# Patient Record
Sex: Female | Born: 1955 | Race: Black or African American | Hispanic: No | Marital: Married | Smoking: Never smoker
Health system: Southern US, Community
[De-identification: ages and names within clinical notes are randomized; demographics above are authoritative.]

## PROBLEM LIST (undated history)

## (undated) DIAGNOSIS — E785 Hyperlipidemia, unspecified: Secondary | ICD-10-CM

## (undated) DIAGNOSIS — I10 Essential (primary) hypertension: Secondary | ICD-10-CM

## (undated) DIAGNOSIS — Z8041 Family history of malignant neoplasm of ovary: Secondary | ICD-10-CM

## (undated) DIAGNOSIS — D869 Sarcoidosis, unspecified: Secondary | ICD-10-CM

## (undated) DIAGNOSIS — A6000 Herpesviral infection of urogenital system, unspecified: Secondary | ICD-10-CM

## (undated) DIAGNOSIS — K589 Irritable bowel syndrome without diarrhea: Secondary | ICD-10-CM

## (undated) DIAGNOSIS — Z1371 Encounter for nonprocreative screening for genetic disease carrier status: Secondary | ICD-10-CM

## (undated) DIAGNOSIS — Z803 Family history of malignant neoplasm of breast: Secondary | ICD-10-CM

## (undated) HISTORY — DX: Essential (primary) hypertension: I10

## (undated) HISTORY — DX: Irritable bowel syndrome without diarrhea: K58.9

## (undated) HISTORY — DX: Herpesviral infection of urogenital system, unspecified: A60.00

## (undated) HISTORY — PX: BACK SURGERY: SHX140

## (undated) HISTORY — PX: BUNIONECTOMY: SHX129

## (undated) HISTORY — DX: Family history of malignant neoplasm of breast: Z80.3

## (undated) HISTORY — PX: VAGINAL HYSTERECTOMY: SUR661

## (undated) HISTORY — PX: FOOT SURGERY: SHX648

## (undated) HISTORY — DX: Hyperlipidemia, unspecified: E78.5

## (undated) HISTORY — DX: Encounter for nonprocreative screening for genetic disease carrier status: Z13.71

## (undated) HISTORY — DX: Family history of malignant neoplasm of ovary: Z80.41

---

## 2005-04-28 ENCOUNTER — Ambulatory Visit: Payer: Self-pay | Admitting: Obstetrics and Gynecology

## 2006-06-22 ENCOUNTER — Ambulatory Visit: Payer: Self-pay | Admitting: Obstetrics and Gynecology

## 2006-06-30 ENCOUNTER — Other Ambulatory Visit: Payer: Self-pay

## 2006-07-06 ENCOUNTER — Ambulatory Visit: Payer: Self-pay | Admitting: Obstetrics and Gynecology

## 2006-08-01 ENCOUNTER — Ambulatory Visit: Payer: Self-pay | Admitting: Obstetrics and Gynecology

## 2007-06-21 ENCOUNTER — Ambulatory Visit: Payer: Self-pay | Admitting: Internal Medicine

## 2010-12-22 HISTORY — PX: COLONOSCOPY: SHX5424

## 2012-08-12 ENCOUNTER — Ambulatory Visit: Payer: Self-pay | Admitting: Internal Medicine

## 2012-08-24 ENCOUNTER — Ambulatory Visit: Payer: Self-pay | Admitting: Internal Medicine

## 2013-02-22 ENCOUNTER — Ambulatory Visit: Payer: Self-pay | Admitting: Family Medicine

## 2013-02-22 LAB — URINALYSIS, COMPLETE
Blood: NEGATIVE
Glucose,UR: NEGATIVE mg/dL (ref 0–75)
Ketone: NEGATIVE
Nitrite: NEGATIVE
Ph: 6 (ref 4.5–8.0)
Specific Gravity: 1.025 (ref 1.003–1.030)

## 2013-02-24 LAB — URINE CULTURE

## 2013-10-07 ENCOUNTER — Ambulatory Visit: Payer: Self-pay | Admitting: Family Medicine

## 2014-10-06 ENCOUNTER — Ambulatory Visit: Payer: Self-pay | Admitting: Obstetrics and Gynecology

## 2014-10-13 ENCOUNTER — Ambulatory Visit: Payer: Self-pay | Admitting: Obstetrics and Gynecology

## 2015-01-10 ENCOUNTER — Ambulatory Visit: Payer: Self-pay | Admitting: Family Medicine

## 2015-06-27 ENCOUNTER — Other Ambulatory Visit: Payer: Self-pay | Admitting: Family Medicine

## 2015-06-27 DIAGNOSIS — E785 Hyperlipidemia, unspecified: Secondary | ICD-10-CM

## 2015-06-27 DIAGNOSIS — I10 Essential (primary) hypertension: Secondary | ICD-10-CM

## 2015-06-27 DIAGNOSIS — B001 Herpesviral vesicular dermatitis: Secondary | ICD-10-CM

## 2015-08-01 ENCOUNTER — Other Ambulatory Visit: Payer: Self-pay | Admitting: Family Medicine

## 2015-08-01 DIAGNOSIS — B001 Herpesviral vesicular dermatitis: Secondary | ICD-10-CM

## 2015-08-01 DIAGNOSIS — E785 Hyperlipidemia, unspecified: Secondary | ICD-10-CM

## 2015-08-01 DIAGNOSIS — I10 Essential (primary) hypertension: Secondary | ICD-10-CM

## 2015-08-28 ENCOUNTER — Other Ambulatory Visit: Payer: Self-pay | Admitting: Family Medicine

## 2015-08-28 DIAGNOSIS — E785 Hyperlipidemia, unspecified: Secondary | ICD-10-CM

## 2015-08-28 DIAGNOSIS — Z8619 Personal history of other infectious and parasitic diseases: Secondary | ICD-10-CM

## 2015-08-28 DIAGNOSIS — I10 Essential (primary) hypertension: Secondary | ICD-10-CM

## 2015-09-03 ENCOUNTER — Other Ambulatory Visit: Payer: Self-pay | Admitting: Family Medicine

## 2015-09-18 ENCOUNTER — Ambulatory Visit (INDEPENDENT_AMBULATORY_CARE_PROVIDER_SITE_OTHER): Payer: BLUE CROSS/BLUE SHIELD | Admitting: Family Medicine

## 2015-09-18 ENCOUNTER — Encounter: Payer: Self-pay | Admitting: Family Medicine

## 2015-09-18 VITALS — BP 122/62 | HR 72 | Ht 64.0 in | Wt 155.0 lb

## 2015-09-18 DIAGNOSIS — I10 Essential (primary) hypertension: Secondary | ICD-10-CM | POA: Diagnosis not present

## 2015-09-18 DIAGNOSIS — N3090 Cystitis, unspecified without hematuria: Secondary | ICD-10-CM | POA: Diagnosis not present

## 2015-09-18 DIAGNOSIS — A6000 Herpesviral infection of urogenital system, unspecified: Secondary | ICD-10-CM

## 2015-09-18 DIAGNOSIS — A609 Anogenital herpesviral infection, unspecified: Secondary | ICD-10-CM | POA: Diagnosis not present

## 2015-09-18 DIAGNOSIS — E785 Hyperlipidemia, unspecified: Secondary | ICD-10-CM

## 2015-09-18 MED ORDER — PRAVASTATIN SODIUM 20 MG PO TABS: 20.0000 mg | ORAL_TABLET | Freq: Every day | ORAL | Status: DC

## 2015-09-18 MED ORDER — LISINOPRIL-HYDROCHLOROTHIAZIDE 20-25 MG PO TABS: ORAL_TABLET | ORAL | Status: DC

## 2015-09-18 MED ORDER — VALACYCLOVIR HCL 500 MG PO TABS: 500.0000 mg | ORAL_TABLET | Freq: Every day | ORAL | Status: DC

## 2015-09-18 NOTE — Progress Notes (Signed)
Name: Cheyenne Bean   MRN: [id number]    DOB: 07-20-1956   Date:09/18/2015       Progress Note  Subjective  Chief Complaint  Chief Complaint  Patient presents with  . Hypertension  . Hyperlipidemia  . genital herpes    HPI Comments: followup HSV 2/ suppression therapy  Hypertension This is a chronic problem. The current episode started more than 1 year ago. The problem has been gradually improving since onset. The problem is controlled. Pertinent negatives include no anxiety, blurred vision, chest pain, headaches, malaise/fatigue, neck pain, orthopnea, palpitations, peripheral edema, PND, shortness of breath or sweats. There are no associated agents to hypertension. Risk factors for coronary artery disease include dyslipidemia and post-menopausal state. Past treatments include ACE inhibitors and diuretics. The current treatment provides moderate improvement. There are no compliance problems.  There is no history of angina, kidney disease, CAD/MI, CVA, heart failure, left ventricular hypertrophy, PVD, renovascular disease or retinopathy. There is no history of chronic renal disease.  Hyperlipidemia This is a chronic problem. The current episode started more than 1 year ago. The problem is controlled. Recent lipid tests were reviewed and are normal. She has no history of chronic renal disease, diabetes, hypothyroidism, liver disease, obesity or nephrotic syndrome. Pertinent negatives include no chest pain, focal weakness, myalgias or shortness of breath. Current antihyperlipidemic treatment includes statins. The current treatment provides moderate improvement of lipids. There are no compliance problems.   Urinary Tract Infection  This is a new (follow up) problem. The current episode started in the past 7 days. There has been no fever. Pertinent negatives include no chills, discharge, flank pain, frequency, hematuria, hesitancy, nausea, possible pregnancy, sweats, urgency or vomiting.  Associated symptoms comments: Suprapubic pressure resolved.    No problem-specific assessment & plan notes found for this encounter.   Past Medical History  Diagnosis Date  . Hypertension   . Hyperlipidemia   . Genital herpes     Past Surgical History  Procedure Laterality Date  . Back surgery    . Vaginal hysterectomy    . Bunionectomy Left   . Colonoscopy  2012    cleared for 10 yrs    Family History  Problem Relation Age of Onset  . Hypertension Mother     Social History   Social History  . Marital Status: Married    Spouse Name: N/A  . Number of Children: N/A  . Years of Education: N/A   Occupational History  . Not on file.   Social History Main Topics  . Smoking status: Never Smoker   . Smokeless tobacco: Not on file  . Alcohol Use: No  . Drug Use: No  . Sexual Activity: Yes   Other Topics Concern  . Not on file   Social History Narrative  . No narrative on file    No Known Allergies   Review of Systems  Constitutional: Negative for fever, chills, weight loss and malaise/fatigue.  HENT: Negative for ear discharge, ear pain and sore throat.   Eyes: Negative for blurred vision.  Respiratory: Negative for cough, sputum production, shortness of breath and wheezing.   Cardiovascular: Negative for chest pain, palpitations, orthopnea, leg swelling and PND.  Gastrointestinal: Negative for heartburn, nausea, vomiting, abdominal pain, diarrhea, constipation, blood in stool and melena.  Genitourinary: Negative for dysuria, hesitancy, urgency, frequency, hematuria and flank pain.  Musculoskeletal: Negative for myalgias, back pain, joint pain and neck pain.  Skin: Negative for rash.  Neurological: Negative for dizziness, tingling,  sensory change, focal weakness and headaches.  Endo/Heme/Allergies: Negative for environmental allergies and polydipsia. Does not bruise/bleed easily.  Psychiatric/Behavioral: Negative for depression and suicidal ideas. The patient  is not nervous/anxious and does not have insomnia.      Objective  Filed Vitals:   09/18/15 0835  BP: 122/62  Pulse: 72  Height:  (1.626 m)  Weight: 155 lb (70.308 kg)    Physical Exam  Constitutional: She is well-developed, well-nourished, and in no distress. No distress.  HENT:  Head: Normocephalic and atraumatic.  Right Ear: External ear normal.  Left Ear: External ear normal.  Nose: Nose normal.  Mouth/Throat: Oropharynx is clear and moist.  Eyes: Conjunctivae and EOM are normal. Pupils are equal, round, and reactive to light. Right eye exhibits no discharge. Left eye exhibits no discharge.  Neck: Normal range of motion. Neck supple. No JVD present. No thyromegaly present.  Cardiovascular: Normal rate, regular rhythm, normal heart sounds and intact distal pulses.  Exam reveals no gallop and no friction rub.   No murmur heard. Pulmonary/Chest: Effort normal and breath sounds normal.  Abdominal: Soft. Bowel sounds are normal. She exhibits no mass. There is no tenderness. There is no guarding.  Musculoskeletal: Normal range of motion. She exhibits no edema.  Lymphadenopathy:    She has no cervical adenopathy.  Neurological: She is alert. She has normal reflexes.  Skin: Skin is warm and dry. She is not diaphoretic.  Psychiatric: Mood and affect normal.      Assessment & Plan  Problem List Items Addressed This Visit      Cardiovascular and Mediastinum   Essential hypertension - Primary   Relevant Medications   lisinopril-hydrochlorothiazide (PRINZIDE,ZESTORETIC) 20-25 MG per tablet   pravastatin (PRAVACHOL) 20 MG tablet   Other Relevant Orders   Renal Function Panel     Genitourinary   Recurrent genital herpes simplex   Relevant Medications   valACYclovir (VALTREX) 500 MG tablet     Other   Hyperlipidemia   Relevant Medications   lisinopril-hydrochlorothiazide (PRINZIDE,ZESTORETIC) 20-25 MG per tablet   pravastatin (PRAVACHOL) 20 MG tablet   Other  Relevant Orders   Lipid Profile    Other Visit Diagnoses    Cystitis        Relevant Orders    POCT Urinalysis Dipstick         Dr. Elizabeth Sauer [facility]  09/18/2015

## 2015-09-29 LAB — RENAL FUNCTION PANEL
ALBUMIN: 4.7 g/dL (ref 3.5–5.5)
BUN/Creatinine Ratio: 21 (ref 9–23)
BUN: 18 mg/dL (ref 6–24)
CO2: 24 mmol/L (ref 18–29)
CREATININE: 0.86 mg/dL (ref 0.57–1.00)
Calcium: 9.5 mg/dL (ref 8.7–10.2)
Chloride: 102 mmol/L (ref 97–108)
GFR calc Af Amer: 87 mL/min/{1.73_m2} (ref >59)
GFR, EST NON AFRICAN AMERICAN: 76 mL/min/{1.73_m2} (ref >59)
Glucose: 79 mg/dL (ref 65–99)
PHOSPHORUS: 3.6 mg/dL (ref 2.5–4.5)
Potassium: 3.9 mmol/L (ref 3.5–5.2)
Sodium: 144 mmol/L (ref 134–144)

## 2015-09-29 LAB — LIPID PANEL
CHOL/HDL RATIO: 3.3 ratio (ref 0.0–4.4)
Cholesterol, Total: 163 mg/dL (ref 100–199)
HDL: 50 mg/dL (ref >39)
LDL CALC: 98 mg/dL (ref 0–99)
TRIGLYCERIDES: 76 mg/dL (ref 0–149)
VLDL Cholesterol Cal: 15 mg/dL (ref 5–40)

## 2015-10-08 ENCOUNTER — Other Ambulatory Visit: Payer: Self-pay

## 2015-10-08 DIAGNOSIS — R42 Dizziness and giddiness: Secondary | ICD-10-CM

## 2015-10-08 MED ORDER — MECLIZINE HCL 32 MG PO TABS: 32.0000 mg | ORAL_TABLET | Freq: Three times a day (TID) | ORAL | Status: DC | PRN

## 2016-03-07 ENCOUNTER — Encounter: Payer: Self-pay | Admitting: Family Medicine

## 2016-03-07 ENCOUNTER — Ambulatory Visit (INDEPENDENT_AMBULATORY_CARE_PROVIDER_SITE_OTHER): Payer: BLUE CROSS/BLUE SHIELD | Admitting: Family Medicine

## 2016-03-07 VITALS — BP 114/70 | HR 79 | Ht 64.0 in | Wt 155.0 lb

## 2016-03-07 DIAGNOSIS — M79675 Pain in left toe(s): Secondary | ICD-10-CM | POA: Diagnosis not present

## 2016-03-07 DIAGNOSIS — E785 Hyperlipidemia, unspecified: Secondary | ICD-10-CM | POA: Diagnosis not present

## 2016-03-07 DIAGNOSIS — A6000 Herpesviral infection of urogenital system, unspecified: Secondary | ICD-10-CM

## 2016-03-07 DIAGNOSIS — I10 Essential (primary) hypertension: Secondary | ICD-10-CM | POA: Diagnosis not present

## 2016-03-07 DIAGNOSIS — A609 Anogenital herpesviral infection, unspecified: Secondary | ICD-10-CM

## 2016-03-07 DIAGNOSIS — S76312A Strain of muscle, fascia and tendon of the posterior muscle group at thigh level, left thigh, initial encounter: Secondary | ICD-10-CM

## 2016-03-07 DIAGNOSIS — S76012A Strain of muscle, fascia and tendon of left hip, initial encounter: Secondary | ICD-10-CM

## 2016-03-07 MED ORDER — PRAVASTATIN SODIUM 20 MG PO TABS: 20.0000 mg | ORAL_TABLET | Freq: Every day | ORAL | Status: DC

## 2016-03-07 MED ORDER — LISINOPRIL-HYDROCHLOROTHIAZIDE 20-25 MG PO TABS: ORAL_TABLET | ORAL | Status: DC

## 2016-03-07 MED ORDER — VALACYCLOVIR HCL 500 MG PO TABS: 500.0000 mg | ORAL_TABLET | Freq: Every day | ORAL | Status: DC

## 2016-03-07 MED ORDER — MELOXICAM 7.5 MG PO TABS: 7.5000 mg | ORAL_TABLET | Freq: Every day | ORAL | Status: DC

## 2016-03-07 NOTE — Progress Notes (Signed)
Name: Cheyenne Bean   MRN: [id number]    DOB: May 13, 1956   Date:03/07/2016       Progress Note  Subjective  Chief Complaint  Chief Complaint  Patient presents with  . Follow-up  . Hypertension  . Hyperlipidemia  . Numbness    Patient states that she has a few toes that are feeling numb.     Hypertension This is a chronic problem. The problem has been waxing and waning since onset. The problem is controlled. Pertinent negatives include no anxiety, blurred vision, chest pain, headaches, malaise/fatigue, neck pain, orthopnea, palpitations, peripheral edema, PND, shortness of breath or sweats. There are no associated agents to hypertension. There are no known risk factors for coronary artery disease. Past treatments include diuretics and ACE inhibitors. The current treatment provides moderate improvement. There are no compliance problems.  There is no history of angina, kidney disease, CAD/MI, CVA, heart failure, left ventricular hypertrophy, PVD, renovascular disease or retinopathy. There is no history of chronic renal disease or a hypertension causing med.  Hyperlipidemia This is a chronic problem. The problem is controlled. She has no history of chronic renal disease, diabetes, hypothyroidism, liver disease, obesity or nephrotic syndrome. There are no known factors aggravating her hyperlipidemia. Pertinent negatives include no chest pain, focal weakness, myalgias or shortness of breath. Current antihyperlipidemic treatment includes statins. There are no compliance problems.  There are no known risk factors for coronary artery disease.  Hip Pain  The pain is present in the left thigh and left hip. The quality of the pain is described as aching. The pain is moderate. The pain has been fluctuating since onset. She has tried nothing for the symptoms.  Toe Pain  The incident occurred more than 1 week ago. The pain is present in the left toes (2nd and third). The pain has been fluctuating since  onset. She has tried acetaminophen for the symptoms. The treatment provided no relief.    No problem-specific assessment & plan notes found for this encounter.   Past Medical History  Diagnosis Date  . Hypertension   . Hyperlipidemia   . Genital herpes     Past Surgical History  Procedure Laterality Date  . Back surgery    . Vaginal hysterectomy    . Bunionectomy Left   . Colonoscopy  2012    cleared for 10 yrs    Family History  Problem Relation Age of Onset  . Hypertension Mother     Social History   Social History  . Marital Status: Married    Spouse Name: N/A  . Number of Children: N/A  . Years of Education: N/A   Occupational History  . Not on file.   Social History Main Topics  . Smoking status: Never Smoker   . Smokeless tobacco: Not on file  . Alcohol Use: No  . Drug Use: No  . Sexual Activity: Yes   Other Topics Concern  . Not on file   Social History Narrative    No Known Allergies   Review of Systems  Constitutional: Negative for fever, chills, weight loss and malaise/fatigue.  HENT: Negative for ear discharge, ear pain and sore throat.   Eyes: Negative for blurred vision.  Respiratory: Negative for cough, sputum production, shortness of breath and wheezing.   Cardiovascular: Negative for chest pain, palpitations, orthopnea, leg swelling and PND.  Gastrointestinal: Negative for heartburn, nausea, abdominal pain, diarrhea, constipation, blood in stool and melena.  Genitourinary: Negative for dysuria, urgency, frequency and hematuria.  Musculoskeletal: Negative for myalgias, back pain, joint pain and neck pain.  Skin: Negative for rash.  Neurological: Negative for dizziness, sensory change, focal weakness and headaches.  Endo/Heme/Allergies: Negative for environmental allergies and polydipsia. Does not bruise/bleed easily.  Psychiatric/Behavioral: Negative for depression and suicidal ideas. The patient is not nervous/anxious and does not have  insomnia.      Objective  Filed Vitals:   03/07/16 0821  BP: 114/70  Pulse: 79  Height: 5\' 4"  (1.626 m)  Weight: 155 lb (70.308 kg)  SpO2: 97%    Physical Exam  Constitutional: She is well-developed, well-nourished, and in no distress. No distress.  HENT:  Head: Normocephalic and atraumatic.  Right Ear: External ear normal.  Left Ear: External ear normal.  Nose: Nose normal.  Mouth/Throat: Oropharynx is clear and moist.  Eyes: Conjunctivae and EOM are normal. Pupils are equal, round, and reactive to light. Right eye exhibits no discharge. Left eye exhibits no discharge.  Neck: Normal range of motion. Neck supple. No JVD present. No thyromegaly present.  Cardiovascular: Normal rate, regular rhythm, normal heart sounds and intact distal pulses.  Exam reveals no gallop and no friction rub.   No murmur heard. Pulmonary/Chest: Effort normal and breath sounds normal.  Abdominal: Soft. Bowel sounds are normal. She exhibits no mass. There is no tenderness. There is no guarding.  Musculoskeletal: Normal range of motion. She exhibits no edema.  Lymphadenopathy:    She has no cervical adenopathy.       Right: No inguinal adenopathy present.       Left: No inguinal adenopathy present.  Neurological: She is alert. She has normal reflexes.  Skin: Skin is warm and dry. She is not diaphoretic.  Psychiatric: Mood and affect normal.  Nursing note and vitals reviewed.     Assessment & Plan  Problem List Items Addressed This Visit      Cardiovascular and Mediastinum   Essential hypertension - Primary   Relevant Medications   lisinopril-hydrochlorothiazide (PRINZIDE,ZESTORETIC) 20-25 MG tablet   pravastatin (PRAVACHOL) 20 MG tablet   Other Relevant Orders   Renal Function Panel     Genitourinary   Recurrent genital herpes simplex     Other   Hyperlipidemia   Relevant Medications   lisinopril-hydrochlorothiazide (PRINZIDE,ZESTORETIC) 20-25 MG tablet   pravastatin (PRAVACHOL) 20  MG tablet   Other Relevant Orders   Lipid Profile    Other Visit Diagnoses    Psoas muscle strain, left, initial encounter        Relevant Medications    meloxicam (MOBIC) 7.5 MG tablet    Pain of toe of left foot        Relevant Orders    Ambulatory referral to Podiatry         Dr. Hayden Rasmusseneanna Jayesh Marbach [facility]  03/07/2016

## 2016-03-14 ENCOUNTER — Ambulatory Visit: Payer: BLUE CROSS/BLUE SHIELD | Admitting: Family Medicine

## 2016-04-09 ENCOUNTER — Other Ambulatory Visit: Payer: Self-pay | Admitting: Family Medicine

## 2016-04-09 DIAGNOSIS — Z1231 Encounter for screening mammogram for malignant neoplasm of breast: Secondary | ICD-10-CM

## 2016-04-10 ENCOUNTER — Ambulatory Visit
Admission: RE | Admit: 2016-04-10 | Discharge: 2016-04-10 | Disposition: A | Discharge status: Home / Self Care | Payer: BLUE CROSS/BLUE SHIELD | Source: Ambulatory Visit | Attending: Family Medicine | Admitting: Family Medicine

## 2016-04-10 DIAGNOSIS — Z1231 Encounter for screening mammogram for malignant neoplasm of breast: Secondary | ICD-10-CM | POA: Insufficient documentation

## 2016-05-22 ENCOUNTER — Other Ambulatory Visit: Payer: Self-pay | Admitting: Family Medicine

## 2016-05-23 ENCOUNTER — Ambulatory Visit (INDEPENDENT_AMBULATORY_CARE_PROVIDER_SITE_OTHER): Payer: BLUE CROSS/BLUE SHIELD | Admitting: Family Medicine

## 2016-05-23 ENCOUNTER — Encounter: Payer: Self-pay | Admitting: Family Medicine

## 2016-05-23 VITALS — BP 120/70 | HR 60 | Ht 64.0 in | Wt 158.0 lb

## 2016-05-23 DIAGNOSIS — R1032 Left lower quadrant pain: Secondary | ICD-10-CM

## 2016-05-23 DIAGNOSIS — R1030 Lower abdominal pain, unspecified: Secondary | ICD-10-CM

## 2016-05-23 LAB — LIPID PANEL
CHOLESTEROL TOTAL: 170 mg/dL (ref 100–199)
Chol/HDL Ratio: 3.1 ratio units (ref 0.0–4.4)
HDL: 54 mg/dL (ref >39)
LDL CALC: 105 mg/dL — AB (ref 0–99)
Triglycerides: 57 mg/dL (ref 0–149)
VLDL CHOLESTEROL CAL: 11 mg/dL (ref 5–40)

## 2016-05-23 LAB — RENAL FUNCTION PANEL
Albumin: 4.5 g/dL (ref 3.5–5.5)
BUN / CREAT RATIO: 25 — AB (ref 9–23)
BUN: 19 mg/dL (ref 6–24)
CALCIUM: 9.6 mg/dL (ref 8.7–10.2)
CHLORIDE: 100 mmol/L (ref 96–106)
CO2: 23 mmol/L (ref 18–29)
CREATININE: 0.76 mg/dL (ref 0.57–1.00)
GFR calc non Af Amer: 87 mL/min/{1.73_m2} (ref >59)
GFR, EST AFRICAN AMERICAN: 101 mL/min/{1.73_m2} (ref >59)
Glucose: 67 mg/dL (ref 65–99)
Phosphorus: 3.5 mg/dL (ref 2.5–4.5)
Potassium: 3.9 mmol/L (ref 3.5–5.2)
SODIUM: 142 mmol/L (ref 134–144)

## 2016-05-23 MED ORDER — LOVASTATIN 40 MG PO TABS: 40.0000 mg | ORAL_TABLET | Freq: Every day | ORAL | Status: DC

## 2016-05-23 NOTE — Addendum Note (Signed)
Addended by: Everitt AmberLYNCH, Melven Stockard L on: 05/23/2016 10:06 AM   Modules accepted: Orders

## 2016-05-23 NOTE — Progress Notes (Signed)
Name: Cheyenne Bean   MRN: [id number]    DOB: 1956-01-21   Date:05/23/2016       Progress Note  Subjective  Chief Complaint  Chief Complaint  Patient presents with  . Abdominal Pain    LUQ pain- has complained of this before- "don't use the bathroom regularly and went to the gynecologist"    Abdominal Pain This is a recurrent problem. The current episode started more than 1 year ago. The onset quality is gradual. The problem occurs daily (3-4 months). The problem has been unchanged. The pain is located in the LLQ. The pain is at a severity of 4/10. The pain is moderate. The quality of the pain is colicky. The abdominal pain does not radiate. Pertinent negatives include no anorexia, arthralgias, belching, constipation, diarrhea, dysuria, fever, flatus, frequency, headaches, hematochezia, hematuria, melena, myalgias, nausea, vomiting or weight loss. Nothing aggravates the pain. The pain is relieved by nothing. She has tried acetaminophen for the symptoms. The treatment provided no relief. Prior diagnostic workup includes lower endoscopy.  Hip Pain  The incident occurred more than 1 week ago. There was no injury mechanism. The pain is mild. The pain has been improving since onset. Pertinent negatives include no inability to bear weight, loss of motion, loss of sensation, muscle weakness, numbness or tingling. She has tried nothing for the symptoms.    No problem-specific assessment & plan notes found for this encounter.   Past Medical History  Diagnosis Date  . Hypertension   . Hyperlipidemia   . Genital herpes     Past Surgical History  Procedure Laterality Date  . Back surgery    . Vaginal hysterectomy    . Bunionectomy Left   . Colonoscopy  2012    cleared for 10 yrs    Family History  Problem Relation Age of Onset  . Hypertension Mother     Social History   Social History  . Marital Status: Married    Spouse Name: N/A  . Number of Children: N/A  . Years of  Education: N/A   Occupational History  . Not on file.   Social History Main Topics  . Smoking status: Never Smoker   . Smokeless tobacco: Not on file  . Alcohol Use: No  . Drug Use: No  . Sexual Activity: Yes   Other Topics Concern  . Not on file   Social History Narrative    No Known Allergies   Review of Systems  Constitutional: Negative for fever, chills, weight loss and malaise/fatigue.  HENT: Negative for ear discharge, ear pain and sore throat.   Eyes: Negative for blurred vision.  Respiratory: Negative for cough, sputum production, shortness of breath and wheezing.   Cardiovascular: Negative for chest pain, palpitations and leg swelling.  Gastrointestinal: Positive for abdominal pain. Negative for heartburn, nausea, vomiting, diarrhea, constipation, blood in stool, melena, hematochezia, anorexia and flatus.  Genitourinary: Negative for dysuria, urgency, frequency and hematuria.  Musculoskeletal: Negative for myalgias, back pain, joint pain, arthralgias and neck pain.  Skin: Negative for rash.  Neurological: Negative for dizziness, tingling, sensory change, focal weakness, numbness and headaches.  Endo/Heme/Allergies: Negative for environmental allergies and polydipsia. Does not bruise/bleed easily.  Psychiatric/Behavioral: Negative for depression and suicidal ideas. The patient is not nervous/anxious and does not have insomnia.      Objective  Filed Vitals:   05/23/16 0904  BP: 120/70  Pulse: 60  Height:  (1.626 m)  Weight: 158 lb (71.668 kg)  Physical Exam  Constitutional: She is well-developed, well-nourished, and in no distress. No distress.  HENT:  Head: Normocephalic and atraumatic.  Right Ear: External ear normal.  Left Ear: External ear normal.  Nose: Nose normal.  Mouth/Throat: Oropharynx is clear and moist.  Eyes: Conjunctivae and EOM are normal. Pupils are equal, round, and reactive to light. Right eye exhibits no discharge. Left eye  exhibits no discharge.  Neck: Normal range of motion. Neck supple. No JVD present. No thyromegaly present.  Cardiovascular: Normal rate, regular rhythm, normal heart sounds and intact distal pulses.  Exam reveals no gallop and no friction rub.   No murmur heard. Pulmonary/Chest: Effort normal and breath sounds normal. She has no wheezes. She has no rales.  Abdominal: Soft. Bowel sounds are normal. She exhibits no mass. There is no hepatosplenomegaly. There is no tenderness. There is no rigidity, no rebound, no guarding, no CVA tenderness, no tenderness at McBurney's point and negative Murphy's sign.  Musculoskeletal: Normal range of motion. She exhibits no edema.  Lymphadenopathy:    She has no cervical adenopathy.  Neurological: She is alert. She has normal reflexes.  Skin: Skin is warm and dry. She is not diaphoretic.  Psychiatric: Mood and affect normal.  Nursing note and vitals reviewed.     Assessment & Plan  Problem List Items Addressed This Visit    None    Visit Diagnoses    Left lower quadrant pain    -  Primary    Relevant Orders    US Abdomen Complete    US Transvaginal Non-OB    Deep left inguinal pain        Relevant Orders    DG HIP UNILAT WITH PELVIS 1V LEFT    US Abdomen Complete    US Pelvis Complete    US Transvaginal Non-OB         Dr. Hayden Rasmusseneanna Mart [facility]s [facility]  05/23/2016

## 2016-06-02 ENCOUNTER — Ambulatory Visit
Admission: RE | Admit: 2016-06-02 | Discharge: 2016-06-02 | Disposition: A | Discharge status: Home / Self Care | Payer: BLUE CROSS/BLUE SHIELD | Source: Ambulatory Visit | Attending: Family Medicine | Admitting: Family Medicine

## 2016-06-02 DIAGNOSIS — R1032 Left lower quadrant pain: Secondary | ICD-10-CM

## 2016-06-03 ENCOUNTER — Ambulatory Visit
Admission: RE | Admit: 2016-06-03 | Discharge: 2016-06-03 | Disposition: A | Discharge status: Home / Self Care | Payer: BLUE CROSS/BLUE SHIELD | Source: Ambulatory Visit | Attending: Family Medicine | Admitting: Family Medicine

## 2016-06-03 DIAGNOSIS — N838 Other noninflammatory disorders of ovary, fallopian tube and broad ligament: Secondary | ICD-10-CM | POA: Insufficient documentation

## 2016-06-03 DIAGNOSIS — Z90710 Acquired absence of both cervix and uterus: Secondary | ICD-10-CM | POA: Insufficient documentation

## 2016-06-03 DIAGNOSIS — R1030 Lower abdominal pain, unspecified: Secondary | ICD-10-CM | POA: Diagnosis present

## 2016-06-03 DIAGNOSIS — R1032 Left lower quadrant pain: Secondary | ICD-10-CM | POA: Diagnosis present

## 2016-07-14 ENCOUNTER — Other Ambulatory Visit: Payer: Self-pay

## 2016-07-14 DIAGNOSIS — R42 Dizziness and giddiness: Secondary | ICD-10-CM

## 2016-07-14 MED ORDER — MECLIZINE HCL 25 MG PO TABS: 25.0000 mg | ORAL_TABLET | Freq: Three times a day (TID) | ORAL | 0 refills | Status: DC | PRN

## 2016-08-28 ENCOUNTER — Other Ambulatory Visit: Payer: Self-pay | Admitting: Family Medicine

## 2016-08-28 DIAGNOSIS — E785 Hyperlipidemia, unspecified: Secondary | ICD-10-CM

## 2016-08-28 DIAGNOSIS — A6000 Herpesviral infection of urogenital system, unspecified: Secondary | ICD-10-CM

## 2016-08-29 NOTE — Telephone Encounter (Signed)
Patient scheduled her appt on 9/29 @ 915 am for med refill

## 2016-09-19 ENCOUNTER — Ambulatory Visit: Payer: BLUE CROSS/BLUE SHIELD | Admitting: Family Medicine

## 2016-11-03 ENCOUNTER — Other Ambulatory Visit: Payer: Self-pay

## 2016-11-03 DIAGNOSIS — R109 Unspecified abdominal pain: Secondary | ICD-10-CM

## 2016-11-04 ENCOUNTER — Ambulatory Visit (INDEPENDENT_AMBULATORY_CARE_PROVIDER_SITE_OTHER): Payer: BLUE CROSS/BLUE SHIELD | Admitting: Family Medicine

## 2016-11-04 ENCOUNTER — Encounter: Payer: Self-pay | Admitting: Family Medicine

## 2016-11-04 VITALS — BP 120/80 | HR 90 | Ht 64.0 in | Wt 157.0 lb

## 2016-11-04 DIAGNOSIS — I10 Essential (primary) hypertension: Secondary | ICD-10-CM

## 2016-11-04 DIAGNOSIS — R69 Illness, unspecified: Secondary | ICD-10-CM

## 2016-11-04 DIAGNOSIS — E7800 Pure hypercholesterolemia, unspecified: Secondary | ICD-10-CM | POA: Diagnosis not present

## 2016-11-04 MED ORDER — PRAVASTATIN SODIUM 20 MG PO TABS: 20.0000 mg | ORAL_TABLET | Freq: Every day | ORAL | 1 refills | Status: DC

## 2016-11-04 MED ORDER — LISINOPRIL-HYDROCHLOROTHIAZIDE 20-25 MG PO TABS: ORAL_TABLET | ORAL | 1 refills | Status: DC

## 2016-11-04 NOTE — Progress Notes (Signed)
Name: Cheyenne Bean   MRN: [id number]    DOB: 10/11/1956   Date:11/04/2016       Progress Note  Subjective  Chief Complaint  Chief Complaint  Patient presents with  . Hypertension  . Hyperlipidemia    Hypertension  This is a new problem. The current episode started more than 1 year ago. The problem is unchanged. The problem is controlled. Pertinent negatives include no anxiety, blurred vision, chest pain, headaches, malaise/fatigue, neck pain, orthopnea, palpitations, peripheral edema, PND, shortness of breath or sweats. There are no associated agents to hypertension. There are no known risk factors for coronary artery disease. Past treatments include ACE inhibitors and diuretics. The current treatment provides mild improvement. There are no compliance problems.  There is no history of angina, kidney disease, CAD/MI, CVA, heart failure, left ventricular hypertrophy, PVD, renovascular disease or retinopathy. There is no history of chronic renal disease or a hypertension causing med.  Hyperlipidemia  This is a chronic problem. The current episode started more than 1 year ago. The problem is controlled. Recent lipid tests were reviewed and are normal. She has no history of chronic renal disease. There are no known factors aggravating her hyperlipidemia. Pertinent negatives include no chest pain, focal weakness, myalgias or shortness of breath. She is currently on no antihyperlipidemic treatment. The current treatment provides mild improvement of lipids. There are no compliance problems.  Risk factors for coronary artery disease include dyslipidemia and hypertension.    No problem-specific Assessment & Plan notes found for this encounter.   Past Medical History:  Diagnosis Date  . Genital herpes   . Hyperlipidemia   . Hypertension     Past Surgical History:  Procedure Laterality Date  . BACK SURGERY    . BUNIONECTOMY Left   . COLONOSCOPY  2012   cleared for 10 yrs  . VAGINAL  HYSTERECTOMY      Family History  Problem Relation Age of Onset  . Hypertension Mother     Social History   Social History  . Marital status: Married    Spouse name: N/A  . Number of children: N/A  . Years of education: N/A   Occupational History  . Not on file.   Social History Main Topics  . Smoking status: Never Smoker  . Smokeless tobacco: Not on file  . Alcohol use No  . Drug use: No  . Sexual activity: Yes   Other Topics Concern  . Not on file   Social History Narrative  . No narrative on file    No Known Allergies   Review of Systems  Constitutional: Negative for chills, fever, malaise/fatigue and weight loss.  HENT: Negative for ear discharge, ear pain and sore throat.   Eyes: Negative for blurred vision.  Respiratory: Negative for cough, sputum production, shortness of breath and wheezing.   Cardiovascular: Negative for chest pain, palpitations, orthopnea, leg swelling and PND.  Gastrointestinal: Negative for blood in stool, constipation, diarrhea, heartburn, melena and nausea.  Genitourinary: Negative for dysuria, frequency, hematuria and urgency.  Musculoskeletal: Negative for back pain, joint pain, myalgias and neck pain.  Skin: Negative for rash.  Neurological: Negative for dizziness, tingling, sensory change, focal weakness and headaches.  Endo/Heme/Allergies: Negative for environmental allergies and polydipsia. Does not bruise/bleed easily.  Psychiatric/Behavioral: Negative for depression and suicidal ideas. The patient is not nervous/anxious and does not have insomnia.      Objective  Vitals:   11/04/16 0828  BP: 120/80  Pulse: 90  Weight:  157 lb (71.2 kg)  Height: 5\' 4"  (1.626 m)    Physical Exam  Constitutional: She is well-developed, well-nourished, and in no distress. No distress.  HENT:  Head: Normocephalic and atraumatic.  Right Ear: External ear normal.  Left Ear: External ear normal.  Nose: Nose normal.  Mouth/Throat:  Oropharynx is clear and moist.  Eyes: Conjunctivae and EOM are normal. Pupils are equal, round, and reactive to light. Right eye exhibits no discharge. Left eye exhibits no discharge.  Neck: Normal range of motion. Neck supple. No JVD present. No thyromegaly present.  Cardiovascular: Normal rate, regular rhythm, normal heart sounds and intact distal pulses.  Exam reveals no gallop and no friction rub.   No murmur heard. Pulmonary/Chest: Effort normal and breath sounds normal. She has no wheezes. She has no rales.  Abdominal: Soft. Bowel sounds are normal. She exhibits no mass. There is no hepatosplenomegaly. There is no guarding.  Musculoskeletal: Normal range of motion. She exhibits no edema.  Lymphadenopathy:    She has no cervical adenopathy.  Neurological: She is alert. She has normal reflexes.  Skin: Skin is warm and dry. She is not diaphoretic.  Psychiatric: Mood and affect normal.  Nursing note and vitals reviewed.     Assessment & Plan  Problem List Items Addressed This Visit      Cardiovascular and Mediastinum   Essential hypertension - Primary   Relevant Medications   pravastatin (PRAVACHOL) 20 MG tablet   lisinopril-hydrochlorothiazide (PRINZIDE,ZESTORETIC) 20-25 MG tablet     Other   Pure hypercholesterolemia   Relevant Medications   pravastatin (PRAVACHOL) 20 MG tablet   lisinopril-hydrochlorothiazide (PRINZIDE,ZESTORETIC) 20-25 MG tablet    Other Visit Diagnoses    Taking medication for chronic disease       Relevant Orders   Hepatic function panel        Dr. Hayden Rasmusseneanna Jones [facility]  11/04/16

## 2016-11-05 ENCOUNTER — Other Ambulatory Visit: Payer: Self-pay | Admitting: Gastroenterology

## 2016-11-05 DIAGNOSIS — R1032 Left lower quadrant pain: Secondary | ICD-10-CM

## 2016-11-05 LAB — HEPATIC FUNCTION PANEL
ALBUMIN: 4.5 g/dL (ref 3.5–5.5)
ALT: 25 IU/L (ref 0–32)
AST: 21 IU/L (ref 0–40)
Alkaline Phosphatase: 81 IU/L (ref 39–117)
BILIRUBIN TOTAL: 0.2 mg/dL (ref 0.0–1.2)
Bilirubin, Direct: 0.09 mg/dL (ref 0.00–0.40)
Total Protein: 7.8 g/dL (ref 6.0–8.5)

## 2016-11-11 ENCOUNTER — Ambulatory Visit
Admission: RE | Admit: 2016-11-11 | Discharge: 2016-11-11 | Disposition: A | Discharge status: Home / Self Care | Payer: BLUE CROSS/BLUE SHIELD | Source: Ambulatory Visit | Attending: Gastroenterology | Admitting: Gastroenterology

## 2016-11-11 DIAGNOSIS — K4020 Bilateral inguinal hernia, without obstruction or gangrene, not specified as recurrent: Secondary | ICD-10-CM | POA: Insufficient documentation

## 2016-11-11 DIAGNOSIS — R1032 Left lower quadrant pain: Secondary | ICD-10-CM | POA: Insufficient documentation

## 2016-11-11 DIAGNOSIS — Z90710 Acquired absence of both cervix and uterus: Secondary | ICD-10-CM | POA: Diagnosis not present

## 2016-11-11 MED ORDER — IOPAMIDOL (ISOVUE-300) INJECTION 61%
100.0000 mL | Freq: Once | INTRAVENOUS | Status: AC | PRN
  Administered 2016-11-11: 100 mL via INTRAVENOUS

## 2016-11-17 ENCOUNTER — Other Ambulatory Visit: Payer: Self-pay

## 2017-01-02 ENCOUNTER — Ambulatory Visit (INDEPENDENT_AMBULATORY_CARE_PROVIDER_SITE_OTHER): Payer: BLUE CROSS/BLUE SHIELD

## 2017-01-02 DIAGNOSIS — Z23 Encounter for immunization: Secondary | ICD-10-CM

## 2017-01-12 ENCOUNTER — Other Ambulatory Visit: Payer: Self-pay | Admitting: Obstetrics and Gynecology

## 2017-01-12 DIAGNOSIS — N644 Mastodynia: Secondary | ICD-10-CM

## 2017-01-20 ENCOUNTER — Other Ambulatory Visit: Payer: Self-pay | Admitting: Family Medicine

## 2017-01-20 DIAGNOSIS — A6000 Herpesviral infection of urogenital system, unspecified: Secondary | ICD-10-CM

## 2017-01-22 ENCOUNTER — Ambulatory Visit
Admission: RE | Admit: 2017-01-22 | Discharge: 2017-01-22 | Disposition: A | Discharge status: Home / Self Care | Payer: BLUE CROSS/BLUE SHIELD | Source: Ambulatory Visit | Attending: Obstetrics and Gynecology | Admitting: Obstetrics and Gynecology

## 2017-01-22 DIAGNOSIS — Z1371 Encounter for nonprocreative screening for genetic disease carrier status: Secondary | ICD-10-CM

## 2017-01-22 DIAGNOSIS — N644 Mastodynia: Secondary | ICD-10-CM

## 2017-01-22 HISTORY — DX: Encounter for nonprocreative screening for genetic disease carrier status: Z13.71

## 2017-02-20 DIAGNOSIS — K589 Irritable bowel syndrome without diarrhea: Secondary | ICD-10-CM

## 2017-02-20 HISTORY — DX: Irritable bowel syndrome, unspecified: K58.9

## 2017-02-26 ENCOUNTER — Encounter: Payer: Self-pay | Admitting: *Deleted

## 2017-02-27 ENCOUNTER — Ambulatory Visit
Admission: RE | Admit: 2017-02-27 | Discharge: 2017-02-27 | Disposition: A | Discharge status: Home / Self Care | Payer: BLUE CROSS/BLUE SHIELD | Source: Ambulatory Visit | Attending: Gastroenterology | Admitting: Gastroenterology

## 2017-02-27 ENCOUNTER — Encounter
Admission: RE | Disposition: A | Discharge status: Home / Self Care | Payer: Self-pay | Source: Ambulatory Visit | Attending: Gastroenterology

## 2017-02-27 ENCOUNTER — Ambulatory Visit: Payer: BLUE CROSS/BLUE SHIELD | Admitting: Anesthesiology

## 2017-02-27 ENCOUNTER — Encounter: Payer: Self-pay | Admitting: *Deleted

## 2017-02-27 DIAGNOSIS — Z86010 Personal history of colonic polyps: Secondary | ICD-10-CM | POA: Diagnosis not present

## 2017-02-27 DIAGNOSIS — E785 Hyperlipidemia, unspecified: Secondary | ICD-10-CM | POA: Diagnosis not present

## 2017-02-27 DIAGNOSIS — D869 Sarcoidosis, unspecified: Secondary | ICD-10-CM | POA: Insufficient documentation

## 2017-02-27 DIAGNOSIS — Q438 Other specified congenital malformations of intestine: Secondary | ICD-10-CM | POA: Diagnosis present

## 2017-02-27 DIAGNOSIS — I10 Essential (primary) hypertension: Secondary | ICD-10-CM | POA: Diagnosis not present

## 2017-02-27 HISTORY — PX: COLONOSCOPY WITH PROPOFOL: SHX5780

## 2017-02-27 HISTORY — DX: Sarcoidosis, unspecified: D86.9

## 2017-02-27 SURGERY — COLONOSCOPY WITH PROPOFOL
Anesthesia: General

## 2017-02-27 MED ORDER — LIDOCAINE HCL (CARDIAC) 20 MG/ML IV SOLN
INTRAVENOUS | Status: DC | PRN
  Administered 2017-02-27: 30 mg via INTRAVENOUS

## 2017-02-27 MED ORDER — PROPOFOL 500 MG/50ML IV EMUL
INTRAVENOUS | Status: AC
  Filled 2017-02-27: qty 50

## 2017-02-27 MED ORDER — LIDOCAINE HCL (PF) 2 % IJ SOLN
INTRAMUSCULAR | Status: AC
  Filled 2017-02-27: qty 2

## 2017-02-27 MED ORDER — FENTANYL CITRATE (PF) 100 MCG/2ML IJ SOLN: 25.0000 ug | INTRAMUSCULAR | Status: DC | PRN

## 2017-02-27 MED ORDER — PHENYLEPHRINE HCL 10 MG/ML IJ SOLN
INTRAMUSCULAR | Status: DC | PRN
  Administered 2017-02-27 (×3): 100 ug via INTRAVENOUS

## 2017-02-27 MED ORDER — SODIUM CHLORIDE 0.9 % IV SOLN: INTRAVENOUS | Status: DC

## 2017-02-27 MED ORDER — ONDANSETRON HCL 4 MG/2ML IJ SOLN: 4.0000 mg | Freq: Once | INTRAMUSCULAR | Status: DC | PRN

## 2017-02-27 MED ORDER — PROPOFOL 500 MG/50ML IV EMUL
INTRAVENOUS | Status: DC | PRN
  Administered 2017-02-27: 125 ug/kg/min via INTRAVENOUS

## 2017-02-27 MED ORDER — SODIUM CHLORIDE 0.9 % IV SOLN
INTRAVENOUS | Status: DC
  Administered 2017-02-27: 08:00:00 via INTRAVENOUS
  Administered 2017-02-27: 1000 mL via INTRAVENOUS

## 2017-02-27 MED ORDER — PROPOFOL 10 MG/ML IV BOLUS
INTRAVENOUS | Status: DC | PRN
  Administered 2017-02-27 (×2): 50 mg via INTRAVENOUS

## 2017-02-27 NOTE — Anesthesia Postprocedure Evaluation (Signed)
Anesthesia Post Note  Patient: Cheyenne Bean  Procedure(s) Performed: Procedure(s) (LRB): COLONOSCOPY WITH PROPOFOL (N/A)  Patient location during evaluation: PACU Anesthesia Type: General Level of consciousness: awake and alert and oriented Pain management: pain level controlled Vital Signs Assessment: post-procedure vital signs reviewed and stable Respiratory status: spontaneous breathing Cardiovascular status: blood pressure returned to baseline Anesthetic complications: no     Last Vitals:  Vitals:   02/27/17 0927 02/27/17 0937  BP: 108/85 109/74  Pulse: 78 74  Resp: 15 12  Temp:      Last Pain:  Vitals:   02/27/17 0907  TempSrc: Tympanic                 Cassius Cullinane

## 2017-02-27 NOTE — Transfer of Care (Signed)
Immediate Anesthesia Transfer of Care Note  Patient: Cheyenne Roughndrea Denise Bean  Procedure(s) Performed: Procedure(s): COLONOSCOPY WITH PROPOFOL (N/A)  Patient Location: PACU and Endoscopy Unit  Anesthesia Type:General  Level of Consciousness: awake, alert  and oriented  Airway & Oxygen Therapy: Patient Spontanous Breathing and Patient connected to nasal cannula oxygen  Post-op Assessment: Report given to RN and Post -op Vital signs reviewed and stable  Post vital signs: Reviewed and stable  Last Vitals:  Vitals:   02/27/17 0752  BP: (!) 137/91  Pulse: 78  Resp: 18  Temp: (!) 36.1 C    Last Pain:  Vitals:   02/27/17 0752  TempSrc: Tympanic         Complications: No apparent anesthesia complications

## 2017-02-27 NOTE — Anesthesia Post-op Follow-up Note (Cosign Needed)
Anesthesia QCDR form completed.        

## 2017-02-27 NOTE — Anesthesia Preprocedure Evaluation (Signed)
Anesthesia Evaluation  Patient identified by MRN, date of birth, ID band Patient awake    Reviewed: Allergy & Precautions, NPO status , Patient's Chart, lab work & pertinent test results  Airway Mallampati: II       Dental   Pulmonary    Pulmonary exam normal        Cardiovascular hypertension, Pt. on medications Normal cardiovascular exam     Neuro/Psych negative neurological ROS  negative psych ROS   GI/Hepatic Neg liver ROS, Hx of colon polyps   Endo/Other  negative endocrine ROS  Renal/GU negative Renal ROS     Musculoskeletal negative musculoskeletal ROS (+)   Abdominal Normal abdominal exam  (+)   Peds negative pediatric ROS (+)  Hematology negative hematology ROS (+)   Anesthesia Other Findings Past Medical History: No date: Genital herpes No date: Hyperlipidemia No date: Hypertension No date: Sarcoidosis (HCC)  Reproductive/Obstetrics                             Anesthesia Physical Anesthesia Plan  ASA: III  Anesthesia Plan: General   Post-op Pain Management:    Induction: Intravenous  Airway Management Planned: Nasal Cannula  Additional Equipment:   Intra-op Plan:   Post-operative Plan:   Informed Consent: I have reviewed the patients History and Physical, chart, labs and discussed the procedure including the risks, benefits and alternatives for the proposed anesthesia with the patient or authorized representative who has indicated his/her understanding and acceptance.     Plan Discussed with: CRNA and Surgeon  Anesthesia Plan Comments:         Anesthesia Quick Evaluation

## 2017-02-27 NOTE — Op Note (Addendum)
[facility] Gastroenterology Patient Name: Cheyenne Bean Procedure Date: 02/27/2017 8:08 AM MRN: [id number] Account #: [id number] Date of Birth: 02/23/1956 Admit Type: Outpatient Age: [age] [facility] Gender: Female Note Status: Finalized Procedure:            Colonoscopy Providers:            Christena DeemMartin U. Skulskie, MD Referring MD:         Duanne Limerickeanna C. Jones, MD (Referring MD) Medicines:            Monitored Anesthesia Care Complications:        No immediate complications. Procedure:            Pre-Anesthesia Assessment:                       - ASA Grade Assessment: III - A patient with severe                        systemic disease.                       After obtaining informed consent, the colonoscope was                        passed under direct vision. Throughout the procedure,                        the patient's blood pressure, pulse, and oxygen                        saturations were monitored continuously. The                        Colonoscope was introduced through the anus and                        advanced to the the cecum, identified by appendiceal                        orifice and ileocecal valve. The colonoscopy was                        unusually difficult due to a tortuous colon. Successful                        completion of the procedure was aided by changing the                        patient to a supine position, changing the patient to a                        prone position and using manual pressure. The patient                        tolerated the procedure well. The quality of the bowel                        preparation was good except the ascending colon was  fair. Findings:      The sigmoid colon, descending colon, transverse colon, hepatic flexure       and ascending colon were significantly redundant.      The retroflexed view of the distal rectum and anal verge was normal and       showed no anal or  rectal abnormalities. Impression:           - Redundant colon.                       - No specimens collected. Recommendation:       - Discharge patient to home. Procedure Code(s):    --- Professional ---                       [phone], Colonoscopy, flexible; diagnostic, including                        collection of specimen(s) by brushing or washing, when                        performed (separate procedure) Diagnosis Code(s):    --- Professional ---                       Q43.8, Other specified congenital malformations of                        intestine CPT copyright 2016 American Medical Association. All rights reserved. The codes documented in this report are preliminary and upon coder review may  be revised to meet current compliance requirements. Christena Deem, MD 02/27/2017 9:08:02 AM This report has been signed electronically. Number of Addenda: 0 Note Initiated On: 02/27/2017 8:08 AM Scope Withdrawal Time: 0 hours 7 minutes 23 seconds  Total Procedure Duration: 0 hours 36 minutes 58 seconds       [facility]

## 2017-02-27 NOTE — H&P (Signed)
Outpatient short stay form Pre-procedure 02/27/2017 8:11 AM [name] MD  Primary Physician: Dr. Elizabeth Sauereanna Jones  Reason for visit:  Colonoscopy  History of present illness:  Patient is a 61 year old female presenting today as above. She has a personal history of adenomatous colon polyps  She has also been having some intermittent left lower quadrant pain. She had a CT scan on 11/11/2016 which showed previous hysterectomy no acute processes in the abdomen and a bilateral fat-containing inguinal hernia. Is also been seen by GYN. There was some concern about the appearance of her left ovary that apparently has been resolved per patient. She tolerated her prep well. She takes no aspirin or blood thinning agents.    Current Facility-Administered Medications:  .  0.9 %  sodium chloride infusion, , Intravenous, Continuous, [name], MD, Last Rate: 20 mL/hr at 02/27/17 0808, 1,000 mL at 02/27/17 0808 .  0.9 %  sodium chloride infusion, , Intravenous, Continuous, [name], MD  Prescriptions Prior to Admission  Medication Sig Dispense Refill Last Dose  . lisinopril-hydrochlorothiazide (PRINZIDE,ZESTORETIC) 20-25 MG tablet TAKE ONE TABLET BY MOUTH ONCE DAILY NEED  TO  SCHEDULE  AN  APPOINTMENT 90 tablet 1   . meclizine (ANTIVERT) 25 MG tablet Take 1 tablet (25 mg total) by mouth 3 (three) times daily as needed for dizziness. 30 tablet 0 Taking  . meloxicam (MOBIC) 7.5 MG tablet Take 1 tablet (7.5 mg total) by mouth daily. 30 tablet 0 Taking  . pravastatin (PRAVACHOL) 20 MG tablet Take 1 tablet (20 mg total) by mouth daily. 90 tablet 1   . Turmeric 500 MG CAPS Take by mouth.   Taking  . valACYclovir (VALTREX) 500 MG tablet TAKE ONE CAPLET BY MOUTH ONCE DAILY SCHEDULE  AN  APPOINTMENT 30 tablet 0      No Known Allergies   Past Medical History:  Diagnosis Date  . Genital herpes   . Hyperlipidemia   . Hypertension   . Sarcoidosis (HCC)     Review of systems:       Physical Exam    Heart and lungs: Regular rate and rhythm without rub or gallop, lungs are bilaterally clear    HEENT: Normocephalic atraumatic eyes are anicteric    Other:     Pertinant exam for procedure: Soft nontender nondistended bowel sounds positive normoactive.    Planned proceedures: Colonoscopy and indicated procedures. I have discussed the risks benefits and complications of procedures to include not limited to bleeding, infection, perforation and the risk of sedation and the patient wishes to proceed.    [name], MD Gastroenterology 02/27/2017  8:11 AM

## 2017-03-02 ENCOUNTER — Encounter: Payer: Self-pay | Admitting: Gastroenterology

## 2017-03-07 ENCOUNTER — Other Ambulatory Visit: Payer: Self-pay | Admitting: Family Medicine

## 2017-03-07 DIAGNOSIS — I10 Essential (primary) hypertension: Secondary | ICD-10-CM

## 2017-03-31 ENCOUNTER — Other Ambulatory Visit: Payer: Self-pay | Admitting: Family Medicine

## 2017-03-31 DIAGNOSIS — A6000 Herpesviral infection of urogenital system, unspecified: Secondary | ICD-10-CM

## 2017-04-20 ENCOUNTER — Other Ambulatory Visit: Payer: Self-pay | Admitting: Family Medicine

## 2017-04-20 DIAGNOSIS — Z1231 Encounter for screening mammogram for malignant neoplasm of breast: Secondary | ICD-10-CM

## 2017-04-22 ENCOUNTER — Other Ambulatory Visit: Payer: Self-pay | Admitting: Family Medicine

## 2017-04-22 ENCOUNTER — Encounter (INDEPENDENT_AMBULATORY_CARE_PROVIDER_SITE_OTHER): Payer: Self-pay

## 2017-04-22 ENCOUNTER — Ambulatory Visit
Admission: RE | Admit: 2017-04-22 | Discharge: 2017-04-22 | Disposition: A | Discharge status: Home / Self Care | Payer: BLUE CROSS/BLUE SHIELD | Source: Ambulatory Visit | Attending: Family Medicine | Admitting: Family Medicine

## 2017-04-22 DIAGNOSIS — Z1231 Encounter for screening mammogram for malignant neoplasm of breast: Secondary | ICD-10-CM

## 2017-04-22 LAB — HM MAMMOGRAPHY: HM MAMMO: NORMAL (ref 0–4)

## 2017-05-05 ENCOUNTER — Encounter: Payer: Self-pay | Admitting: Family Medicine

## 2017-05-05 ENCOUNTER — Ambulatory Visit (INDEPENDENT_AMBULATORY_CARE_PROVIDER_SITE_OTHER): Payer: BLUE CROSS/BLUE SHIELD | Admitting: Family Medicine

## 2017-05-05 VITALS — BP 120/80 | HR 80 | Ht 64.0 in | Wt 159.0 lb

## 2017-05-05 DIAGNOSIS — A6000 Herpesviral infection of urogenital system, unspecified: Secondary | ICD-10-CM

## 2017-05-05 DIAGNOSIS — F419 Anxiety disorder, unspecified: Secondary | ICD-10-CM | POA: Diagnosis not present

## 2017-05-05 DIAGNOSIS — R42 Dizziness and giddiness: Secondary | ICD-10-CM

## 2017-05-05 DIAGNOSIS — I10 Essential (primary) hypertension: Secondary | ICD-10-CM | POA: Diagnosis not present

## 2017-05-05 DIAGNOSIS — F32A Depression, unspecified: Secondary | ICD-10-CM

## 2017-05-05 DIAGNOSIS — F329 Major depressive disorder, single episode, unspecified: Secondary | ICD-10-CM

## 2017-05-05 DIAGNOSIS — E7800 Pure hypercholesterolemia, unspecified: Secondary | ICD-10-CM | POA: Diagnosis not present

## 2017-05-05 DIAGNOSIS — M542 Cervicalgia: Secondary | ICD-10-CM

## 2017-05-05 MED ORDER — PRAVASTATIN SODIUM 20 MG PO TABS: 20.0000 mg | ORAL_TABLET | Freq: Every day | ORAL | 1 refills | Status: DC

## 2017-05-05 MED ORDER — MECLIZINE HCL 25 MG PO TABS: 25.0000 mg | ORAL_TABLET | Freq: Three times a day (TID) | ORAL | 0 refills | Status: DC | PRN

## 2017-05-05 MED ORDER — SERTRALINE HCL 50 MG PO TABS: 50.0000 mg | ORAL_TABLET | Freq: Every day | ORAL | 3 refills | Status: DC

## 2017-05-05 MED ORDER — MELOXICAM 7.5 MG PO TABS: 7.5000 mg | ORAL_TABLET | Freq: Every day | ORAL | 0 refills | Status: DC

## 2017-05-05 MED ORDER — VALACYCLOVIR HCL 500 MG PO TABS: ORAL_TABLET | ORAL | 11 refills | Status: DC

## 2017-05-05 MED ORDER — LISINOPRIL-HYDROCHLOROTHIAZIDE 20-25 MG PO TABS: ORAL_TABLET | ORAL | 11 refills | Status: DC

## 2017-05-05 NOTE — Progress Notes (Signed)
Name: Cheyenne Bean   MRN: [id number]    DOB: 17-Feb-1956   Date:05/05/2017       Progress Note  Subjective  Chief Complaint  Chief Complaint  Patient presents with  . Depression    working under 2 new supervisors at work- "one is OCD and the other is the opposite"  . Hypertension    needs med refilled    Patieint use valtrex for viral suppression.   Depression         This is a new problem.  The current episode started more than 1 month ago.   The onset quality is gradual.   The problem occurs daily.  The problem has been gradually worsening since onset.  Associated symptoms include decreased concentration, fatigue, insomnia, irritable, decreased interest, appetite change and sad.  Associated symptoms include no helplessness, no hopelessness, no restlessness, no body aches, no myalgias, no headaches, no indigestion and no suicidal ideas.     The symptoms are aggravated by work stress.  Past treatments include nothing.  Compliance with treatment is good.  Previous treatment provided mild relief.  Risk factors: employment change.    Pertinent negatives include no hypothyroidism and no anxiety. Hypertension  This is a chronic problem. The current episode started more than 1 month ago. The problem has been waxing and waning since onset. The problem is controlled. Pertinent negatives include no anxiety, blurred vision, chest pain, headaches, malaise/fatigue, neck pain, orthopnea, palpitations, peripheral edema, PND, shortness of breath or sweats. There are no associated agents to hypertension. Risk factors for coronary artery disease include stress. Past treatments include ACE inhibitors and diuretics. The current treatment provides moderate improvement. There are no compliance problems.  There is no history of angina, kidney disease, CAD/MI, CVA, heart failure, left ventricular hypertrophy, PVD or retinopathy. There is no history of chronic renal disease, a hypertension causing med or  renovascular disease.  Hyperlipidemia  This is a chronic problem. The current episode started more than 1 year ago. The problem is controlled. Recent lipid tests were reviewed and are normal. She has no history of chronic renal disease, diabetes, hypothyroidism, liver disease or obesity. There are no known factors aggravating her hyperlipidemia. Pertinent negatives include no chest pain, focal weakness, myalgias or shortness of breath. Current antihyperlipidemic treatment includes statins.  Neck Pain   This is a new problem. The current episode started more than 1 month ago. The problem has been waxing and waning. The pain is associated with nothing. The pain is mild. The symptoms are aggravated by stress. Associated symptoms include numbness. Pertinent negatives include no chest pain, fever, headaches, tingling or weight loss. Associated symptoms comments: Left arm. The treatment provided mild relief.    No problem-specific Assessment & Plan notes found for this encounter.   Past Medical History:  Diagnosis Date  . Genital herpes   . Hyperlipidemia   . Hypertension   . Sarcoidosis     Past Surgical History:  Procedure Laterality Date  . BACK SURGERY    . BUNIONECTOMY Left   . COLONOSCOPY  2012   cleared for 10 yrs  . COLONOSCOPY WITH PROPOFOL N/A 02/27/2017   Procedure: COLONOSCOPY WITH PROPOFOL;  Surgeon: Christena Deem, MD;  Location: [facility] ENDOSCOPY;  Service: Endoscopy;  Laterality: N/A;  . VAGINAL HYSTERECTOMY      Family History  Problem Relation Age of Onset  . Hypertension Mother   . Breast cancer Sister 53    Social History   Social History  .  Marital status: Married    Spouse name: N/A  . Number of children: N/A  . Years of education: N/A   Occupational History  . Not on file.   Social History Main Topics  . Smoking status: Never Smoker  . Smokeless tobacco: Never Used  . Alcohol use No  . Drug use: No  . Sexual activity: Yes   Other Topics Concern  .  Not on file   Social History Narrative  . No narrative on file    No Known Allergies  Outpatient Medications Prior to Visit  Medication Sig Dispense Refill  . Turmeric 500 MG CAPS Take by mouth.    Marland Kitchen. lisinopril-hydrochlorothiazide (PRINZIDE,ZESTORETIC) 20-25 MG tablet TAKE ONE TABLET BY MOUTH ONCE DAILY NEED TO SCHEDULE AN APPOINTMENT 30 tablet 0  . meclizine (ANTIVERT) 25 MG tablet Take 1 tablet (25 mg total) by mouth 3 (three) times daily as needed for dizziness. 30 tablet 0  . pravastatin (PRAVACHOL) 20 MG tablet Take 1 tablet (20 mg total) by mouth daily. 90 tablet 1  . valACYclovir (VALTREX) 500 MG tablet TAKE ONE CAPLET BY MOUTH ONCE DAILY SCHEDULE  AN  APPOINTMENT 30 tablet 0  . meloxicam (MOBIC) 7.5 MG tablet Take 1 tablet (7.5 mg total) by mouth daily. (Patient not taking: Reported on 05/05/2017) 30 tablet 0  . valACYclovir (VALTREX) 500 MG tablet TAKE ONE TABLET BY MOUTH ONCE DAILY SCHED APPOINTMENT 90 tablet 0   No facility-administered medications prior to visit.     Review of Systems  Constitutional: Positive for appetite change and fatigue. Negative for chills, fever, malaise/fatigue and weight loss.  HENT: Negative for ear discharge, ear pain and sore throat.   Eyes: Negative for blurred vision.  Respiratory: Negative for cough, sputum production, shortness of breath and wheezing.   Cardiovascular: Negative for chest pain, palpitations, orthopnea, leg swelling and PND.  Gastrointestinal: Negative for abdominal pain, blood in stool, constipation, diarrhea, heartburn, melena and nausea.  Genitourinary: Negative for dysuria, frequency, hematuria and urgency.  Musculoskeletal: Negative for back pain, joint pain, myalgias and neck pain.  Skin: Negative for rash.  Neurological: Positive for numbness. Negative for dizziness, tingling, sensory change, focal weakness and headaches.  Endo/Heme/Allergies: Negative for environmental allergies and polydipsia. Does not bruise/bleed  easily.  Psychiatric/Behavioral: Positive for decreased concentration and depression. Negative for suicidal ideas. The patient has insomnia. The patient is not nervous/anxious.      Objective  Vitals:   05/05/17 0907  BP: 120/80  Pulse: 80  Weight: 159 lb (72.1 kg)  Height: 5\' 4"  (1.626 m)    Physical Exam  Constitutional: She is well-developed, well-nourished, and in no distress. She is irritable. No distress.  HENT:  Head: Normocephalic and atraumatic.  Right Ear: External ear normal.  Left Ear: External ear normal.  Nose: Nose normal.  Mouth/Throat: Oropharynx is clear and moist.  Eyes: Conjunctivae and EOM are normal. Pupils are equal, round, and reactive to light. Right eye exhibits no discharge. Left eye exhibits no discharge.  Neck: Normal range of motion. Neck supple. No JVD present. No thyromegaly present.  Cardiovascular: Normal rate, regular rhythm, normal heart sounds and intact distal pulses.  Exam reveals no gallop and no friction rub.   No murmur heard. Pulmonary/Chest: Effort normal and breath sounds normal. She has no wheezes. She has no rales.  Abdominal: Soft. Bowel sounds are normal. She exhibits no mass. There is no tenderness. There is no guarding.  Musculoskeletal: Normal range of motion. She exhibits no edema.  Lymphadenopathy:    She has no cervical adenopathy.  Neurological: She is alert. She has normal reflexes.  Skin: Skin is warm and dry. Rash noted. She is not diaphoretic.  Psychiatric: Mood and affect normal.  Nursing note and vitals reviewed.     Assessment & Plan  Problem List Items Addressed This Visit      Cardiovascular and Mediastinum   Essential hypertension   Relevant Medications   pravastatin (PRAVACHOL) 20 MG tablet   lisinopril-hydrochlorothiazide (PRINZIDE,ZESTORETIC) 20-25 MG tablet     Genitourinary   Recurrent genital herpes simplex   Relevant Medications   valACYclovir (VALTREX) 500 MG tablet     Other   Pure  hypercholesterolemia   Relevant Medications   pravastatin (PRAVACHOL) 20 MG tablet   lisinopril-hydrochlorothiazide (PRINZIDE,ZESTORETIC) 20-25 MG tablet    Other Visit Diagnoses    Anxiety and depression    -  Primary   Relevant Medications   sertraline (ZOLOFT) 50 MG tablet   Dizziness       Relevant Medications   meclizine (ANTIVERT) 25 MG tablet   Cervical pain (neck)       Relevant Medications   meloxicam (MOBIC) 7.5 MG tablet      Meds ordered this encounter  Medications  . valACYclovir (VALTREX) 500 MG tablet    Sig: TAKE ONE CAPLET BY MOUTH ONCE DAILY SCHEDULE  AN  APPOINTMENT    Dispense:  30 tablet    Refill:  11  . pravastatin (PRAVACHOL) 20 MG tablet    Sig: Take 1 tablet (20 mg total) by mouth daily.    Dispense:  90 tablet    Refill:  1  . meclizine (ANTIVERT) 25 MG tablet    Sig: Take 1 tablet (25 mg total) by mouth 3 (three) times daily as needed for dizziness.    Dispense:  30 tablet    Refill:  0  . lisinopril-hydrochlorothiazide (PRINZIDE,ZESTORETIC) 20-25 MG tablet    Sig: TAKE ONE TABLET BY MOUTH ONCE DAILY NEED TO SCHEDULE AN APPOINTMENT    Dispense:  30 tablet    Refill:  11  . meloxicam (MOBIC) 7.5 MG tablet    Sig: Take 1 tablet (7.5 mg total) by mouth daily.    Dispense:  30 tablet    Refill:  0  . sertraline (ZOLOFT) 50 MG tablet    Sig: Take 1 tablet (50 mg total) by mouth daily.    Dispense:  30 tablet    Refill:  3    One half tablet q day for 1 week then 1 a day      Dr. Hayden [facility]  05/05/17

## 2017-07-02 ENCOUNTER — Other Ambulatory Visit: Payer: Self-pay

## 2017-07-03 ENCOUNTER — Encounter: Payer: BLUE CROSS/BLUE SHIELD | Admitting: Family Medicine

## 2017-07-03 NOTE — Progress Notes (Signed)
Pt was not seen by doctor- just wanted to know lab results from hospital. Was told to stop taking OTC pain med due to liver function being elevated and we will recheck in 4 weeks

## 2017-07-28 ENCOUNTER — Other Ambulatory Visit: Payer: Self-pay

## 2017-09-24 ENCOUNTER — Encounter: Payer: Self-pay | Admitting: Obstetrics and Gynecology

## 2017-09-25 ENCOUNTER — Ambulatory Visit (INDEPENDENT_AMBULATORY_CARE_PROVIDER_SITE_OTHER): Payer: BLUE CROSS/BLUE SHIELD | Admitting: Obstetrics and Gynecology

## 2017-09-25 ENCOUNTER — Encounter: Payer: Self-pay | Admitting: Obstetrics and Gynecology

## 2017-09-25 DIAGNOSIS — Z1239 Encounter for other screening for malignant neoplasm of breast: Secondary | ICD-10-CM

## 2017-09-25 DIAGNOSIS — Z1231 Encounter for screening mammogram for malignant neoplasm of breast: Secondary | ICD-10-CM | POA: Diagnosis not present

## 2017-09-25 DIAGNOSIS — Z01419 Encounter for gynecological examination (general) (routine) without abnormal findings: Secondary | ICD-10-CM | POA: Diagnosis not present

## 2017-09-25 NOTE — Progress Notes (Signed)
Gynecology Annual Exam  PCP: Juline Patch, MD  Chief Complaint:  Chief Complaint  Patient presents with  . Gynecologic Exam    History of Present Illness:Patient is a 61 y.o. No obstetric history on file. presents for annual exam. The patient has no complaints today.   LMP: No LMP recorded. Patient has had a hysterectomy.  The patient is sexually active. She denies dyspareunia.  The patient does perform self breast exams.  There is no notable family history of breast or ovarian cancer in her family.  The patient wears seatbelts: yes.   The patient has regular exercise: not asked.    The patient denies current symptoms of depression.     Review of Systems: Review of Systems  Constitutional: Negative for chills and fever.  HENT: Negative for congestion.   Respiratory: Negative for cough and shortness of breath.   Cardiovascular: Negative for chest pain and palpitations.  Gastrointestinal: Negative for abdominal pain, constipation, diarrhea, heartburn, nausea and vomiting.  Genitourinary: Negative for dysuria, frequency and urgency.  Skin: Negative for itching and rash.  Neurological: Negative for dizziness and headaches.  Endo/Heme/Allergies: Negative for polydipsia.  Psychiatric/Behavioral: Negative for depression.    Past Medical History:  Past Medical History:  Diagnosis Date  . BRCA negative 01/2017  . Genital herpes   . Hyperlipidemia   . Hypertension   . Sarcoidosis     Past Surgical History:  Past Surgical History:  Procedure Laterality Date  . BACK SURGERY    . BUNIONECTOMY Left   . COLONOSCOPY  2012   cleared for 10 yrs  . COLONOSCOPY WITH PROPOFOL N/A 02/27/2017   Procedure: COLONOSCOPY WITH PROPOFOL;  Surgeon: Lollie Sails, MD;  Location: [facility] ENDOSCOPY;  Service: Endoscopy;  Laterality: N/A;  . VAGINAL HYSTERECTOMY      Gynecologic History:  No LMP recorded. Patient has had a hysterectomy. Last Pap: Results were: s/p Hysterectomy Last  mammogram: 04/22/17 Results were: BI-RAD I Obstetric History: No obstetric history on file.  Family History:  Family History  Problem Relation Age of Onset  . Hypertension Mother   . Breast cancer Sister 41    Social History:  Social History   Social History  . Marital status: Married    Spouse name: N/A  . Number of children: N/A  . Years of education: N/A   Occupational History  . Not on file.   Social History Main Topics  . Smoking status: Never Smoker  . Smokeless tobacco: Never Used  . Alcohol use No  . Drug use: No  . Sexual activity: Yes   Other Topics Concern  . Not on file   Social History Narrative  . No narrative on file    Allergies:  No Known Allergies  Medications: Prior to Admission medications   Medication Sig Start Date End Date Taking? Authorizing Provider  lisinopril-hydrochlorothiazide (PRINZIDE,ZESTORETIC) 20-25 MG tablet TAKE ONE TABLET BY MOUTH ONCE DAILY NEED TO SCHEDULE AN APPOINTMENT 05/05/17   Juline Patch, MD  meclizine (ANTIVERT) 25 MG tablet Take 1 tablet (25 mg total) by mouth 3 (three) times daily as needed for dizziness. 05/05/17   Juline Patch, MD  meloxicam (MOBIC) 7.5 MG tablet Take 1 tablet (7.5 mg total) by mouth daily. 05/05/17   Juline Patch, MD  pravastatin (PRAVACHOL) 20 MG tablet Take 1 tablet (20 mg total) by mouth daily. 05/05/17   Juline Patch, MD  sertraline (ZOLOFT) 50 MG tablet Take 1 tablet (50  mg total) by mouth daily. 05/05/17   Juline Patch, MD  Turmeric 500 MG CAPS Take by mouth.    [provider]  valACYclovir (VALTREX) 500 MG tablet TAKE ONE CAPLET BY MOUTH ONCE DAILY SCHEDULE  AN  APPOINTMENT 05/05/17   Juline Patch, MD    Physical Exam Vitals: Blood pressure 128/80, pulse 78, height '5\' 4"'  (1.626 m), weight 160 lb (72.6 kg).  General: NAD HEENT: normocephalic, anicteric Thyroid: no enlargement, no palpable nodules Pulmonary: No increased work of breathing, CTAB Cardiovascular: RRR,  distal pulses 2+ Breast: Breast symmetrical, no tenderness, no palpable nodules or masses, no skin or nipple retraction present, no nipple discharge.  No axillary or supraclavicular lymphadenopathy. Abdomen: NABS, soft, non-tender, non-distended.  Umbilicus without lesions.  No hepatomegaly, splenomegaly or masses palpable. No evidence of hernia  Genitourinary:  External: Normal external female genitalia.  Normal urethral meatus, normal  Bartholin's and Skene's glands.    Vagina: Normal vaginal mucosa, no evidence of prolapse.    Cervix: surgically absent  Uterus: surgically absent  Adnexa: ovaries non-enlarged, no adnexal masses  Rectal: deferred  Lymphatic: no evidence of inguinal lymphadenopathy Extremities: no edema, erythema, or tenderness Neurologic: Grossly intact Psychiatric: mood appropriate, affect full  Female chaperone present for pelvic and breast  portions of the physical exam     Assessment: 61 y.o. No obstetric history on file. routine annual exam  Plan: Problem List Items Addressed This Visit    None      1) Mammogram - recommend yearly screening mammogram.  Mammogram Is up to date - [facility] MyRisk negative 01/12/17 with IBIS 10.7%  2) STI screening was offered and declined  3) ASCCP guidelines and rational discussed.  Patient opts for discontinue screening interval  4) Osteoporosis  - per USPTF routine screening DEXA at age 39  5) Routine healthcare maintenance including cholesterol, diabetes screening discussed managed by PCP  6) Colonoscopy - up to date - 02/27/17 [place]  7) Follow up 1 year for routine annual

## 2018-01-01 ENCOUNTER — Other Ambulatory Visit: Payer: Self-pay | Admitting: Family Medicine

## 2018-01-01 DIAGNOSIS — E7800 Pure hypercholesterolemia, unspecified: Secondary | ICD-10-CM

## 2018-02-01 IMAGING — MG MM DIGITAL SCREENING BILAT W/ CAD
1 series · 4 of 4 positions shown · non-contrast
Comparison: Previous exam(s).

CLINICAL DATA: Screening.

EXAM:
DIGITAL SCREENING BILATERAL MAMMOGRAM WITH CAD

[R CC · right · 4 of 4 slices shown]
[im 1/4]
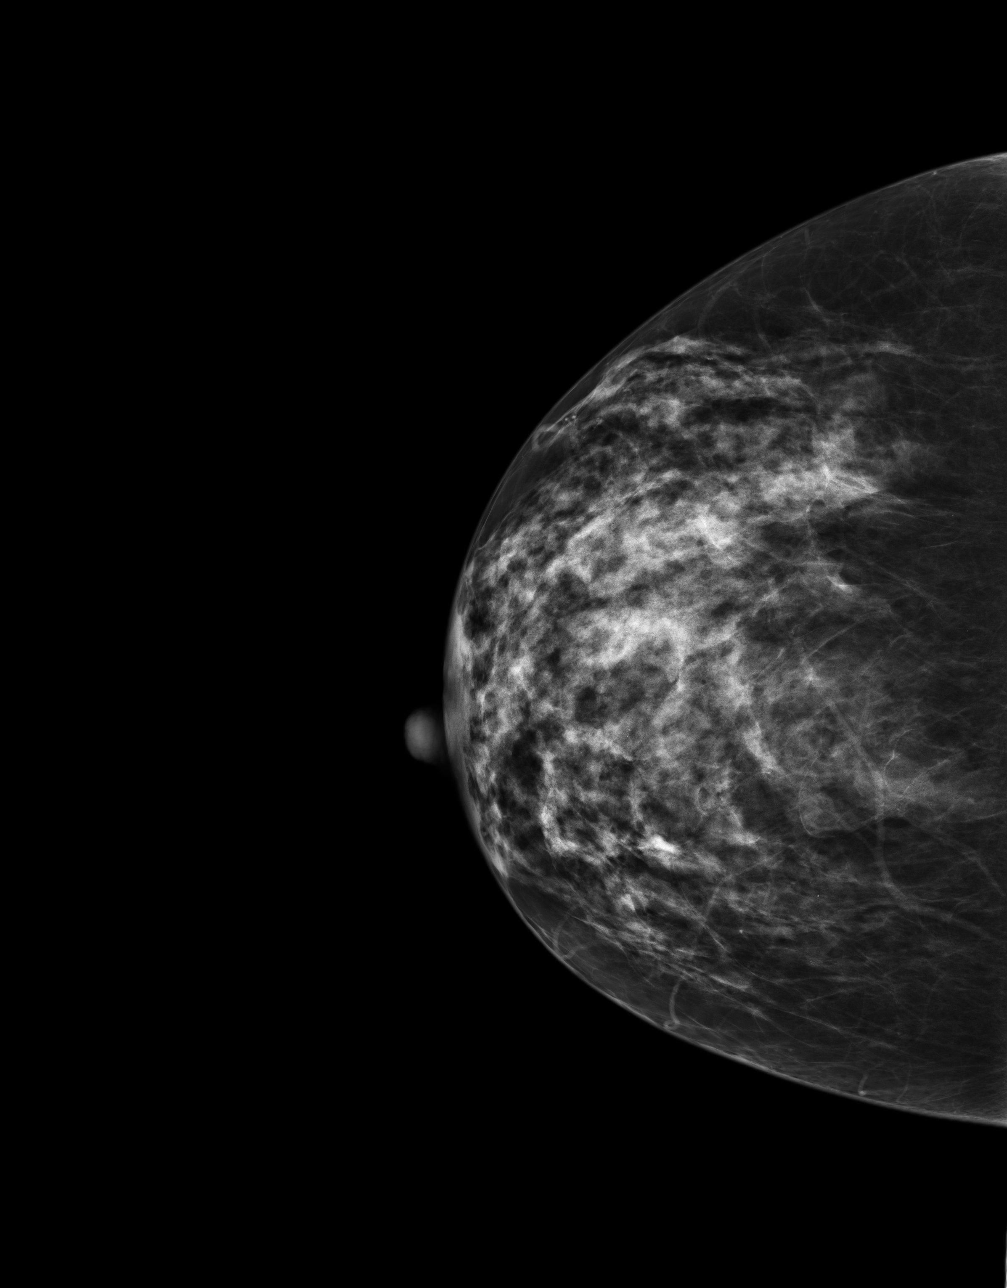
[im 2/4]
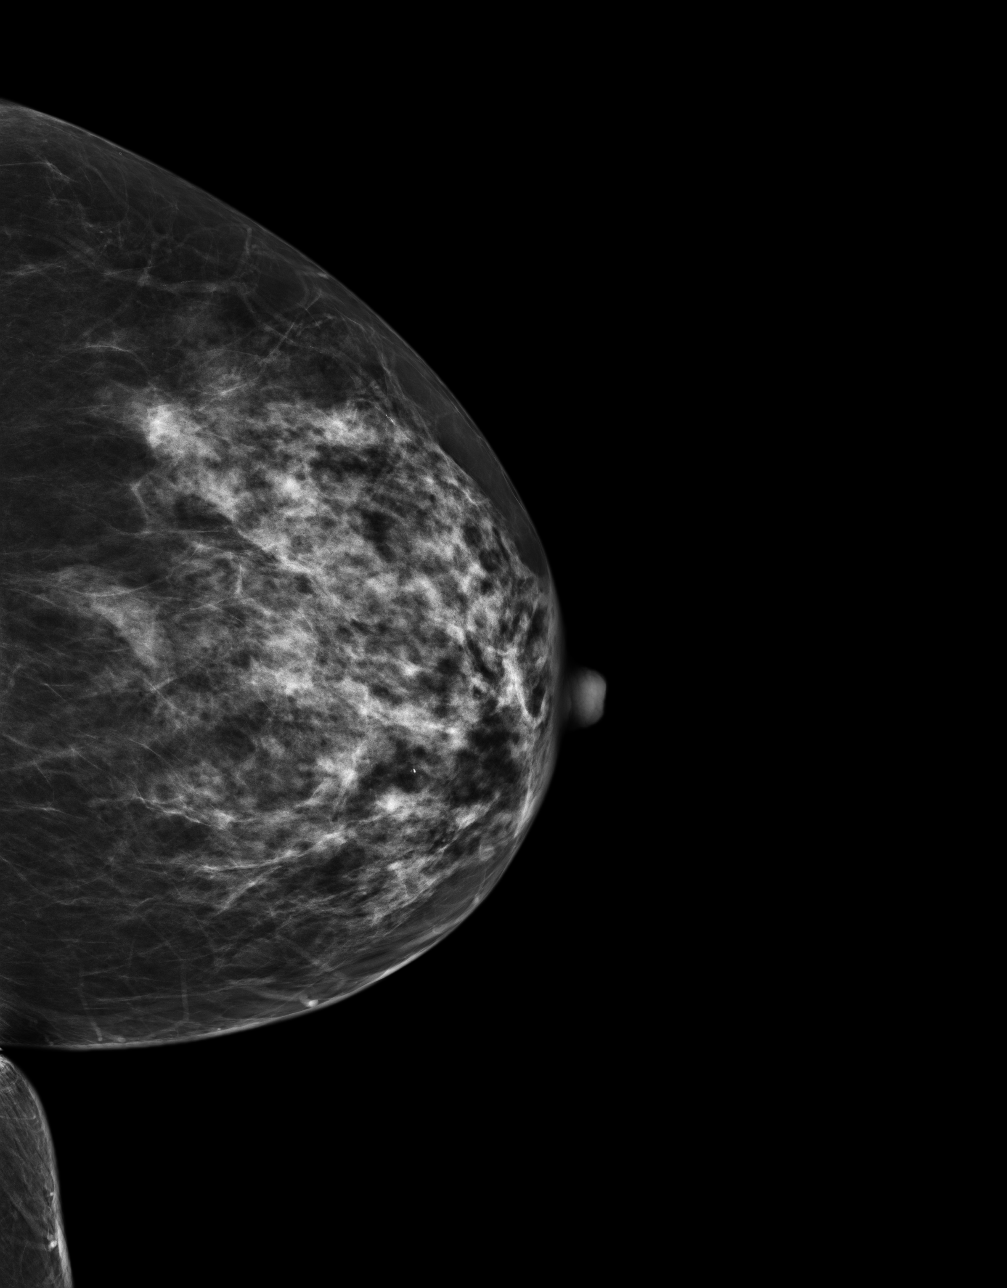
[im 3/4]
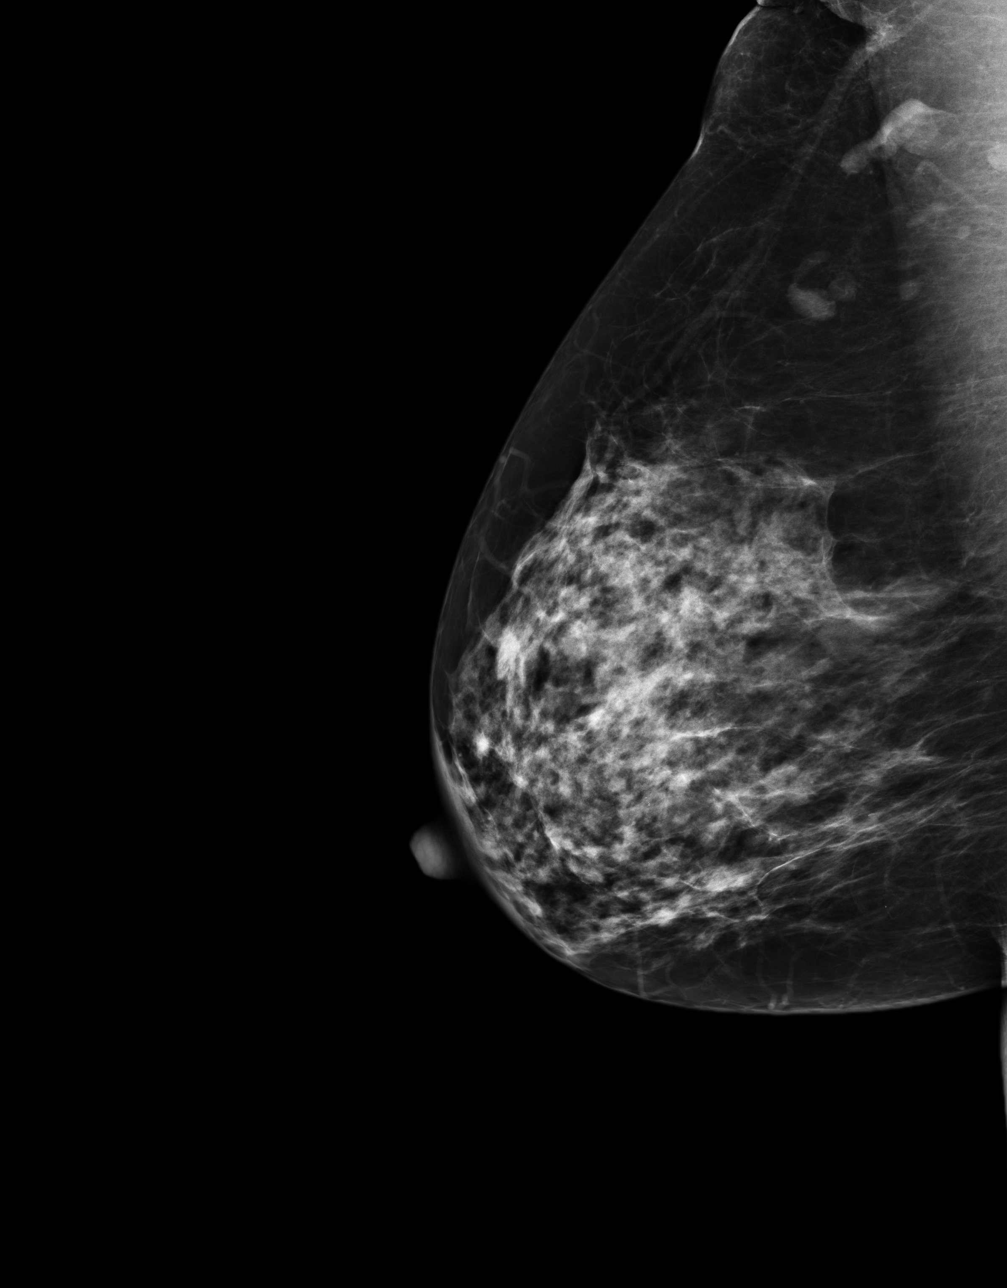
[im 4/4]
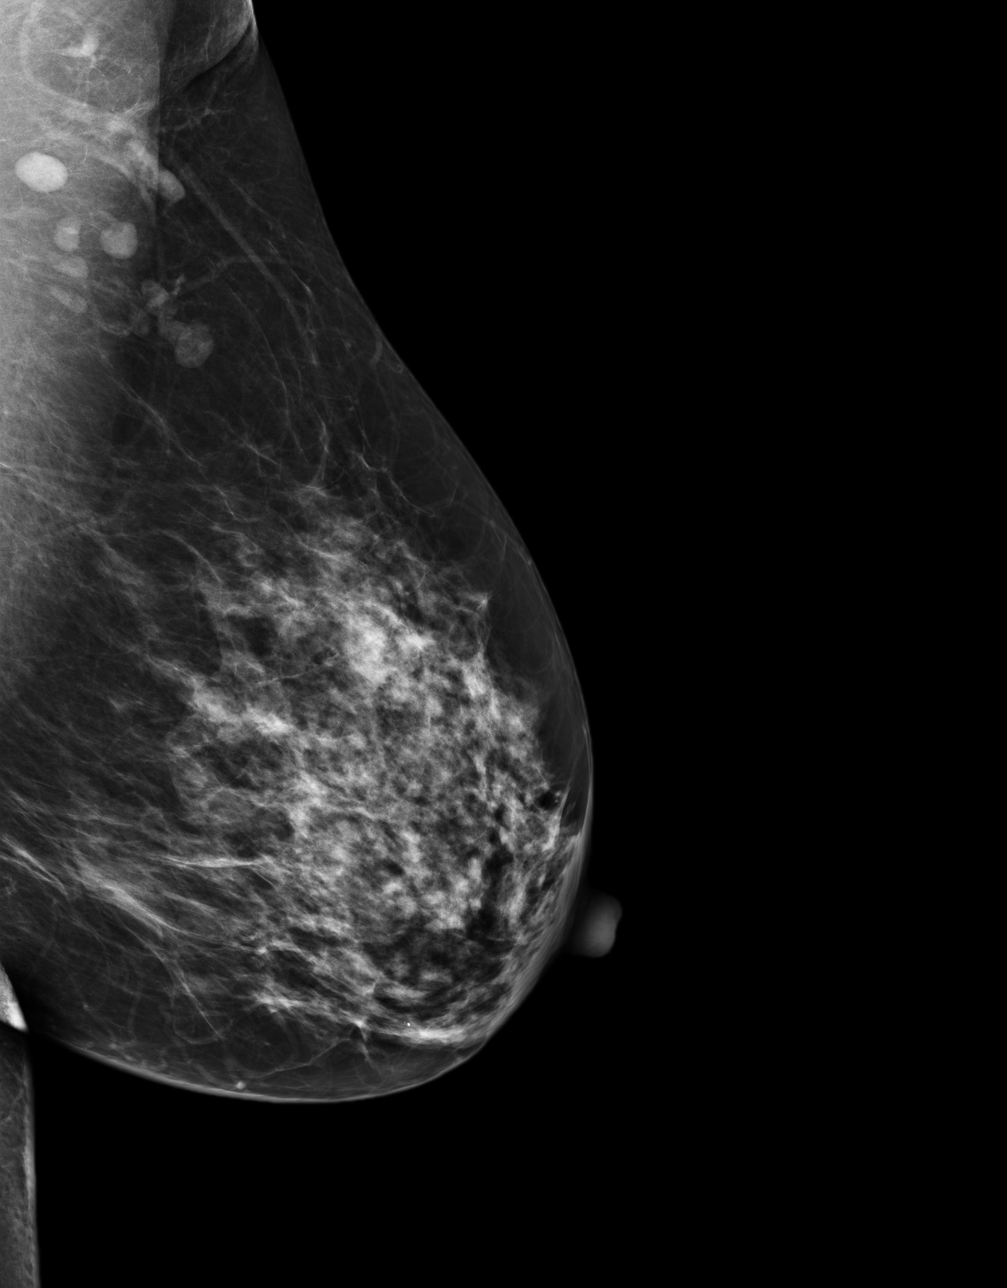

[4 of 4 positions shown; findings below may reference images not displayed]

ACR Breast Density Category c: The breast tissue is heterogeneously
dense, which may obscure small masses.
FINDINGS: There are no findings suspicious for malignancy. Images were
processed with CAD.
IMPRESSION: No mammographic evidence of malignancy. A result letter of this
screening mammogram will be mailed directly to the patient.

RECOMMENDATION:
Screening mammogram in one year. (Code:YJ-2-FEZ)

BI-RADS CATEGORY  1: Negative.

## 2018-03-12 ENCOUNTER — Encounter: Payer: Self-pay | Admitting: Obstetrics and Gynecology

## 2018-03-12 ENCOUNTER — Ambulatory Visit (INDEPENDENT_AMBULATORY_CARE_PROVIDER_SITE_OTHER): Payer: BLUE CROSS/BLUE SHIELD | Admitting: Obstetrics and Gynecology

## 2018-03-12 VITALS — BP 122/74 | HR 67 | Ht 64.0 in | Wt 162.0 lb

## 2018-03-12 DIAGNOSIS — B373 Candidiasis of vulva and vagina: Secondary | ICD-10-CM | POA: Diagnosis not present

## 2018-03-12 DIAGNOSIS — N644 Mastodynia: Secondary | ICD-10-CM | POA: Diagnosis not present

## 2018-03-12 DIAGNOSIS — Z1371 Encounter for nonprocreative screening for genetic disease carrier status: Secondary | ICD-10-CM

## 2018-03-12 DIAGNOSIS — N6320 Unspecified lump in the left breast, unspecified quadrant: Secondary | ICD-10-CM | POA: Diagnosis not present

## 2018-03-12 DIAGNOSIS — B3731 Acute candidiasis of vulva and vagina: Secondary | ICD-10-CM

## 2018-03-12 MED ORDER — FLUCONAZOLE 150 MG PO TABS: 150.0000 mg | ORAL_TABLET | Freq: Once | ORAL | 0 refills | Status: AC

## 2018-03-12 NOTE — Progress Notes (Signed)
Obstetrics & Gynecology Office Visit   Chief Complaint:  Chief Complaint  Patient presents with  . Breast check    Left breast pain  . Vaginitis    vaginal odor    History of Present Illness: 62 year old presenting for evaluation of left breast tenderness and lump.  Symptoms onset in the past week.  She feels the tenderness has been gradually improving.  She is unable to recall trauma or inciting event.  No current HRT use.  She has a history of follow up mammogram views of the left breast but her most recent mammogram 04/22/2017 was read as BI-RADs.  Previous BRCA testing was negative  Ms. Tabor Bartram Kolasinski is a 62 y.o. G1P0010 who LMP was No LMP recorded. Patient has had a hysterectomy., presents today for a problem visit.   Patient complains of an abnormal vaginal discharge for 2 days. Discharge described as: white. Vaginal symptoms include local irritation.   Other associated symptoms: none.Menstrual pattern:She denies recent antibiotic exposure, denies changes in soaps, detergents coinciding with the onset of her symptoms.  She has not previously self treated or been under treatment by another provider for these symptoms.   Review of Systems: Review of Systems  Constitutional: Negative for chills, fever and weight loss.  Cardiovascular: Positive for chest pain.  Genitourinary: Negative for dysuria, frequency and urgency.     Past Medical History:  Past Medical History:  Diagnosis Date  . BRCA negative 01/2017  . Genital herpes   . Hyperlipidemia   . Hypertension   . IBS (irritable bowel syndrome) 02/20/2017  . Sarcoidosis     Past Surgical History:  Past Surgical History:  Procedure Laterality Date  . BACK SURGERY    . BUNIONECTOMY Left   . COLONOSCOPY  2012   cleared for 10 yrs  . COLONOSCOPY WITH PROPOFOL N/A 02/27/2017   Procedure: COLONOSCOPY WITH PROPOFOL;  Surgeon: Lollie Sails, MD;  Location: [facility] ENDOSCOPY;  Service: Endoscopy;  Laterality: N/A;  .  VAGINAL HYSTERECTOMY      Gynecologic History: No LMP recorded. Patient has had a hysterectomy.  Obstetric History: G1P0010  Family History:  Family History  Problem Relation Age of Onset  . Hypertension Mother   . Breast cancer Sister 37  . Cervical cancer Sister 59  . Ovarian cancer Maternal Aunt 60  . Colon cancer Maternal Uncle 27    Social History:  Social History   Socioeconomic History  . Marital status: Married    Spouse name: Not on file  . Number of children: Not on file  . Years of education: Not on file  . Highest education level: Not on file  Occupational History  . Not on file  Social Needs  . Financial resource strain: Not on file  . Food insecurity:    Worry: Not on file    Inability: Not on file  . Transportation needs:    Medical: Not on file    Non-medical: Not on file  Tobacco Use  . Smoking status: Never Smoker  . Smokeless tobacco: Never Used  Substance and Sexual Activity  . Alcohol use: No    Alcohol/week: 0.0 oz  . Drug use: No  . Sexual activity: Yes  Lifestyle  . Physical activity:    Days per week: Not on file    Minutes per session: Not on file  . Stress: Not on file  Relationships  . Social connections:    Talks on phone: Not on  file    Gets together: Not on file    Attends religious service: Not on file    Active member of club or organization: Not on file    Attends meetings of clubs or organizations: Not on file    Relationship status: Not on file  . Intimate partner violence:    Fear of current or ex partner: Not on file    Emotionally abused: Not on file    Physically abused: Not on file    Forced sexual activity: Not on file  Other Topics Concern  . Not on file  Social History Narrative  . Not on file    Allergies:  No Known Allergies  Medications: Prior to Admission medications   Medication Sig Start Date End Date Taking? Authorizing Provider  lisinopril-hydrochlorothiazide (PRINZIDE,ZESTORETIC) 20-25 MG  tablet TAKE ONE TABLET BY MOUTH ONCE DAILY NEED TO SCHEDULE AN APPOINTMENT 05/05/17  Yes Juline Patch, MD  pravastatin (PRAVACHOL) 20 MG tablet TAKE 1 TABLET BY MOUTH ONCE DAILY 01/04/18  Yes Juline Patch, MD  valACYclovir (VALTREX) 500 MG tablet TAKE ONE CAPLET BY MOUTH ONCE DAILY SCHEDULE  AN  APPOINTMENT 05/05/17  Yes Juline Patch, MD  meclizine (ANTIVERT) 25 MG tablet Take 1 tablet (25 mg total) by mouth 3 (three) times daily as needed for dizziness. Patient not taking: Reported on 03/12/2018 05/05/17   Juline Patch, MD  sertraline (ZOLOFT) 50 MG tablet Take 1 tablet (50 mg total) by mouth daily. Patient not taking: Reported on 03/12/2018 05/05/17   Juline Patch, MD  Turmeric 500 MG CAPS Take by mouth.    [provider]    Physical Exam Vitals:  Vitals:   03/12/18 0947  BP: 122/74  Pulse: 67   No LMP recorded. Patient has had a hysterectomy.  General: NAD HEENT: normocephalic, anicteric Thyroid: no enlargement, no palpable nodules Breast symmetrical, no tenderness, there is an area of increased breast density noted in the the left breast upper outer quadrant approximately 5cm from the nipple line, no skin or nipple retraction present, no nipple discharge.  No axillary or supraclavicular lymphadenopathy. Pulmonary: No increased work of breathing Genitourinary:  External: Normal external female genitalia.  Normal urethral meatus, normal  Bartholin's and Skene's glands.    Vagina: Normal vaginal mucosa, no evidence of prolapse.   Extremities: no edema, erythema, or tenderness Neurologic: Grossly intact Psychiatric: mood appropriate, affect full  Female chaperone present for pelvic  portions of the physical exam   Wet Prep: PH: <4.5 Clue Cells: Negative Fungal elements: Positive Trichomonas: Negative   Assessment: 62 y.o. G1P0010 with left breast tenderness and focal area of increased breast density,   Plan: Problem List Items Addressed This Visit    None     Visit Diagnoses    Left breast mass    -  Primary   Relevant Orders   MM DIAG BREAST TOMO UNI LEFT   BRCA negative       Relevant Orders   MM DIAG BREAST TOMO UNI LEFT   Mastodynia of left breast       Relevant Orders   MM DIAG BREAST TOMO UNI LEFT   Candida vaginitis         1) Left breast tenderness - does have slightly denser breast tissue palpated in the left breast upper outer left quadrant approximately 5cm from the nipple.  Will obtain diagnostic mammogram of left breast.   2) Risk factors for bacterial vaginosis and candida infections discussed.  We discussed normal vaginal flora/microbiome.  Any factors that may alter the microbiome increase the risk of these opportunistic infections.  These include changes in pH, antibiotic exposures, diabetes, wet bathing suits etc.  We discussed that treatment is aimed at eradicating abnormal bacterial overgrowth and or yeast.  There may be some role for vaginal probiotics in restoring normal vaginal flora.   - rx diflucan  3) A total of 15 minutes were spent in face-to-face contact with the patient during this encounter with over half of that time devoted to counseling and coordination of care.   Malachy Mood, MD, [facility], [facility] 03/14/2018, 9:07 PM

## 2018-03-16 ENCOUNTER — Ambulatory Visit (INDEPENDENT_AMBULATORY_CARE_PROVIDER_SITE_OTHER): Payer: BLUE CROSS/BLUE SHIELD | Admitting: Family Medicine

## 2018-03-16 ENCOUNTER — Encounter: Payer: Self-pay | Admitting: Family Medicine

## 2018-03-16 VITALS — BP 120/80 | HR 80 | Ht 64.0 in | Wt 156.0 lb

## 2018-03-16 DIAGNOSIS — Z23 Encounter for immunization: Secondary | ICD-10-CM | POA: Diagnosis not present

## 2018-03-16 DIAGNOSIS — E7800 Pure hypercholesterolemia, unspecified: Secondary | ICD-10-CM | POA: Diagnosis not present

## 2018-03-16 DIAGNOSIS — I10 Essential (primary) hypertension: Secondary | ICD-10-CM

## 2018-03-16 MED ORDER — LISINOPRIL-HYDROCHLOROTHIAZIDE 20-25 MG PO TABS: ORAL_TABLET | ORAL | 11 refills | Status: DC

## 2018-03-16 MED ORDER — PRAVASTATIN SODIUM 20 MG PO TABS: 20.0000 mg | ORAL_TABLET | Freq: Every day | ORAL | 11 refills | Status: DC

## 2018-03-16 NOTE — Progress Notes (Signed)
Name: Cheyenne Bean   MRN: [id number]    DOB: [birth year]   Date:03/16/2018       Progress Note  Subjective  Chief Complaint  Chief Complaint  Patient presents with  . Hyperlipidemia  . Hypertension  . Depression  . genital herpes    takes valacyclovir daily    Hyperlipidemia  This is a chronic problem. The current episode started more than 1 year ago. The problem is controlled. Recent lipid tests were reviewed and are normal. She has no history of chronic renal disease, diabetes, hypothyroidism, liver disease, obesity or nephrotic syndrome. There are no known factors aggravating her hyperlipidemia. Pertinent negatives include no chest pain, focal sensory loss, focal weakness, leg pain, myalgias or shortness of breath. Current antihyperlipidemic treatment includes statins. The current treatment provides mild improvement of lipids. There are no compliance problems.  Risk factors for coronary artery disease include stress, dyslipidemia and hypertension.  Hypertension  This is a chronic problem. The current episode started more than 1 year ago. The problem has been waxing and waning since onset. The problem is controlled. Pertinent negatives include no anxiety, blurred vision, chest pain, headaches, malaise/fatigue, neck pain, orthopnea, palpitations, peripheral edema, PND, shortness of breath or sweats. There are no associated agents to hypertension. There are no known risk factors for coronary artery disease. Past treatments include ACE inhibitors and diuretics. The current treatment provides mild improvement. There are no compliance problems.  There is no history of angina, kidney disease, CAD/MI, CVA, heart failure, left ventricular hypertrophy, PVD or retinopathy. There is no history of chronic renal disease, a hypertension causing med or renovascular disease.  Depression         This is a new problem.  The current episode started more than 1 month ago.   The onset quality is gradual.    The problem occurs intermittently.  The problem has been gradually improving since onset.  Associated symptoms include does not have insomnia, no myalgias, no headaches and no suicidal ideas.     Exacerbated by: work Social worker zone"  Past treatments include nothing (unable to take sertraline).  Compliance with treatment is good.  Previous treatment provided moderate relief.   Pertinent negatives include no hypothyroidism and no anxiety.   No problem-specific Assessment & Plan notes found for this encounter.   Past Medical History:  Diagnosis Date  . BRCA negative 01/2017  . Genital herpes   . Hyperlipidemia   . Hypertension   . IBS (irritable bowel syndrome) 02/20/2017  . Sarcoidosis     Past Surgical History:  Procedure Laterality Date  . BACK SURGERY    . BUNIONECTOMY Left   . COLONOSCOPY  2012   cleared for 10 yrs  . COLONOSCOPY WITH PROPOFOL N/A 02/27/2017   Procedure: COLONOSCOPY WITH PROPOFOL;  Surgeon: Lollie Sails, MD;  Location[facility] ENDOSCOPY;  Service: Endoscopy;  Laterality: N/A;  . VAGINAL HYSTERECTOMY      Family History  Problem Relation Age of Onset  . Hypertension Mother   . Breast cancer Sister 97  . Cervical cancer Sister 58  . Ovarian cancer Maternal Aunt 60  . Colon cancer Maternal Uncle 35    Social History   Socioeconomic History  . Marital status: Married    Spouse name: Not on file  . Number of children: Not on file  . Years of education: Not on file  . Highest education level: Not on file  Occupational History  . Not on file  Social Needs  .  Financial resource strain: Not on file  . Food insecurity:    Worry: Not on file    Inability: Not on file  . Transportation needs:    Medical: Not on file    Non-medical: Not on file  Tobacco Use  . Smoking status: Never Smoker  . Smokeless tobacco: Never Used  Substance and Sexual Activity  . Alcohol use: No    Alcohol/week: 0.0 oz  . Drug use: No  . Sexual activity: Yes  Lifestyle  .  Physical activity:    Days per week: Not on file    Minutes per session: Not on file  . Stress: Not on file  Relationships  . Social connections:    Talks on phone: Not on file    Gets together: Not on file    Attends religious service: Not on file    Active member of club or organization: Not on file    Attends meetings of clubs or organizations: Not on file    Relationship status: Not on file  . Intimate partner violence:    Fear of current or ex partner: Not on file    Emotionally abused: Not on file    Physically abused: Not on file    Forced sexual activity: Not on file  Other Topics Concern  . Not on file  Social History Narrative  . Not on file    No Known Allergies  Outpatient Medications Prior to Visit  Medication Sig Dispense Refill  . meclizine (ANTIVERT) 25 MG tablet Take 1 tablet (25 mg total) by mouth 3 (three) times daily as needed for dizziness. 30 tablet 0  . Turmeric 500 MG CAPS Take by mouth.    . valACYclovir (VALTREX) 500 MG tablet TAKE ONE CAPLET BY MOUTH ONCE DAILY SCHEDULE  AN  APPOINTMENT 30 tablet 11  . lisinopril-hydrochlorothiazide (PRINZIDE,ZESTORETIC) 20-25 MG tablet TAKE ONE TABLET BY MOUTH ONCE DAILY NEED TO SCHEDULE AN APPOINTMENT 30 tablet 11  . pravastatin (PRAVACHOL) 20 MG tablet TAKE 1 TABLET BY MOUTH ONCE DAILY 30 tablet 0  . sertraline (ZOLOFT) 50 MG tablet Take 1 tablet (50 mg total) by mouth daily. 30 tablet 3   No facility-administered medications prior to visit.     Review of Systems  Constitutional: Negative for chills, fever, malaise/fatigue and weight loss.  HENT: Negative for ear discharge, ear pain and sore throat.        Left facial "puffiness"  Eyes: Negative for blurred vision.  Respiratory: Negative for cough, sputum production, shortness of breath and wheezing.        ?provimal clavicles raised  Cardiovascular: Negative for chest pain, palpitations, orthopnea, leg swelling and PND.  Gastrointestinal: Negative for  abdominal pain, blood in stool, constipation, diarrhea, heartburn, melena and nausea.  Genitourinary: Negative for dysuria, frequency, hematuria and urgency.  Musculoskeletal: Negative for back pain, joint pain, myalgias and neck pain.  Skin: Negative for rash.  Neurological: Negative for dizziness, tingling, sensory change, focal weakness and headaches.  Endo/Heme/Allergies: Negative for environmental allergies and polydipsia. Does not bruise/bleed easily.  Psychiatric/Behavioral: Positive for depression. Negative for suicidal ideas. The patient is not nervous/anxious and does not have insomnia.      Objective  Vitals:   03/16/18 1120  BP: 120/80  Pulse: 80  Weight: 156 lb (70.8 kg)  Height: '5\' 4"'  (1.626 m)    Physical Exam  Constitutional: She is well-developed, well-nourished, and in no distress. No distress.  HENT:  Head: Normocephalic and atraumatic.  Right Ear:  External ear normal.  Left Ear: External ear normal.  Nose: Nose normal.  Mouth/Throat: Oropharynx is clear and moist.  Eyes: Pupils are equal, round, and reactive to light. Conjunctivae and EOM are normal. Right eye exhibits no discharge. Left eye exhibits no discharge.  Neck: Normal range of motion. Neck supple. No JVD present. No thyromegaly present.  Cardiovascular: Normal rate, regular rhythm, normal heart sounds and intact distal pulses. Exam reveals no gallop and no friction rub.  No murmur heard. Pulmonary/Chest: Effort normal and breath sounds normal. She has no wheezes. She has no rales.  Abdominal: Soft. Bowel sounds are normal. She exhibits no mass. There is no tenderness. There is no guarding.  Musculoskeletal: Normal range of motion. She exhibits no edema.  Lymphadenopathy:    She has no cervical adenopathy.  Neurological: She is alert. She has normal motor skills, normal sensation, normal strength and normal reflexes.  Skin: Skin is warm and dry. She is not diaphoretic.  Psychiatric: Mood and affect  normal.  Nursing note and vitals reviewed.     Assessment & Plan  Problem List Items Addressed This Visit      Cardiovascular and Mediastinum   Essential hypertension   Relevant Medications   lisinopril-hydrochlorothiazide (PRINZIDE,ZESTORETIC) 20-25 MG tablet   pravastatin (PRAVACHOL) 20 MG tablet   Other Relevant Orders   Renal Function Panel     Other   Pure hypercholesterolemia   Relevant Medications   lisinopril-hydrochlorothiazide (PRINZIDE,ZESTORETIC) 20-25 MG tablet   pravastatin (PRAVACHOL) 20 MG tablet   Other Relevant Orders   Lipid panel    Other Visit Diagnoses    Flu vaccine need    -  Primary   Relevant Orders   Flu Vaccine QUAD 6+ mos PF IM (Fluarix Quad PF) (Completed)      Meds ordered this encounter  Medications  . lisinopril-hydrochlorothiazide (PRINZIDE,ZESTORETIC) 20-25 MG tablet    Sig: TAKE ONE TABLET BY MOUTH ONCE DAILY NEED TO SCHEDULE AN APPOINTMENT    Dispense:  30 tablet    Refill:  11  . pravastatin (PRAVACHOL) 20 MG tablet    Sig: Take 1 tablet (20 mg total) by mouth daily.    Dispense:  30 tablet    Refill:  11    sched appt      Dr. Otilio Miu [facility]  03/16/18

## 2018-03-27 LAB — RENAL FUNCTION PANEL
Albumin: 4.4 g/dL (ref 3.5–5.5)
BUN/Creatinine Ratio: 33 — ABNORMAL HIGH (ref 9–23)
BUN: 28 mg/dL — ABNORMAL HIGH (ref 6–24)
CO2: 23 mmol/L (ref 20–29)
Calcium: 9.7 mg/dL (ref 8.7–10.2)
Chloride: 105 mmol/L (ref 96–106)
Creatinine, Ser: 0.86 mg/dL (ref 0.57–1.00)
GFR calc Af Amer: 86 mL/min/1.73
GFR calc non Af Amer: 74 mL/min/1.73
Glucose: 88 mg/dL (ref 65–99)
Phosphorus: 3.6 mg/dL (ref 2.5–4.5)
Potassium: 4.2 mmol/L (ref 3.5–5.2)
Sodium: 146 mmol/L — ABNORMAL HIGH (ref 134–144)

## 2018-03-27 LAB — LIPID PANEL
Chol/HDL Ratio: 3.2 ratio (ref 0.0–4.4)
Cholesterol, Total: 155 mg/dL (ref 100–199)
HDL: 49 mg/dL
LDL Calculated: 89 mg/dL (ref 0–99)
Triglycerides: 86 mg/dL (ref 0–149)
VLDL Cholesterol Cal: 17 mg/dL (ref 5–40)

## 2018-04-01 ENCOUNTER — Other Ambulatory Visit: Payer: Self-pay | Admitting: Obstetrics and Gynecology

## 2018-04-01 DIAGNOSIS — N644 Mastodynia: Secondary | ICD-10-CM

## 2018-04-01 DIAGNOSIS — Z1371 Encounter for nonprocreative screening for genetic disease carrier status: Secondary | ICD-10-CM

## 2018-04-01 DIAGNOSIS — N6320 Unspecified lump in the left breast, unspecified quadrant: Secondary | ICD-10-CM

## 2018-04-13 ENCOUNTER — Ambulatory Visit
Admission: RE | Admit: 2018-04-13 | Discharge: 2018-04-13 | Disposition: A | Discharge status: Home / Self Care | Payer: BLUE CROSS/BLUE SHIELD | Source: Ambulatory Visit | Attending: Obstetrics and Gynecology | Admitting: Obstetrics and Gynecology

## 2018-04-13 DIAGNOSIS — N6320 Unspecified lump in the left breast, unspecified quadrant: Secondary | ICD-10-CM

## 2018-04-13 DIAGNOSIS — Z1371 Encounter for nonprocreative screening for genetic disease carrier status: Secondary | ICD-10-CM

## 2018-04-13 DIAGNOSIS — N644 Mastodynia: Secondary | ICD-10-CM

## 2018-04-13 DIAGNOSIS — N6321 Unspecified lump in the left breast, upper outer quadrant: Secondary | ICD-10-CM | POA: Insufficient documentation

## 2018-06-11 ENCOUNTER — Ambulatory Visit
Admission: RE | Admit: 2018-06-11 | Discharge: 2018-06-11 | Disposition: A | Discharge status: Home / Self Care | Payer: BLUE CROSS/BLUE SHIELD | Source: Ambulatory Visit | Attending: Family Medicine | Admitting: Family Medicine

## 2018-06-11 ENCOUNTER — Encounter: Payer: Self-pay | Admitting: Family Medicine

## 2018-06-11 ENCOUNTER — Ambulatory Visit (INDEPENDENT_AMBULATORY_CARE_PROVIDER_SITE_OTHER): Payer: BLUE CROSS/BLUE SHIELD | Admitting: Family Medicine

## 2018-06-11 VITALS — BP 120/70 | HR 64 | Ht 64.0 in | Wt 160.0 lb

## 2018-06-11 DIAGNOSIS — R05 Cough: Secondary | ICD-10-CM | POA: Diagnosis not present

## 2018-06-11 DIAGNOSIS — R059 Cough, unspecified: Secondary | ICD-10-CM

## 2018-06-11 DIAGNOSIS — R519 Headache, unspecified: Secondary | ICD-10-CM

## 2018-06-11 DIAGNOSIS — R51 Headache: Principal | ICD-10-CM

## 2018-06-11 DIAGNOSIS — S0340XA Sprain of jaw, unspecified side, initial encounter: Secondary | ICD-10-CM

## 2018-06-11 DIAGNOSIS — R222 Localized swelling, mass and lump, trunk: Secondary | ICD-10-CM

## 2018-06-11 NOTE — Progress Notes (Signed)
Name: Cheyenne Bean   MRN: [id number]    DOB: 22-Sep-1956   Date:06/11/2018       Progress Note  Subjective  Chief Complaint  Chief Complaint  Patient presents with  . Adenopathy    swollen lymph nodes on R) side of face and near R) collar bone    1. Pain and tenderness of right side of head/swelling concern for lymph node. 2. Right facial discomfortover right maxillary area. 3. Fullness in right supraclavicular area with cough  Cough  This is a new problem. Episode onset: 4 days. The problem has been waxing and waning. The cough is non-productive. Associated symptoms include nasal congestion and postnasal drip. Pertinent negatives include no chest pain, chills, ear pain, fever, headaches, heartburn, myalgias, rash, sore throat, shortness of breath, weight loss or wheezing. The treatment provided mild relief. There is no history of environmental allergies.    No problem-specific Assessment & Plan notes found for this encounter.   Past Medical History:  Diagnosis Date  . BRCA negative 01/2017  . Genital herpes   . Hyperlipidemia   . Hypertension   . IBS (irritable bowel syndrome) 02/20/2017  . Sarcoidosis     Past Surgical History:  Procedure Laterality Date  . BACK SURGERY    . BUNIONECTOMY Left   . COLONOSCOPY  2012   cleared for 10 yrs  . COLONOSCOPY WITH PROPOFOL N/A 02/27/2017   Procedure: COLONOSCOPY WITH PROPOFOL;  Surgeon: Lollie Sails, MD;  Location: [facility] ENDOSCOPY;  Service: Endoscopy;  Laterality: N/A;  . VAGINAL HYSTERECTOMY      Family History  Problem Relation Age of Onset  . Hypertension Mother   . Breast cancer Sister 59  . Cervical cancer Sister 17  . Ovarian cancer Maternal Aunt 60  . Colon cancer Maternal Uncle 52    Social History   Socioeconomic History  . Marital status: Married    Spouse name: Not on file  . Number of children: Not on file  . Years of education: Not on file  . Highest education level: Not on file  Occupational  History  . Not on file  Social Needs  . Financial resource strain: Not on file  . Food insecurity:    Worry: Not on file    Inability: Not on file  . Transportation needs:    Medical: Not on file    Non-medical: Not on file  Tobacco Use  . Smoking status: Never Smoker  . Smokeless tobacco: Never Used  Substance and Sexual Activity  . Alcohol use: No    Alcohol/week: 0.0 oz  . Drug use: No  . Sexual activity: Yes  Lifestyle  . Physical activity:    Days per week: Not on file    Minutes per session: Not on file  . Stress: Not on file  Relationships  . Social connections:    Talks on phone: Not on file    Gets together: Not on file    Attends religious service: Not on file    Active member of club or organization: Not on file    Attends meetings of clubs or organizations: Not on file    Relationship status: Not on file  . Intimate partner violence:    Fear of current or ex partner: Not on file    Emotionally abused: Not on file    Physically abused: Not on file    Forced sexual activity: Not on file  Other Topics Concern  . Not on file  Social History Narrative  . Not on file    No Known Allergies  Outpatient Medications Prior to Visit  Medication Sig Dispense Refill  . lisinopril-hydrochlorothiazide (PRINZIDE,ZESTORETIC) 20-25 MG tablet TAKE ONE TABLET BY MOUTH ONCE DAILY NEED TO SCHEDULE AN APPOINTMENT 30 tablet 11  . meclizine (ANTIVERT) 25 MG tablet Take 1 tablet (25 mg total) by mouth 3 (three) times daily as needed for dizziness. 30 tablet 0  . pravastatin (PRAVACHOL) 20 MG tablet Take 1 tablet (20 mg total) by mouth daily. 30 tablet 11  . Turmeric 500 MG CAPS Take by mouth.    . valACYclovir (VALTREX) 500 MG tablet TAKE ONE CAPLET BY MOUTH ONCE DAILY SCHEDULE  AN  APPOINTMENT 30 tablet 11   No facility-administered medications prior to visit.     Review of Systems  Constitutional: Negative for chills, fever, malaise/fatigue and weight loss.  HENT: Positive  for postnasal drip. Negative for ear discharge, ear pain and sore throat.   Eyes: Negative for blurred vision.  Respiratory: Positive for cough. Negative for sputum production, shortness of breath and wheezing.   Cardiovascular: Negative for chest pain, palpitations and leg swelling.  Gastrointestinal: Negative for abdominal pain, blood in stool, constipation, diarrhea, heartburn, melena and nausea.  Genitourinary: Negative for dysuria, frequency, hematuria and urgency.  Musculoskeletal: Negative for back pain, joint pain, myalgias and neck pain.  Skin: Negative for rash.  Neurological: Negative for dizziness, tingling, sensory change, focal weakness and headaches.  Endo/Heme/Allergies: Negative for environmental allergies and polydipsia. Does not bruise/bleed easily.  Psychiatric/Behavioral: Negative for depression and suicidal ideas. The patient is not nervous/anxious and does not have insomnia.      Objective  Vitals:   06/11/18 1341  BP: 120/70  Pulse: 64  Weight: 160 lb (72.6 kg)  Height: 5' 4" (1.626 m)    Physical Exam  Constitutional: No distress.  HENT:  Head: Normocephalic and atraumatic.  Right Ear: Tympanic membrane and external ear normal.  Left Ear: Tympanic membrane and external ear normal.  Nose: Nose normal. No mucosal edema.  Mouth/Throat: Oropharynx is clear and moist. No oropharyngeal exudate, posterior oropharyngeal edema or posterior oropharyngeal erythema.  Eyes: Pupils are equal, round, and reactive to light. Conjunctivae and EOM are normal. Right eye exhibits no discharge. Left eye exhibits no discharge.  Neck: Normal range of motion. Neck supple. No JVD present. No thyromegaly present.  Cardiovascular: Normal rate, regular rhythm, S1 normal, S2 normal, normal heart sounds and intact distal pulses. Exam reveals no gallop, no S3, no S4, no friction rub and no decreased pulses.  No murmur heard.  No systolic murmur is present.  No diastolic murmur is  present. Pulmonary/Chest: Effort normal and breath sounds normal.  Abdominal: Soft. Bowel sounds are normal. She exhibits no mass. There is no tenderness. There is no guarding.  Musculoskeletal: Normal range of motion. She exhibits no edema.  Lymphadenopathy:    She has no cervical adenopathy.  Neurological: She is alert. She has normal reflexes.  Skin: Skin is warm and dry. She is not diaphoretic.  Nursing note and vitals reviewed.     Assessment & Plan  Problem List Items Addressed This Visit    None    Visit Diagnoses    Facial pain    -  Primary   sinus xray ordered   Relevant Orders   DG SinUS 1-2 Views (Completed)   Ambulatory referral to ENT   Supraclavicular fossa fullness       send to ENT     Relevant Orders   DG Chest 2 View (Completed)   Cough       chest xray ordered   Relevant Orders   DG Chest 2 View (Completed)   Sprain of temporomandibular joint, initial encounter       sinus xray ordered      No orders of the defined types were placed in this encounter.     Dr. Deanna Jones [facility]  06/11/18   

## 2018-07-13 ENCOUNTER — Other Ambulatory Visit: Payer: Self-pay | Admitting: Family Medicine

## 2018-07-13 DIAGNOSIS — A6000 Herpesviral infection of urogenital system, unspecified: Secondary | ICD-10-CM

## 2018-09-08 ENCOUNTER — Telehealth: Payer: Self-pay

## 2018-09-08 ENCOUNTER — Other Ambulatory Visit: Payer: Self-pay

## 2018-09-08 DIAGNOSIS — R42 Dizziness and giddiness: Secondary | ICD-10-CM

## 2018-09-08 MED ORDER — MECLIZINE HCL 25 MG PO TABS: 25.0000 mg | ORAL_TABLET | Freq: Three times a day (TID) | ORAL | 0 refills | Status: DC | PRN

## 2018-09-08 NOTE — Telephone Encounter (Signed)
Pt called in c/o vertigo. She tried to get in today with her ENT doctor, but their next available appt was 3 weeks. I told her we would call in the meclizine, but she needs to make sure they/ENT have her down on the sched for a follow up appt.

## 2018-09-28 ENCOUNTER — Other Ambulatory Visit: Payer: Self-pay

## 2018-09-28 MED ORDER — SERTRALINE HCL 50 MG PO TABS: 50.0000 mg | ORAL_TABLET | Freq: Every day | ORAL | 0 refills | Status: DC

## 2018-10-15 ENCOUNTER — Ambulatory Visit (INDEPENDENT_AMBULATORY_CARE_PROVIDER_SITE_OTHER): Payer: BLUE CROSS/BLUE SHIELD | Admitting: Family Medicine

## 2018-10-15 ENCOUNTER — Encounter: Payer: Self-pay | Admitting: Family Medicine

## 2018-10-15 VITALS — BP 100/70 | HR 68 | Ht 64.0 in | Wt 156.0 lb

## 2018-10-15 DIAGNOSIS — F419 Anxiety disorder, unspecified: Secondary | ICD-10-CM | POA: Diagnosis not present

## 2018-10-15 DIAGNOSIS — Z23 Encounter for immunization: Secondary | ICD-10-CM | POA: Diagnosis not present

## 2018-10-15 DIAGNOSIS — F324 Major depressive disorder, single episode, in partial remission: Secondary | ICD-10-CM

## 2018-10-15 MED ORDER — SERTRALINE HCL 50 MG PO TABS
50.0000 mg | ORAL_TABLET | Freq: Every day | ORAL | 1 refills | Status: DC
Start: 2018-10-15 — End: 2019-08-05

## 2018-10-15 NOTE — Progress Notes (Signed)
Date:  10/15/2018   Name:  Cheyenne Bean   DOB:  [birth year]   MRN:  [id number]   Chief Complaint: Depression (started back on Sertraline 2 weeks ago- feeling better on med PHQ=2) and Flu Vaccine Depression         This is a chronic problem.  The current episode started more than 1 month ago.   The onset quality is sudden.   The problem has been gradually improving since onset.  Associated symptoms include irritable, decreased interest, headaches and sad.  Associated symptoms include no decreased concentration, no fatigue, no helplessness, no hopelessness, does not have insomnia, no myalgias and no indigestion.  Past treatments include SSRIs - Selective serotonin reuptake inhibitors.  Compliance with treatment is good.  Previous treatment provided moderate relief.  Risk factors include a change in medication usage/dosage.   Past medical history includes anxiety.      Review of Systems  Constitutional: Negative.  Negative for chills, fatigue, fever and unexpected weight change.  HENT: Negative for congestion, ear discharge, ear pain, rhinorrhea, sinus pressure, sneezing and sore throat.   Eyes: Negative for photophobia, pain, discharge, redness and itching.  Respiratory: Negative for cough, shortness of breath, wheezing and stridor.   Gastrointestinal: Negative for abdominal pain, blood in stool, constipation, diarrhea, nausea and vomiting.  Endocrine: Negative for cold intolerance, heat intolerance, polydipsia, polyphagia and polyuria.  Genitourinary: Negative for dysuria, flank pain, frequency, hematuria, menstrual problem, pelvic pain, urgency, vaginal bleeding and vaginal discharge.  Musculoskeletal: Negative for arthralgias, back pain and myalgias.  Skin: Negative for rash.  Allergic/Immunologic: Negative for environmental allergies and food allergies.  Neurological: Positive for headaches. Negative for dizziness, weakness, light-headedness and numbness.  Hematological: Negative  for adenopathy. Does not bruise/bleed easily.  Psychiatric/Behavioral: Positive for depression. Negative for decreased concentration and dysphoric mood. The patient is not nervous/anxious and does not have insomnia.     Patient Active Problem List   Diagnosis Date Noted  . Pure hypercholesterolemia 11/04/2016  . Essential hypertension 09/18/2015  . Hyperlipidemia 09/18/2015  . Recurrent genital herpes simplex 09/18/2015    No Known Allergies  Past Surgical History:  Procedure Laterality Date  . BACK SURGERY    . BUNIONECTOMY Left   . COLONOSCOPY  2012   cleared for 10 yrs  . COLONOSCOPY WITH PROPOFOL N/A 02/27/2017   Procedure: COLONOSCOPY WITH PROPOFOL;  Surgeon: Christena Deem, MD;  Location: [facility] ENDOSCOPY;  Service: Endoscopy;  Laterality: N/A;  . VAGINAL HYSTERECTOMY      Social History   Tobacco Use  . Smoking status: Never Smoker  . Smokeless tobacco: Never Used  Substance Use Topics  . Alcohol use: No    Alcohol/week: 0.0 standard drinks  . Drug use: No     Medication list has been reviewed and updated.  Current Meds  Medication Sig  . lisinopril-hydrochlorothiazide (PRINZIDE,ZESTORETIC) 20-25 MG tablet TAKE ONE TABLET BY MOUTH ONCE DAILY NEED TO SCHEDULE AN APPOINTMENT  . meclizine (ANTIVERT) 25 MG tablet Take 1 tablet (25 mg total) by mouth 3 (three) times daily as needed for dizziness.  . pravastatin (PRAVACHOL) 20 MG tablet Take 1 tablet (20 mg total) by mouth daily.  . sertraline (ZOLOFT) 50 MG tablet Take 1 tablet (50 mg total) by mouth daily.  . Turmeric 500 MG CAPS Take by mouth.  . valACYclovir (VALTREX) 500 MG tablet TAKE 1 TABLET BY MOUTH ONCE DAILY SCHEDULE AN APPOINTMENT  . [DISCONTINUED] sertraline (ZOLOFT) 50 MG tablet Take 1 tablet (  50 mg total) by mouth daily.    PHQ 2/9 Scores 10/15/2018 03/16/2018 05/05/2017 05/23/2016  PHQ - 2 Score 1 1 2  0  PHQ- 9 Score 2 3 9  -    Physical Exam  Constitutional: She is irritable. No distress.  HENT:    Head: Normocephalic and atraumatic.  Right Ear: External ear normal.  Left Ear: External ear normal.  Nose: Nose normal.  Mouth/Throat: Oropharynx is clear and moist.  Eyes: Pupils are equal, round, and reactive to light. Conjunctivae and EOM are normal. Right eye exhibits no discharge. Left eye exhibits no discharge.  Neck: Normal range of motion. Neck supple. No JVD present. No thyromegaly present.  Cardiovascular: Normal rate, regular rhythm, normal heart sounds and intact distal pulses. Exam reveals no gallop and no friction rub.  No murmur heard. Pulmonary/Chest: Effort normal and breath sounds normal.  Abdominal: Soft. Bowel sounds are normal. She exhibits no mass. There is no tenderness. There is no guarding.  Musculoskeletal: Normal range of motion. She exhibits no edema.  Lymphadenopathy:    She has no cervical adenopathy.  Neurological: She is alert. She has normal reflexes.  Skin: Skin is warm and dry. She is not diaphoretic.  Nursing note and vitals reviewed.   BP 100/70   Pulse 68   Ht 5\' 4"  (1.626 m)   Wt 156 lb (70.8 kg)   BMI 26.78 kg/m   Assessment and Plan:  1. Major depressive disorder in partial remission, unspecified whether recurrent (HCC) Stable on med- refill Sertraline as directed - sertraline (ZOLOFT) 50 MG tablet; Take 1 tablet (50 mg total) by mouth daily.  Dispense: 90 tablet; Refill: 1  2. Anxiety Stable on med- refill Sertraline - sertraline (ZOLOFT) 50 MG tablet; Take 1 tablet (50 mg total) by mouth daily.  Dispense: 90 tablet; Refill: 1  3. Influenza vaccine needed administered - Flu Vaccine QUAD 36+ mos IM   Dr. Elizabeth Sauer [facility]  10/15/2018

## 2018-11-24 ENCOUNTER — Ambulatory Visit (INDEPENDENT_AMBULATORY_CARE_PROVIDER_SITE_OTHER): Payer: BLUE CROSS/BLUE SHIELD | Admitting: Obstetrics and Gynecology

## 2018-11-24 ENCOUNTER — Encounter: Payer: Self-pay | Admitting: Obstetrics and Gynecology

## 2018-11-24 VITALS — BP 126/87 | HR 73 | Ht 65.0 in | Wt 158.0 lb

## 2018-11-24 DIAGNOSIS — Z01419 Encounter for gynecological examination (general) (routine) without abnormal findings: Secondary | ICD-10-CM | POA: Diagnosis not present

## 2018-11-24 DIAGNOSIS — Z1239 Encounter for other screening for malignant neoplasm of breast: Secondary | ICD-10-CM

## 2018-11-24 MED ORDER — PRASTERONE 6.5 MG VA INST: 6.5000 mg | VAGINAL_INSERT | Freq: Every day | VAGINAL | 11 refills | Status: DC

## 2018-11-24 NOTE — Progress Notes (Signed)
Gynecology Annual Exam  PCP: Juline Patch, MD  Chief Complaint:  Chief Complaint  Patient presents with  . Gynecologic Exam    breast lump tenderness    History of Present Illness:Patient is a 62 y.o. G1P0010 presents for annual exam. The patient has no complaints today.   LMP: No LMP recorded. Patient has had a hysterectomy.  The patient is sexually active. She denies dyspareunia.  The patient does perform self breast exams.  There is notable family history of breast or ovarian cancer in her family.  The patient wears seatbelts: yes.   The patient has regular exercise: not asked.    The patient denies current symptoms of depression.     Review of Systems: Review of Systems  Constitutional: Negative for chills and fever.  HENT: Negative for congestion.   Respiratory: Negative for cough and shortness of breath.   Cardiovascular: Negative for chest pain and palpitations.  Gastrointestinal: Negative for abdominal pain, constipation, diarrhea, heartburn, nausea and vomiting.  Genitourinary: Negative for dysuria, frequency and urgency.  Skin: Negative for itching and rash.  Neurological: Negative for dizziness and headaches.  Endo/Heme/Allergies: Negative for polydipsia.  Psychiatric/Behavioral: Negative for depression.    Past Medical History:  Past Medical History:  Diagnosis Date  . BRCA negative 01/2017  . Genital herpes   . Hyperlipidemia   . Hypertension   . IBS (irritable bowel syndrome) 02/20/2017  . Sarcoidosis     Past Surgical History:  Past Surgical History:  Procedure Laterality Date  . BACK SURGERY    . BUNIONECTOMY Left   . COLONOSCOPY  2012   cleared for 10 yrs  . COLONOSCOPY WITH PROPOFOL N/A 02/27/2017   Procedure: COLONOSCOPY WITH PROPOFOL;  Surgeon: Lollie Sails, MD;  Location: [facility] ENDOSCOPY;  Service: Endoscopy;  Laterality: N/A;  . VAGINAL HYSTERECTOMY      Gynecologic History:  No LMP recorded. Patient has had a  hysterectomy. Last Pap: Results were:  N/A  Last mammogram: Diagnostic 04/13/2018 Results were: BI-RAD II  Obstetric History: G1P0010  Family History:  Family History  Problem Relation Age of Onset  . Hypertension Mother   . Breast cancer Sister 48  . Cervical cancer Sister 56  . Ovarian cancer Maternal Aunt 60  . Colon cancer Maternal Uncle 35    Social History:  Social History   Socioeconomic History  . Marital status: Married    Spouse name: Not on file  . Number of children: Not on file  . Years of education: Not on file  . Highest education level: Not on file  Occupational History  . Not on file  Social Needs  . Financial resource strain: Not on file  . Food insecurity:    Worry: Not on file    Inability: Not on file  . Transportation needs:    Medical: Not on file    Non-medical: Not on file  Tobacco Use  . Smoking status: Never Smoker  . Smokeless tobacco: Never Used  Substance and Sexual Activity  . Alcohol use: No    Alcohol/week: 0.0 standard drinks  . Drug use: No  . Sexual activity: Yes  Lifestyle  . Physical activity:    Days per week: Not on file    Minutes per session: Not on file  . Stress: Not on file  Relationships  . Social connections:    Talks on phone: Not on file    Gets together: Not on file    Attends religious  service: Not on file    Active member of club or organization: Not on file    Attends meetings of clubs or organizations: Not on file    Relationship status: Not on file  . Intimate partner violence:    Fear of current or ex partner: Not on file    Emotionally abused: Not on file    Physically abused: Not on file    Forced sexual activity: Not on file  Other Topics Concern  . Not on file  Social History Narrative  . Not on file    Allergies:  No Known Allergies  Medications: Prior to Admission medications   Medication Sig Start Date End Date Taking? Authorizing Provider  lisinopril-hydrochlorothiazide  (PRINZIDE,ZESTORETIC) 20-25 MG tablet TAKE ONE TABLET BY MOUTH ONCE DAILY NEED TO SCHEDULE AN APPOINTMENT 03/16/18   Juline Patch, MD  meclizine (ANTIVERT) 25 MG tablet Take 1 tablet (25 mg total) by mouth 3 (three) times daily as needed for dizziness. 09/08/18   Juline Patch, MD  pravastatin (PRAVACHOL) 20 MG tablet Take 1 tablet (20 mg total) by mouth daily. 03/16/18   Juline Patch, MD  sertraline (ZOLOFT) 50 MG tablet Take 1 tablet (50 mg total) by mouth daily. 10/15/18   Juline Patch, MD  Turmeric 500 MG CAPS Take by mouth.    [provider]  valACYclovir (VALTREX) 500 MG tablet TAKE 1 TABLET BY MOUTH ONCE DAILY SCHEDULE AN APPOINTMENT 07/13/18   Juline Patch, MD    Physical Exam Vitals: Blood pressure 126/87, pulse 73, height '5\' 5"'  (1.651 m), weight 158 lb (71.7 kg).  General: NAD HEENT: normocephalic, anicteric Thyroid: no enlargement, no palpable nodules Pulmonary: No increased work of breathing, CTAB Cardiovascular: RRR, distal pulses 2+ Breast: Breast symmetrical, no tenderness, no palpable nodules or masses, no skin or nipple retraction present, no nipple discharge.  No axillary or supraclavicular lymphadenopathy. Abdomen: NABS, soft, non-tender, non-distended.  Umbilicus without lesions.  No hepatomegaly, splenomegaly or masses palpable. No evidence of hernia  Genitourinary:  External: Normal external female genitalia.  Normal urethral meatus, normal Bartholin's and Skene's glands.    Vagina: Normal vaginal mucosa, no evidence of prolapse.    Cervix: surgically absent  Uterus: surgically absent  Adnexa: ovaries non-enlarged, no adnexal masses  Rectal: deferred  Lymphatic: no evidence of inguinal lymphadenopathy Extremities: no edema, erythema, or tenderness Neurologic: Grossly intact Psychiatric: mood appropriate, affect full  Female chaperone present for pelvic and breast  portions of the physical exam     Assessment: 62 y.o. G1P0010 routine annual  exam  Plan: Problem List Items Addressed This Visit    None    Visit Diagnoses    Breast screening    -  Primary   Relevant Orders   MM 3D SCREEN BREAST BILATERAL   Encounter for gynecological examination without abnormal finding          1) Mammogram - recommend yearly screening mammogram.  Mammogram Is up to date - Myriad MyRisk negative 01/12/17 with IBIS 10.7% - Next mammogram due in April 2020  2) STI screening  was notoffered and therefore not obtained  3) ASCCP guidelines and rational discussed.  Patient opts for discontinue secondary to prior hysterectomy screening interval  4) Osteoporosis  - per USPTF routine screening DEXA at age 51  5) Routine healthcare maintenance including cholesterol, diabetes screening discussed managed by PCP  6) Colonoscopy - up to date 02/27/17 [place]  7) Dysparunia vaginal dryness - Rx Intrarosa with  sample at [facility] office next week  8) Return in about 1 year (around 11/25/2019) for annual.    Malachy Mood, MD [place], [place] 11/24/2018, 11:05 AM

## 2018-11-24 NOTE — Patient Instructions (Signed)
[facility] [address] [place] [address]  [facility] [place]  [address] [place] [address]  Phone: [phone]   Your next Mammogram is due in April 2020

## 2018-12-01 ENCOUNTER — Encounter: Payer: Self-pay | Admitting: Obstetrics and Gynecology

## 2019-06-03 ENCOUNTER — Other Ambulatory Visit: Payer: Self-pay | Admitting: Family Medicine

## 2019-06-03 DIAGNOSIS — E7800 Pure hypercholesterolemia, unspecified: Secondary | ICD-10-CM

## 2019-06-03 DIAGNOSIS — I10 Essential (primary) hypertension: Secondary | ICD-10-CM

## 2019-06-18 ENCOUNTER — Other Ambulatory Visit: Payer: Self-pay | Admitting: Family Medicine

## 2019-06-18 DIAGNOSIS — Z20822 Contact with and (suspected) exposure to covid-19: Secondary | ICD-10-CM

## 2019-06-24 ENCOUNTER — Telehealth: Payer: Self-pay | Admitting: Hematology

## 2019-06-24 LAB — NOVEL CORONAVIRUS, NAA: SARS-CoV-2, NAA: NOT DETECTED

## 2019-06-24 NOTE — Telephone Encounter (Signed)
Pt is calling and she is aware covid 19 results negative °

## 2019-07-04 ENCOUNTER — Ambulatory Visit
Admission: RE | Admit: 2019-07-04 | Discharge: 2019-07-04 | Disposition: A | Discharge status: Home / Self Care | Payer: BC Managed Care – PPO | Source: Ambulatory Visit | Attending: Obstetrics and Gynecology | Admitting: Obstetrics and Gynecology

## 2019-07-04 ENCOUNTER — Other Ambulatory Visit: Payer: Self-pay

## 2019-07-04 DIAGNOSIS — Z1231 Encounter for screening mammogram for malignant neoplasm of breast: Secondary | ICD-10-CM | POA: Insufficient documentation

## 2019-07-04 DIAGNOSIS — Z1239 Encounter for other screening for malignant neoplasm of breast: Secondary | ICD-10-CM

## 2019-07-12 ENCOUNTER — Telehealth: Payer: Self-pay

## 2019-07-12 NOTE — Telephone Encounter (Signed)
Pt calling for mammogram results.  [phone]  Pt aware of normal results.  She did not receive AMS's msg thru MyChart as she hasn't initiated MyChart.

## 2019-07-30 ENCOUNTER — Other Ambulatory Visit: Payer: Self-pay | Admitting: Family Medicine

## 2019-07-30 DIAGNOSIS — I10 Essential (primary) hypertension: Secondary | ICD-10-CM

## 2019-07-30 DIAGNOSIS — E7800 Pure hypercholesterolemia, unspecified: Secondary | ICD-10-CM

## 2019-08-05 ENCOUNTER — Other Ambulatory Visit: Payer: Self-pay

## 2019-08-05 ENCOUNTER — Ambulatory Visit (INDEPENDENT_AMBULATORY_CARE_PROVIDER_SITE_OTHER): Payer: BC Managed Care – PPO | Admitting: Family Medicine

## 2019-08-05 ENCOUNTER — Encounter: Payer: Self-pay | Admitting: Family Medicine

## 2019-08-05 VITALS — BP 120/80 | HR 80 | Ht 65.0 in | Wt 155.0 lb

## 2019-08-05 DIAGNOSIS — I10 Essential (primary) hypertension: Secondary | ICD-10-CM | POA: Diagnosis not present

## 2019-08-05 DIAGNOSIS — S0340XA Sprain of jaw, unspecified side, initial encounter: Secondary | ICD-10-CM | POA: Diagnosis not present

## 2019-08-05 DIAGNOSIS — E7800 Pure hypercholesterolemia, unspecified: Secondary | ICD-10-CM

## 2019-08-05 DIAGNOSIS — M7591 Shoulder lesion, unspecified, right shoulder: Secondary | ICD-10-CM | POA: Diagnosis not present

## 2019-08-05 DIAGNOSIS — R69 Illness, unspecified: Secondary | ICD-10-CM

## 2019-08-05 DIAGNOSIS — F324 Major depressive disorder, single episode, in partial remission: Secondary | ICD-10-CM

## 2019-08-05 DIAGNOSIS — A6000 Herpesviral infection of urogenital system, unspecified: Secondary | ICD-10-CM

## 2019-08-05 DIAGNOSIS — F419 Anxiety disorder, unspecified: Secondary | ICD-10-CM

## 2019-08-05 MED ORDER — LISINOPRIL-HYDROCHLOROTHIAZIDE 20-25 MG PO TABS: 1.0000 | ORAL_TABLET | Freq: Every day | ORAL | 1 refills | Status: DC

## 2019-08-05 MED ORDER — DICLOFENAC SODIUM 1 % TD GEL: 2.0000 g | Freq: Four times a day (QID) | TRANSDERMAL | 1 refills | Status: DC

## 2019-08-05 MED ORDER — VALACYCLOVIR HCL 500 MG PO TABS: ORAL_TABLET | ORAL | 5 refills | Status: DC

## 2019-08-05 MED ORDER — PRAVASTATIN SODIUM 20 MG PO TABS: 20.0000 mg | ORAL_TABLET | Freq: Every day | ORAL | 1 refills | Status: DC

## 2019-08-05 MED ORDER — SERTRALINE HCL 50 MG PO TABS: 50.0000 mg | ORAL_TABLET | Freq: Every day | ORAL | 1 refills | Status: DC

## 2019-08-05 NOTE — Progress Notes (Signed)
Date:  08/05/2019   Name:  Cheyenne Bean   DOB:  05/18/1956   MRN:  [id number]   Chief Complaint: Hypertension, Hyperlipidemia, Depression (phq9=0), and viral syndrome  Hypertension This is a chronic problem. The current episode started more than 1 year ago. The problem has been gradually improving since onset. The problem is controlled. Pertinent negatives include no anxiety, blurred vision, chest pain, headaches, malaise/fatigue, neck pain, orthopnea, palpitations, peripheral edema, PND, shortness of breath or sweats. There are no associated agents to hypertension. There are no known risk factors for coronary artery disease. Past treatments include ACE inhibitors and diuretics. The current treatment provides moderate improvement. There are no compliance problems.  There is no history of angina, kidney disease, CAD/MI, CVA, heart failure, left ventricular hypertrophy, PVD or retinopathy. There is no history of chronic renal disease, a hypertension causing med or renovascular disease.  Hyperlipidemia This is a chronic problem. The problem is controlled. Recent lipid tests were reviewed and are normal. She has no history of chronic renal disease, diabetes, hypothyroidism, liver disease or obesity. Factors aggravating her hyperlipidemia include thiazides. Pertinent negatives include no chest pain, focal sensory loss, focal weakness, leg pain, myalgias or shortness of breath. Current antihyperlipidemic treatment includes statins. The current treatment provides moderate improvement of lipids. There are no compliance problems.  Risk factors for coronary artery disease include hypertension.  Depression        The onset quality is gradual.   The problem has been gradually improving since onset.  Associated symptoms include no decreased concentration, no fatigue, no helplessness, no hopelessness, does not have insomnia, not irritable, no restlessness, no decreased interest, no appetite change, no body  aches, no myalgias, no headaches, no indigestion, not sad and no suicidal ideas.     The symptoms are aggravated by work stress.  Past treatments include SSRIs - Selective serotonin reuptake inhibitors.  Compliance with treatment is good.   Pertinent negatives include no hypothyroidism and no anxiety. Neck Pain  This is a recurrent problem. The current episode started more than 1 month ago. The problem occurs intermittently. The problem has been waxing and waning. The pain is associated with nothing. The pain is present in the right side (supraclavicular). The quality of the pain is described as aching. The pain is mild. Nothing aggravates the symptoms. The pain is same all the time. Pertinent negatives include no chest pain, fever, headaches, leg pain, numbness, pain with swallowing, paresis, photophobia, syncope, tingling, trouble swallowing, visual change, weakness or weight loss. She has tried nothing for the symptoms. The treatment provided mild relief.    Review of Systems  Constitutional: Negative.  Negative for appetite change, chills, fatigue, fever, malaise/fatigue, unexpected weight change and weight loss.  HENT: Negative for congestion, ear discharge, ear pain, rhinorrhea, sinus pressure, sneezing, sore throat and trouble swallowing.   Eyes: Negative for blurred vision, photophobia, pain, discharge, redness and itching.  Respiratory: Negative for cough, shortness of breath, wheezing and stridor.   Cardiovascular: Negative for chest pain, palpitations, orthopnea, syncope and PND.  Gastrointestinal: Negative for abdominal pain, blood in stool, constipation, diarrhea, nausea and vomiting.  Endocrine: Negative for cold intolerance, heat intolerance, polydipsia, polyphagia and polyuria.  Genitourinary: Negative for dysuria, flank pain, frequency, hematuria, menstrual problem, pelvic pain, urgency, vaginal bleeding and vaginal discharge.  Musculoskeletal: Negative for arthralgias, back pain,  myalgias and neck pain.  Skin: Negative for rash.  Allergic/Immunologic: Negative for environmental allergies and food allergies.  Neurological: Negative for dizziness,  tingling, focal weakness, weakness, light-headedness, numbness and headaches.  Hematological: Negative for adenopathy. Does not bruise/bleed easily.  Psychiatric/Behavioral: Positive for depression. Negative for decreased concentration, dysphoric mood and suicidal ideas. The patient is not nervous/anxious and does not have insomnia.     Patient Active Problem List   Diagnosis Date Noted  . Pure hypercholesterolemia 11/04/2016  . Essential hypertension 09/18/2015  . Hyperlipidemia 09/18/2015  . Recurrent genital herpes simplex 09/18/2015    No Known Allergies  Past Surgical History:  Procedure Laterality Date  . BACK SURGERY    . BUNIONECTOMY Left   . COLONOSCOPY  2012   cleared for 10 yrs  . COLONOSCOPY WITH PROPOFOL N/A 02/27/2017   Procedure: COLONOSCOPY WITH PROPOFOL;  Surgeon: Christena DeemMartin U [place], MD;  Location: [facility] ENDOSCOPY;  Service: Endoscopy;  Laterality: N/A;  . VAGINAL HYSTERECTOMY      Social History   Tobacco Use  . Smoking status: Never Smoker  . Smokeless tobacco: Never Used  Substance Use Topics  . Alcohol use: No    Alcohol/week: 0.0 standard drinks  . Drug use: No     Medication list has been reviewed and updated.  Current Meds  Medication Sig  . lisinopril-hydrochlorothiazide (ZESTORETIC) 20-25 MG tablet Take 1 tablet by mouth once daily  . meclizine (ANTIVERT) 25 MG tablet Take 1 tablet (25 mg total) by mouth 3 (three) times daily as needed for dizziness.  . pravastatin (PRAVACHOL) 20 MG tablet Take 1 tablet by mouth once daily  . sertraline (ZOLOFT) 50 MG tablet Take 1 tablet (50 mg total) by mouth daily.  . Turmeric 500 MG CAPS Take by mouth.  . valACYclovir (VALTREX) 500 MG tablet TAKE 1 TABLET BY MOUTH ONCE DAILY SCHEDULE AN APPOINTMENT    PHQ 2/9 Scores 08/05/2019 10/15/2018  03/16/2018 05/05/2017  PHQ - 2 Score 0 1 1 2   PHQ- 9 Score 0 2 3 9     BP Readings from Last 3 Encounters:  08/05/19 120/80  11/24/18 126/87  10/15/18 100/70    Physical Exam Vitals signs and nursing note reviewed.  Constitutional:      General: She is not irritable.She is not in acute distress.    Appearance: She is not diaphoretic.  HENT:     Head: Normocephalic and atraumatic.     Jaw: There is normal jaw occlusion. Tenderness present.     Comments: Tenderness right TMJ    Right Ear: Tympanic membrane, ear canal and external ear normal.     Left Ear: Tympanic membrane, ear canal and external ear normal.     Nose: Nose normal.  Eyes:     General:        Right eye: No discharge.        Left eye: No discharge.     Conjunctiva/sclera: Conjunctivae normal.     Pupils: Pupils are equal, round, and reactive to light.  Neck:     Musculoskeletal: Normal range of motion and neck supple. Muscular tenderness present.     Thyroid: No thyroid mass, thyromegaly or thyroid tenderness.     Vascular: No JVD.      Comments: Tender supraclavicuar mm Cardiovascular:     Rate and Rhythm: Normal rate and regular rhythm.     Heart sounds: Normal heart sounds. No murmur. No friction rub. No gallop.   Pulmonary:     Effort: Pulmonary effort is normal.     Breath sounds: Normal breath sounds.  Abdominal:     General: Bowel sounds are normal.  Palpations: Abdomen is soft. There is no mass.     Tenderness: There is no abdominal tenderness. There is no guarding.  Musculoskeletal: Normal range of motion.  Lymphadenopathy:     Cervical: No cervical adenopathy.  Skin:    General: Skin is warm and dry.  Neurological:     Mental Status: She is alert.     Deep Tendon Reflexes: Reflexes are normal and symmetric.     Wt Readings from Last 3 Encounters:  08/05/19 155 lb (70.3 kg)  11/24/18 158 lb (71.7 kg)  10/15/18 156 lb (70.8 kg)    BP 120/80   Pulse 80   Ht 5\' 5"  (1.651 m)   Wt 155  lb (70.3 kg)   BMI 25.79 kg/m   Assessment and Plan:  1. Essential hypertension Chronic.  Controlled.  Continue lisinopril hydrochlorothiazide 20-25 once a day.  Will check renal function panel today. - Renal Function Panel - lisinopril-hydrochlorothiazide (ZESTORETIC) 20-25 MG tablet; Take 1 tablet by mouth daily.  Dispense: 91 tablet; Refill: 1  2. Pure hypercholesterolemia Chronic.  Controlled.  Continue pravastatin 20 mg once a day will check lipid panel. - Lipid Panel With LDL/HDL Ratio - pravastatin (PRAVACHOL) 20 MG tablet; Take 1 tablet (20 mg total) by mouth daily.  Dispense: 90 tablet; Refill: 1  3. Major depressive disorder in partial remission, unspecified whether recurrent (HCC) PHQ score today was 0.  This is chronic persistent but controlled we will continue sertraline 50 mg once a day. - sertraline (ZOLOFT) 50 MG tablet; Take 1 tablet (50 mg total) by mouth daily.  Dispense: 90 tablet; Refill: 1  4. Anxiety Chronic controlled continue sertraline 50 mg once a day. - sertraline (ZOLOFT) 50 MG tablet; Take 1 tablet (50 mg total) by mouth daily.  Dispense: 90 tablet; Refill: 1  5. Recurrent genital herpes simplex Patient has history of recurrent herpes simplex 2.  Will treat with daily Valtrex 500 mg. - valACYclovir (VALTREX) 500 MG tablet; TAKE 1 TABLET BY MOUTH ONCE DAILY SCHEDULE AN APPOINTMENT  Dispense: 30 tablet; Refill: 5  6. Sprain of temporomandibular joint, initial encounter Patient has tenderness over the right TMJ consistent with a sprain of the TMJ joint and then adding an NSAID of suggested maybe getting a little bit of Voltaren gel to use over the spot as well as over the supraspinatus muscle  7. Supraspinatus tendinitis, right Patient has tenderness over the supraspinatus with tendinitis proximally will use over-the-counter Voltaren. - diclofenac sodium (VOLTAREN) 1 % GEL; Apply 2 g topically 4 (four) times daily.  Dispense: 50 g; Refill: 1  8. Taking  medication for chronic disease Patient is on a statin and will check hepatic function to rule out possibility of hepatotoxicity. - Hepatic function panel

## 2019-08-06 LAB — RENAL FUNCTION PANEL
Albumin: 4.6 g/dL (ref 3.8–4.9)
BUN/Creatinine Ratio: 25 (ref 12–28)
BUN: 20 mg/dL (ref 8–27)
CO2: 26 mmol/L (ref 20–29)
Calcium: 9.8 mg/dL (ref 8.7–10.3)
Chloride: 100 mmol/L (ref 96–106)
Creatinine, Ser: 0.81 mg/dL (ref 0.57–1.00)
GFR calc Af Amer: 91 mL/min/{1.73_m2} (ref >59)
GFR calc non Af Amer: 79 mL/min/{1.73_m2} (ref >59)
Glucose: 93 mg/dL (ref 65–99)
Phosphorus: 3.5 mg/dL (ref 3.0–4.3)
Potassium: 3.5 mmol/L (ref 3.5–5.2)
Sodium: 141 mmol/L (ref 134–144)

## 2019-08-06 LAB — HEPATIC FUNCTION PANEL
ALT: 24 IU/L (ref 0–32)
AST: 21 IU/L (ref 0–40)
Alkaline Phosphatase: 101 IU/L (ref 39–117)
Bilirubin Total: 0.3 mg/dL (ref 0.0–1.2)
Bilirubin, Direct: 0.09 mg/dL (ref 0.00–0.40)
Total Protein: 7.8 g/dL (ref 6.0–8.5)

## 2019-08-06 LAB — LIPID PANEL WITH LDL/HDL RATIO
Cholesterol, Total: 187 mg/dL (ref 100–199)
HDL: 51 mg/dL (ref >39)
LDL Calculated: 119 mg/dL — ABNORMAL HIGH (ref 0–99)
LDl/HDL Ratio: 2.3 ratio (ref 0.0–3.2)
Triglycerides: 84 mg/dL (ref 0–149)
VLDL Cholesterol Cal: 17 mg/dL (ref 5–40)

## 2019-11-07 ENCOUNTER — Ambulatory Visit: Payer: BC Managed Care – PPO

## 2019-11-08 ENCOUNTER — Other Ambulatory Visit: Payer: Self-pay | Admitting: Otolaryngology

## 2019-11-08 ENCOUNTER — Other Ambulatory Visit: Payer: Self-pay

## 2019-11-08 ENCOUNTER — Ambulatory Visit (INDEPENDENT_AMBULATORY_CARE_PROVIDER_SITE_OTHER): Payer: BC Managed Care – PPO

## 2019-11-08 DIAGNOSIS — H9191 Unspecified hearing loss, right ear: Secondary | ICD-10-CM

## 2019-11-08 DIAGNOSIS — Z23 Encounter for immunization: Secondary | ICD-10-CM

## 2019-11-08 DIAGNOSIS — H8302 Labyrinthitis, left ear: Secondary | ICD-10-CM

## 2019-11-09 ENCOUNTER — Other Ambulatory Visit: Payer: Self-pay | Admitting: Otolaryngology

## 2019-11-09 DIAGNOSIS — H90A21 Sensorineural hearing loss, unilateral, right ear, with restricted hearing on the contralateral side: Secondary | ICD-10-CM

## 2019-11-09 DIAGNOSIS — H8302 Labyrinthitis, left ear: Secondary | ICD-10-CM

## 2019-11-10 ENCOUNTER — Encounter: Payer: Self-pay | Admitting: Physical Therapy

## 2019-11-10 ENCOUNTER — Other Ambulatory Visit: Payer: Self-pay

## 2019-11-10 ENCOUNTER — Ambulatory Visit: Payer: BC Managed Care – PPO | Attending: Otolaryngology | Admitting: Physical Therapy

## 2019-11-10 DIAGNOSIS — R2681 Unsteadiness on feet: Secondary | ICD-10-CM | POA: Diagnosis present

## 2019-11-10 DIAGNOSIS — R42 Dizziness and giddiness: Secondary | ICD-10-CM

## 2019-11-10 NOTE — Therapy (Signed)
[facility] [address] [place], [place], [address] Phone: [phone]   Fax:  [phone]  Physical Therapy Evaluation  Patient Details  Name: Cheyenne Bean MRN: [id number] Date of Birth: [birth year] Referring Provider (PT): Dr. Ladene Artist   Encounter Date: 11/10/2019  PT End of Session - 11/11/19 1114    Visit Number  1    Number of Visits  13    Date for PT Re-Evaluation  02/02/20    PT Start Time  1257    PT Stop Time  1355    PT Time Calculation (min)  58 min    Equipment Utilized During Treatment  Gait belt    Activity Tolerance  Patient tolerated treatment well    Behavior During Therapy  [facility] for tasks assessed/performed       Past Medical History:  Diagnosis Date  . BRCA negative 01/2017   MyRisk neg  . Family history of breast cancer    1/18 IBIS=10.7%  . Family history of ovarian cancer    MyRisk neg  . Genital herpes   . Hyperlipidemia   . Hypertension   . IBS (irritable bowel syndrome) 02/20/2017  . Sarcoidosis     Past Surgical History:  Procedure Laterality Date  . BACK SURGERY    . BUNIONECTOMY Left   . COLONOSCOPY  2012   cleared for 10 yrs  . COLONOSCOPY WITH PROPOFOL N/A 02/27/2017   Procedure: COLONOSCOPY WITH PROPOFOL;  Surgeon: Lollie Sails, MD;  Location: [facility] ENDOSCOPY;  Service: Endoscopy;  Laterality: N/A;  . VAGINAL HYSTERECTOMY      There were no vitals filed for this visit.    [facility] PT Assessment - 11/11/19 1126      Assessment   Medical Diagnosis  left labryinthitis    Referring Provider (PT)  Dr. Ladene Artist      Precautions   Precautions  Fall      Restrictions   Weight Bearing Restrictions  No      Balance Screen   Has the patient fallen in the past 6 months  No    Has the patient had a decrease in activity level because of a fear of falling?   No    Is the patient reluctant to leave their home because of a fear of falling?   No      Prior Function   Level of  Independence  Independent with community mobility without device      Cognition   Overall Cognitive Status  Within Functional Limits for tasks assessed      Dynamic Gait Index   Level Surface  Normal    Change in Gait Speed  Normal    Gait with Horizontal Head Turns  Mild Impairment    Gait with Vertical Head Turns  Mild Impairment    Gait and Pivot Turn  Normal   recreated dizziness left greater than right   Step Over Obstacle  Normal    Step Around Obstacles  Normal    Steps  Mild Impairment    Total Score  21        VESTIBULAR AND BALANCE EVALUATION   HISTORY:  Subjective history of current problem:  Patient reports the dizziness first began about 11 years ago. Patient reports she told her primary doctor, Dr. Ronnald Ramp about her dizziness symptoms and she was referred to the ENT last year. Patient reports her symptoms come and go. Patient reports her dizziness symptoms are now happening every  day. Patient reports when she looks up, turning quickly, bending over, walking and quick movements bring on her symptoms. Patient reports meclizine helps decrease the vertigo, but not the dizziness help decrease her symptoms. Patient reports veering when she walks at times. Patient describes oscillopsia and states that when she blinks her eyes she feels that her eyes do not come back into focus. Patient reports she has had vertigo at times as well. Patient reports that the vertigo has improved and now is not as frequent. Patient reports she had a VNG test last week. 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 Patient has a brain MRI ordered for 12/4. Patient reports she avoids movements because she will get dizziness and nausea. Patient reports she has been out of work since November 1st due to her symptoms. 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 Patient reports that she still  does dishes, walks the dog, washes clothes at home, but states she has problems doing these activities due to her dizziness.   Description of dizziness: vertigo, unsteadiness, lightheadedness, aural fullness and nausea Frequency: dizziness daily; vertigo has improved a lot as she has decreased frequency now- reports last episode was 3 days ago Duration: vertigo lasts a few days; reports the dizziness is continuous  Symptom nature: (motion provoked, constant-reports recently the dizziness is constant, variable, intermittent   Progression of symptoms: no change since onset History of similar episodes: yes  Falls (yes/no):no Number of falls in past 6 months: denies falls but states she leans to the right when she is having vertigo and reaches for support.   Auditory complaints (tinnitus, pain, drainage): tinnitus both ears and hearing loss in the right ear. Denies other issues Vision (last eye exam, diplopia, recent changes): patient wears glasses; feels her vision has worsened. Patient went to see the eye doctor last year and is due to get another.  Patient has sarcoidosis and has a breathing test scheduled in January.     EXAMINATION   SOMATOSENSORY:  Any N & T in extremities or weakness: denies      COORDINATION: Finger to Nose: Normal Past Pointing:   Normal  MUSCULOSKELETAL SCREEN: Cervical Spine ROM: Cervical AROM WNL without pain  Functional Mobility: independent with bed mobility and transfers  Gait: Patient arrives ambulating without AD. Patient ambulates with fair cadence with fair scanning of visual environment with gait.  Balance: Patient is challenged by uneven surfaces, eyes closed, narrow base of support and head turning activities  POSTURAL CONTROL TESTS:  Clinical Test of Sensory Interaction for Balance (CTSIB):  CONDITION TIME STRATEGY SWAY  Eyes open, firm surface 30 seconds ankle +1  Eyes closed, firm surface 30 seconds ankle +2  Eyes open, foam surface  30 seconds ankle +2  Eyes closed, foam surface 4 seconds ankle +4    OCULOMOTOR / VESTIBULAR TESTING:  Oculomotor Exam- Room Light  Normal Abnormal Comments  Ocular Alignment N    Ocular ROM N    Spontaneous Nystagmus N    Gaze evoked Nystagmus N    Smooth Pursuit  Abn Saccadic movements; recreates dizziness  Saccades  Abn Mild hypometric saccades noted in all fields  VOR  Abn Reports dizziness and doubling of one of the lines of the target  VOR Cancellation  Abn Recreates dizziness; denies blurring  Left Head Impulse N    Right Head Impulse N  BPPV TESTS:  Symptoms Duration Intensity Nystagmus  Left Dix-Hallpike None  N/A None observed  Right Dix-Hallpike None  N/A None observed  Left Head Roll None  N/A None observed  Right Head Roll None  N/A None observed    FUNCTIONAL OUTCOME MEASURES:  Results Comments  DHI     78/100 Severe perception of handicap; in need of intervention  ABC Scale     03.75%   High falls risk; in need of intervention  DGI       21 /24 Falls risk; in need of intervention    VOR X 1 exercise:   Demonstrated and educated as to VOR X1.  Patient performed VOR X 1 horizontal in sitting 2 reps of  30 seconds and 1 rep of 1 minute each with verbal cues for technique.  Patient reports one line of the X target is doubling whenever she turns her head to the left and reports 5/10 dizziness with 30 sec reps and 7/10 dizziness with 1 minute rep. Issued VOR X 1 with 30 second reps for HEP.   Non-compliant/ Firm Surface: On firm surface, patient performed feet together with horizontal and vertical head turns.     PT Education - 11/11/19 1113    Education Details  discussed plan of care and goals; issued VOR X 1 in sitting 30 seconds and feet together on firm with horizontal and vertical head turns    Person(s) Educated  Patient    Methods  Explanation;Demonstration;Handout;Verbal cues    Comprehension  Verbalized understanding;Returned demonstration        PT Short Term Goals - 11/11/19 1115      PT SHORT TERM GOAL #1   Title  Patient will be able to perform home program independently for self-management.    Time  4    Period  Weeks    Status  New    Target Date  12/08/19      PT SHORT TERM GOAL #2   Title  Patient will reduce perceived disability to low levels as indicated by <70 on Dizziness Handicap Inventory.    Baseline  scored 78/100 on 11/10/19    Time  4    Period  Weeks    Status  New    Target Date  12/08/19        PT Long Term Goals - 11/11/19 1201      PT LONG TERM GOAL #1   Title  Patient will reduce falls risk as indicated by Activities Specific Balance Confidence Scale (ABC) >67%.    Time  12    Period  Weeks    Status  New    Target Date  02/02/20      PT LONG TERM GOAL #2   Title  Patient will reduce perceived disability to low levels as indicated by <40 on Dizziness Handicap Inventory.    Baseline  scored 78/100 on 11/10/19    Time  12    Period  Weeks    Status  New    Target Date  02/02/20      PT LONG TERM GOAL #3   Title  Patient will report 50% or greater improvement in her symptoms of dizziness and imbalance with provoking motions or positions.    Time  12    Period  Weeks    Status  New    Target Date  02/02/20      PT LONG TERM GOAL #4   Title  Patient will be  able to return to work and be able to walk her dog without imbalance.    Time  12    Period  Weeks    Status  New    Target Date  02/02/20             Plan - 11/11/19 1157    Clinical Impression Statement  Patient presents to the clinic with reports of having dizziness on and off for the past 11 years. Patient with abnormal smooth pursuits, saccades, VOR and VOR cancellation which could be indicators of peripheral and central vestibuar symptoms. 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 Patient scored 3.75% on the ABC scale and 78/100 on DHI indicating falls risk and  severe perception of handicap. Patient would benefit from PT services to address goals and functional deficits as set on plan of care.    Personal Factors and Comorbidities  Comorbidity 2;Time since onset of injury/illness/exacerbation    Comorbidities  HTN, hyperlipidemia    Examination-Activity Limitations  Bend;Lift    Examination-Participation Restrictions  Other   work, talking the dog on walks   Stability/Clinical Decision Making  Evolving/Moderate complexity    Clinical Decision Making  Moderate    Rehab Potential  Good    PT Frequency  1x / week    PT Duration  12 weeks    PT Treatment/Interventions  Canalith Repostioning;Gait training;Stair training;Therapeutic activities;Balance training;Therapeutic exercise;Neuromuscular re-education;Patient/family education;Vestibular    PT Next Visit Plan  review HEP; progress VOR X 1 and add additional exercises to HEP.    PT Home Exercise Plan  feet together with head turns horizontal and vertical; VOR X 1 in sitting 30 second reps    Consulted and Agree with Plan of Care  Patient       Patient will benefit from skilled therapeutic intervention in order to improve the following deficits and impairments:  Decreased balance, Difficulty walking, Dizziness  Visit Diagnosis: Dizziness and giddiness  Unsteadiness on feet     Problem List Patient Active Problem List   Diagnosis Date Noted  . Pure hypercholesterolemia 11/04/2016  . Essential hypertension 09/18/2015  . Hyperlipidemia 09/18/2015  . Recurrent genital herpes simplex 09/18/2015   Lady Deutscher PT, DPT [phone] Lady Deutscher 11/11/2019, 12:12 PM  [facility] [address] [place], [place], [address] Phone: [phone]   Fax:  [phone]  Name: Cheyenne Bean MRN: [id number] Date of Birth: 1956-02-26

## 2019-11-12 ENCOUNTER — Other Ambulatory Visit: Payer: Self-pay

## 2019-11-12 DIAGNOSIS — Z20822 Contact with and (suspected) exposure to covid-19: Secondary | ICD-10-CM

## 2019-11-14 LAB — NOVEL CORONAVIRUS, NAA: SARS-CoV-2, NAA: NOT DETECTED

## 2019-11-22 ENCOUNTER — Ambulatory Visit: Payer: BC Managed Care – PPO

## 2019-11-25 ENCOUNTER — Ambulatory Visit: Payer: BC Managed Care – PPO | Admitting: Physical Therapy

## 2019-11-25 ENCOUNTER — Ambulatory Visit
Admission: RE | Admit: 2019-11-25 | Discharge: 2019-11-25 | Disposition: A | Discharge status: Home / Self Care | Payer: BC Managed Care – PPO | Source: Ambulatory Visit | Attending: Otolaryngology | Admitting: Otolaryngology

## 2019-11-25 ENCOUNTER — Other Ambulatory Visit: Payer: Self-pay

## 2019-11-25 DIAGNOSIS — H90A21 Sensorineural hearing loss, unilateral, right ear, with restricted hearing on the contralateral side: Secondary | ICD-10-CM

## 2019-11-25 DIAGNOSIS — H8302 Labyrinthitis, left ear: Secondary | ICD-10-CM

## 2019-11-25 MED ORDER — GADOBENATE DIMEGLUMINE 529 MG/ML IV SOLN
14.0000 mL | Freq: Once | INTRAVENOUS | Status: AC | PRN
  Administered 2019-11-25: 14 mL via INTRAVENOUS

## 2019-12-02 ENCOUNTER — Encounter: Payer: Self-pay | Admitting: Physical Therapy

## 2019-12-02 ENCOUNTER — Ambulatory Visit: Payer: BC Managed Care – PPO | Attending: Otolaryngology | Admitting: Physical Therapy

## 2019-12-02 ENCOUNTER — Other Ambulatory Visit: Payer: Self-pay

## 2019-12-02 VITALS — BP 106/68

## 2019-12-02 DIAGNOSIS — R2681 Unsteadiness on feet: Secondary | ICD-10-CM | POA: Insufficient documentation

## 2019-12-02 DIAGNOSIS — R42 Dizziness and giddiness: Secondary | ICD-10-CM | POA: Diagnosis present

## 2019-12-02 NOTE — Therapy (Signed)
[facility] [address] [place], [place], [address] Phone: [phone]   Fax:  [phone]  Physical Therapy Treatment  Patient Details  Name: Cheyenne Bean MRN: [id number] Date of Birth: 05-12-1956 Referring Provider (PT): Dr. Ladene Artist   Encounter Date: 12/02/2019  PT End of Session - 12/02/19 0911    Visit Number  2    Number of Visits  13    Date for PT Re-Evaluation  02/02/20    PT Start Time  0904    PT Stop Time  0949    PT Time Calculation (min)  45 min    Equipment Utilized During Treatment  Gait belt    Activity Tolerance  Patient tolerated treatment well    Behavior During Therapy  [facility] for tasks assessed/performed       Past Medical History:  Diagnosis Date  . BRCA negative 01/2017   MyRisk neg  . Family history of breast cancer    1/18 IBIS=10.7%  . Family history of ovarian cancer    MyRisk neg  . Genital herpes   . Hyperlipidemia   . Hypertension   . IBS (irritable bowel syndrome) 02/20/2017  . Sarcoidosis     Past Surgical History:  Procedure Laterality Date  . BACK SURGERY    . BUNIONECTOMY Left   . COLONOSCOPY  2012   cleared for 10 yrs  . COLONOSCOPY WITH PROPOFOL N/A 02/27/2017   Procedure: COLONOSCOPY WITH PROPOFOL;  Surgeon: Lollie Sails, MD;  Location: [facility] ENDOSCOPY;  Service: Endoscopy;  Laterality: N/A;  . VAGINAL HYSTERECTOMY      Vitals:   12/02/19 0910 12/02/19 0942  BP: 107/74 106/68    Subjective Assessment - 12/02/19 0906    Subjective  Patient reports that she is feeling the same. Patient states she has an appointment with a neurologist next Thursday. Patient states she had a brain MRI which was normal per patient report. Patient reports she is getting dizziness every day. Patient reports she did some of her HEp. Patient states she gets dizzy and nausea while doing the VOR x1 exercise.    Pertinent History  Patient reports the dizziness first began about 11 years ago.  Patient reports she told her primary doctor, Dr. Ronnald Ramp about her dizziness symptoms and she was referred to the ENT last year. Patient reports her symptoms come and go. Patient reports her dizziness symptoms are now happening every day. Patient reports when she looks up, turning quickly, bending over, walking and quick movements bring on her symptoms. Patient reports meclizine helps decrease the vertigo, but not the dizziness help decrease her symptoms. Patient reports veering when she walks at times. Patient describes oscillopsia and states that when she blinks her eyes she feels that her eyes do not come back into focus. Patient reports she has had vertigo at times as well. Patient reports that the vertigo has improved and now is not as frequent. Patient reports she had a VNG test last week. 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 Patient has a brain MRI ordered for 12/4. Patient reports she avoids movements because she will get dizziness and nausea. Patient reports she has been out of work since November 1st due to her symptoms. 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 Patient reports that she still does dishes, walks the dog, washes clothes at home, but states she has  problems doing these activities due to her dizziness.    Diagnostic tests  VNG test-revealed L UVH and R sensironeural hearing loss; MRI brain scheduled for Dec 2020    Patient Stated Goals  to be able to return to work, to have decreased dizziness       Neuromuscular Re-education:  VOR x 1 exercise: Patient performed VOR X 1 horizontal in standing 3 reps of 1 minute on firm surface with verbal cues initially for technique as patient was turning head to the right and then coming back to midline and then next rep turning head to the left and coming back to the center. After demonstrated and reinstructed, patient  able to perform VOR X 1 with improved technique.  Patient reports 6/10 dizziness and reports one of the lines of the "X" doubles, reports 5/10 with second rep and 8/10 with third rep. Patient reports mild nausea and required rest break after performing.   Non-compliant/ Firm Surface: On firm surface, patient performed feet together and semi-tandem progressions with alternating lead leg with and without horizontal and vertical head turns with CGA.  Patient reports dizziness with head turns.   Ambulation with head turns:   Patient performed 175' trials of forwards and retro ambulation with horizontal and vertical head turns with CGA.   Patient demonstrates mild veering at times with retro ambulation with vertical head turns more so than horizontal head turns.  Patient reports mild dizziness and imbalance with ambulation with vertical head turns. Patient reports vertical head turns were more challenging then horizontal and that turning around caused dizziness. Patient with mild nausea as well as dizziness.   Ball tracking : Patient performed sitting while holding ball while moving ball horizontally and then vertically while tracking ball with head and eyes with verbal cues to turn the head multiple 1 minute reps.  Added this exercise to HEP   Note: Patient reports intermittent nausea during session that eased with sitting rest breaks. Discussed with patient that she can break up home exercises into smaller time increments spread out throughout the day in order to decrease her nausea.    PT Education - 12/02/19 1318    Education Details  reviewed HEP and added progressions of VOR X 1 in standing and sitting ball follows with horizontal, vertical and circles. discussed plan of care.    Person(s) Educated  Patient    Methods  Explanation;Verbal cues;Demonstration    Comprehension  Verbalized understanding;Returned demonstration       PT Short Term Goals - 11/11/19 1115      PT SHORT TERM GOAL  #1   Title  Patient will be able to perform home program independently for self-management.    Time  4    Period  Weeks    Status  New    Target Date  12/08/19      PT SHORT TERM GOAL #2   Title  Patient will reduce perceived disability to low levels as indicated by <70 on Dizziness Handicap Inventory.    Baseline  scored 78/100 on 11/10/19    Time  4    Period  Weeks    Status  New    Target Date  12/08/19        PT Long Term Goals - 11/11/19 1201      PT LONG TERM GOAL #1   Title  Patient will reduce falls risk as indicated by Activities Specific Balance Confidence Scale (ABC) >67%.    Time  12  Period  Weeks    Status  New    Target Date  02/02/20      PT LONG TERM GOAL #2   Title  Patient will reduce perceived disability to low levels as indicated by <40 on Dizziness Handicap Inventory.    Baseline  scored 78/100 on 11/10/19    Time  12    Period  Weeks    Status  New    Target Date  02/02/20      PT LONG TERM GOAL #3   Title  Patient will report 50% or greater improvement in her symptoms of dizziness and imbalance with provoking motions or positions.    Time  12    Period  Weeks    Status  New    Target Date  02/02/20      PT LONG TERM GOAL #4   Title  Patient will be able to return to work and be able to walk her dog without imbalance.    Time  12    Period  Weeks    Status  New    Target Date  02/02/20            Plan - 12/02/19 1338    Clinical Impression Statement  Patient able to work on progressions of exercises this date and progressed to VOR X 1 in standing and seated ball follows. Patient challenged by amb with head turns especially with vertical head turns as compared to horizontal. Patient reports increase in dizziness with turning and would benefit from practice with quick turns next sessions. Patient encouraged to break up her home exercises in shorter sessions in order to help reduce her nausea. [place] would benefit from continued PT  services to further address goals, to try to reduce patient's symptoms and reduce her falls risk.    Personal Factors and Comorbidities  Comorbidity 2;Time since onset of injury/illness/exacerbation    Comorbidities  HTN, hyperlipidemia    Examination-Activity Limitations  Bend;Lift    Examination-Participation Restrictions  Other   work, talking the dog on walks   Stability/Clinical Decision Making  Evolving/Moderate complexity    Rehab Potential  Good    PT Frequency  1x / week    PT Duration  12 weeks    PT Treatment/Interventions  Canalith Repostioning;Gait training;Stair training;Therapeutic activities;Balance training;Therapeutic exercise;Neuromuscular re-education;Patient/family education;Vestibular    PT Next Visit Plan  review HEP; progress VOR X 1 and add additional exercises to HEP.    PT Home Exercise Plan  feet together with head turns horizontal and vertical; VOR X 1 in sitting 30 second reps    Consulted and Agree with Plan of Care  Patient       Patient will benefit from skilled therapeutic intervention in order to improve the following deficits and impairments:  Decreased balance, Difficulty walking, Dizziness  Visit Diagnosis: Dizziness and giddiness  Unsteadiness on feet     Problem List Patient Active Problem List   Diagnosis Date Noted  . Pure hypercholesterolemia 11/04/2016  . Essential hypertension 09/18/2015  . Hyperlipidemia 09/18/2015  . Recurrent genital herpes simplex 09/18/2015   Lady Deutscher PT, DPT [phone] Lady Deutscher 12/02/2019, 1:45 PM  [facility] [address] [place], [place], [address] Phone: [phone]   Fax:  [phone]  Name: Jowanda Heeg MRN: [id number] Date of Birth: 13-Jun-1956

## 2019-12-07 ENCOUNTER — Ambulatory Visit: Payer: BLUE CROSS/BLUE SHIELD | Admitting: Obstetrics and Gynecology

## 2019-12-09 ENCOUNTER — Encounter: Payer: Self-pay | Admitting: Physical Therapy

## 2019-12-09 ENCOUNTER — Ambulatory Visit: Payer: BC Managed Care – PPO | Admitting: Physical Therapy

## 2019-12-09 ENCOUNTER — Other Ambulatory Visit: Payer: Self-pay

## 2019-12-09 DIAGNOSIS — R42 Dizziness and giddiness: Secondary | ICD-10-CM

## 2019-12-09 DIAGNOSIS — R2681 Unsteadiness on feet: Secondary | ICD-10-CM

## 2019-12-09 NOTE — Therapy (Signed)
[facility] [address] [place], [place], [address] Phone: [phone]   Fax:  [phone]  Physical Therapy Treatment  Patient Details  Name: Cheyenne Bean MRN: [id number] Date of Birth: 21-Nov-1956 Referring Provider (PT): Dr. Ladene Artist   Encounter Date: 12/09/2019  PT End of Session - 12/09/19 0906    Visit Number  3    Number of Visits  13    Date for PT Re-Evaluation  02/02/20    PT Start Time  0902    PT Stop Time  0943    PT Time Calculation (min)  41 min    Equipment Utilized During Treatment  Gait belt    Activity Tolerance  Patient tolerated treatment well    Behavior During Therapy  [facility] for tasks assessed/performed       Past Medical History:  Diagnosis Date  . BRCA negative 01/2017   MyRisk neg  . Family history of breast cancer    1/18 IBIS=10.7%  . Family history of ovarian cancer    MyRisk neg  . Genital herpes   . Hyperlipidemia   . Hypertension   . IBS (irritable bowel syndrome) 02/20/2017  . Sarcoidosis     Past Surgical History:  Procedure Laterality Date  . BACK SURGERY    . BUNIONECTOMY Left   . COLONOSCOPY  2012   cleared for 10 yrs  . COLONOSCOPY WITH PROPOFOL N/A 02/27/2017   Procedure: COLONOSCOPY WITH PROPOFOL;  Surgeon: Lollie Sails, MD;  Location: [facility] ENDOSCOPY;  Service: Endoscopy;  Laterality: N/A;  . VAGINAL HYSTERECTOMY      There were no vitals filed for this visit.  Subjective Assessment - 12/09/19 0904    Subjective  Patient reports she saw a neurologist yesterday and that they ordered a carotid ultrasound and a lower extremity nerve conduction velocity test. Patient reports she has been doing her home exercise. Patient reports yesterday she got very dizzy and whoozy when she was walking her dog. Patient reports the every day dizziness is still there. Patient reports her dizziness is a little better. Patient reports she has been doing her HEP and reports she does get a  little dizzy while doing the exercises.    Pertinent History  Patient reports the dizziness first began about 11 years ago. Patient reports she told her primary doctor, Dr. Ronnald Ramp about her dizziness symptoms and she was referred to the ENT last year. Patient reports her symptoms come and go. Patient reports her dizziness symptoms are now happening every day. Patient reports when she looks up, turning quickly, bending over, walking and quick movements bring on her symptoms. Patient reports meclizine helps decrease the vertigo, but not the dizziness help decrease her symptoms. Patient reports veering when she walks at times. Patient describes oscillopsia and states that when she blinks her eyes she feels that her eyes do not come back into focus. Patient reports she has had vertigo at times as well. Patient reports that the vertigo has improved and now is not as frequent. Patient reports she had a VNG test last week. 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 Patient has a brain MRI ordered for 12/4. Patient reports she avoids movements because she will get dizziness and nausea. Patient reports she has been out of work since November 1st due to her symptoms. 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 Patient  reports that she still does dishes, walks the dog, washes clothes at home, but states she has problems doing these activities due to her dizziness.    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 Patient reports the every day dizziness is still there. Patient reports her dizziness is a little better. Patient reports she has been doing her HEP and  reports she does get a little dizzy while doing the exercises.   Neuromuscular Re-education:  Patient reports 5/10 dizziness upon arrival to clinic.  VOR X 1 exercise:  Patient performed VOR X 1 horizontal in standing with conflicting background 3 reps of 1 minute each. Patient demonstrating improved technique this date.  Patient reports 7/10 dizziness and reports at times she sees a double line on the X target.  Ambulation with head turns:  Patient performed 250' forwards and retro ambulation while scanning for targets in hallway.  Patient reports 4/10 with retro ambulation and noted mild imbalance.  Patient reports she feels it is getting better with backward walking.  Body Wall Rolls:  Patient performed 5 reps of supported, body wall rolls with eyes open.  Patient reports 5/10 dizziness with this activity.    Airex pad:  On Airex pad, patient performed feet together and semi-tandem progressions with alternating lead leg with and without body turns and horizontal and diagonal head turns.  Patient reports 6/10 dizziness with body turns and 4/10 with diagonal head turns.    PT Education - 12/09/19 0906    Education Details  reviewed HEP and added progression of conflicting background for VOR  and semitandem stance with diagonal head turns and body turns and body wall rolls    Person(s) Educated  Patient    Methods  Explanation;Handout;Demonstration;Verbal cues    Comprehension  Verbalized understanding;Returned demonstration       PT Short Term Goals - 11/11/19 1115      PT SHORT TERM GOAL #1   Title  Patient will be able to perform home program independently for self-management.    Time  4    Period  Weeks    Status  New    Target Date  12/08/19      PT SHORT TERM GOAL #2   Title  Patient will reduce perceived disability to low levels as indicated by <70 on Dizziness Handicap Inventory.    Baseline  scored 78/100 on 11/10/19    Time  4    Period  Weeks    Status  New     Target Date  12/08/19        PT Long Term Goals - 11/11/19 1201      PT LONG TERM GOAL #1   Title  Patient will reduce falls risk as indicated by Activities Specific Balance Confidence Scale (ABC) >67%.    Time  12    Period  Weeks    Status  New    Target Date  02/02/20      PT LONG TERM GOAL #2   Title  Patient will reduce perceived disability to low levels as indicated by <40 on Dizziness Handicap Inventory.    Baseline  scored 78/100 on 11/10/19    Time  12    Period  Weeks    Status  New    Target Date  02/02/20      PT LONG TERM GOAL #3   Title  Patient will report 50% or greater improvement in her symptoms of dizziness and imbalance with provoking motions or positions.    Time  12    Period  Weeks    Status  New    Target Date  02/02/20      PT LONG TERM GOAL #4   Title  Patient will be able to return to work and be able to walk her dog without imbalance.    Time  12    Period  Weeks    Status  New    Target Date  02/02/20            Plan - 12/09/19 1139    Clinical Impression Statement  Patient reports compliance with HEP. Patient able to progress to VOR X1 with conflicting background and feet together and semi-tandme stance wtih diagnoal head turns and body turns. Patient challenged by conflicting background with VOR, body wall rolls and head turning activities. Patient demonstrated mild veering with retro ambulation with head turns. Patient demonstrated improved technique with VOR this date. Patient would benefit from PT services to further address deficits and goals as set on plan of care.    Personal Factors and Comorbidities  Comorbidity 2;Time since onset of injury/illness/exacerbation    Comorbidities  HTN, hyperlipidemia    Examination-Activity Limitations  Bend;Lift    Examination-Participation Restrictions  Other   work, talking the dog on walks   Stability/Clinical Decision Making  Evolving/Moderate complexity    Rehab Potential  Good    PT  Frequency  1x / week    PT Duration  12 weeks    PT Treatment/Interventions  Canalith Repostioning;Gait training;Stair training;Therapeutic activities;Balance training;Therapeutic exercise;Neuromuscular re-education;Patient/family education;Vestibular    PT Next Visit Plan  review HEP; progress VOR X 1 and add additional exercises to HEP.    PT Home Exercise Plan  feet together with head turns horizontal and vertical; VOR X 1 in sitting 30 second reps    Consulted and Agree with Plan of Care  Patient       Patient will benefit from skilled therapeutic intervention in order to improve the following deficits and impairments:  Decreased balance, Difficulty walking, Dizziness  Visit Diagnosis: Dizziness and giddiness  Unsteadiness on feet     Problem List Patient Active Problem List   Diagnosis Date Noted  . Pure hypercholesterolemia 11/04/2016  . Essential hypertension 09/18/2015  . Hyperlipidemia 09/18/2015  . Recurrent genital herpes simplex 09/18/2015   Lady Deutscher PT, DPT [phone] Lady Deutscher 12/09/2019, 11:44 AM  [facility] [facility] [address] [place], [place], [address] Phone: [phone]   Fax:  [phone]  Name: Media Pizzini MRN: [id number] Date of Birth: 1956-01-17

## 2019-12-14 ENCOUNTER — Ambulatory Visit: Payer: BC Managed Care – PPO | Admitting: Physical Therapy

## 2019-12-14 ENCOUNTER — Other Ambulatory Visit: Payer: Self-pay

## 2019-12-14 VITALS — BP 116/64

## 2019-12-14 DIAGNOSIS — R42 Dizziness and giddiness: Secondary | ICD-10-CM

## 2019-12-14 DIAGNOSIS — R2681 Unsteadiness on feet: Secondary | ICD-10-CM

## 2019-12-14 NOTE — Therapy (Signed)
[facility] [address] [place], [place], [address] Phone: [phone]   Fax:  [phone]  Physical Therapy Treatment  Patient Details  Name: Cheyenne Bean MRN: [id number] Date of Birth: [birth year] Referring Provider (PT): Dr. Ladene Artist   Encounter Date: 12/14/2019  PT End of Session - 12/14/19 0801    Visit Number  4    Number of Visits  13    Date for PT Re-Evaluation  02/02/20    PT Start Time  0759    PT Stop Time  0827    PT Time Calculation (min)  28 min    Equipment Utilized During Treatment  Gait belt    Activity Tolerance  Other (comment)   increased nausea and dizziness   Behavior During Therapy  [facility] for tasks assessed/performed       Past Medical History:  Diagnosis Date  . BRCA negative 01/2017   MyRisk neg  . Family history of breast cancer    1/18 IBIS=10.7%  . Family history of ovarian cancer    MyRisk neg  . Genital herpes   . Hyperlipidemia   . Hypertension   . IBS (irritable bowel syndrome) 02/20/2017  . Sarcoidosis     Past Surgical History:  Procedure Laterality Date  . BACK SURGERY    . BUNIONECTOMY Left   . COLONOSCOPY  2012   cleared for 10 yrs  . COLONOSCOPY WITH PROPOFOL N/A 02/27/2017   Procedure: COLONOSCOPY WITH PROPOFOL;  Surgeon: Lollie Sails, MD;  Location: [facility] ENDOSCOPY;  Service: Endoscopy;  Laterality: N/A;  . VAGINAL HYSTERECTOMY      Vitals:   12/14/19 0839 12/14/19 0840  BP: (!) 96/59 116/64    Subjective Assessment - 12/14/19 0759    Subjective  Patient reports she has been taking Meclizine every day. Patient reports she went to bed and woke up with strong dizziness and nausea. Patient denies vertigo. Patient reports she did not do any activity that might have brought on the dizziness yesterday. Patient reports she was at home yesterday and did the dishes but not any strenuous activity.    Pertinent History  Patient reports the dizziness first began about 11  years ago. Patient reports she told her primary doctor, Dr. Ronnald Ramp about her dizziness symptoms and she was referred to the ENT last year. Patient reports her symptoms come and go. Patient reports her dizziness symptoms are now happening every day. Patient reports when she looks up, turning quickly, bending over, walking and quick movements bring on her symptoms. Patient reports meclizine helps decrease the vertigo, but not the dizziness help decrease her symptoms. Patient reports veering when she walks at times. Patient describes oscillopsia and states that when she blinks her eyes she feels that her eyes do not come back into focus. Patient reports she has had vertigo at times as well. Patient reports that the vertigo has improved and now is not as frequent. Patient reports she had a VNG test last week. 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 Patient has a brain MRI ordered for 12/4. Patient reports she avoids movements because she will get dizziness and nausea. Patient reports she has been out of work since November 1st due to her symptoms. 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 Patient reports that she still does dishes, walks the dog, washes clothes at home, but states she  has problems doing these activities due to her dizziness.    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  Patient considering going to her eye doctor because she states she has noticed a change in her vision.  Patient reports a 8/10 dizziness upon arrival and states she has nausea as well. Patient states she almost called to cancel this morning due to her symptoms.  Patient  states she has been checking her blood pressure twice a day at home and that it has been in the low 100s over 60s in the mornings.  Neuromuscular Re-education:  VOR X 1 exercise:  Patient performed VOR X 1 horizontal in standing with conflicting background 2 reps of 1 minute horizontal and 1 rep of 1 minute vertical. Patient demonstrates good technique this date.  Patient reports 8/10 dizziness with this activity. Patient reports horizontal head turns are more difficult than the vertical head turns.   Body Wall Rolls:  Patient performed 4 reps of supported, body wall rolls 1 rep with eyes open and 3 reps with eyes closed with CGA.  Patient reports 10/10 dizziness with this activity and increased nausea.   Ambulation with head turns:  Patient performed 175' trials of forwards ambulation with diagonal head turns with CGA. Patient reports this did not increase her symptoms because she states she could perform the head turns more slowly.  Attempted retro ambulation with diagonal head turns but patient had to stop due to increased nausea and dizziness.  Patient requested to end the session early due to increased nausea and dizziness today.   PT Education - 12/14/19 0844    Education Details  discussed HEP    Person(s) Educated  Patient    Methods  Explanation    Comprehension  Verbalized understanding       PT Short Term Goals - 11/11/19 1115      PT SHORT TERM GOAL #1   Title  Patient will be able to perform home program independently for self-management.    Time  4    Period  Weeks    Status  New    Target Date  12/08/19      PT SHORT TERM GOAL #2   Title  Patient will reduce perceived disability to low levels as indicated by <70 on Dizziness Handicap Inventory.    Baseline  scored 78/100 on 11/10/19    Time  4    Period  Weeks    Status  New    Target Date  12/08/19        PT Long Term Goals - 11/11/19 1201      PT LONG TERM GOAL #1   Title  Patient will reduce falls  risk as indicated by Activities Specific Balance Confidence Scale (ABC) >67%.    Time  12    Period  Weeks    Status  New    Target Date  02/02/20      PT LONG TERM GOAL #2   Title  Patient will reduce perceived disability to low levels as indicated by <40 on Dizziness Handicap Inventory.    Baseline  scored 78/100 on 11/10/19    Time  12    Period  Weeks  Status  New    Target Date  02/02/20      PT LONG TERM GOAL #3   Title  Patient will report 50% or greater improvement in her symptoms of dizziness and imbalance with provoking motions or positions.    Time  12    Period  Weeks    Status  New    Target Date  02/02/20      PT LONG TERM GOAL #4   Title  Patient will be able to return to work and be able to walk her dog without imbalance.    Time  12    Period  Weeks    Status  New    Target Date  02/02/20            Plan - 12/14/19 0845    Clinical Impression Statement  Patient arrives to clinic with reports of increased nausea and dizziness that began last evening and states she did not have any increase or changes in her activity level yesterday. Patient demonstrated improved technique with VOR X 1 this date and reports compliance with home exercise program. Patient had difficulty tolerating exercises this date and requested to end session early due to increased dizziness and nausea. Encouraged patient to continue with current home exercise program per her tolerance and to return to clinic next week. Patient would benefit from continued PT services to further address deficits and goals.    Personal Factors and Comorbidities  Comorbidity 2;Time since onset of injury/illness/exacerbation    Comorbidities  HTN, hyperlipidemia    Examination-Activity Limitations  Bend;Lift    Examination-Participation Restrictions  Other   work, talking the dog on walks   Stability/Clinical Decision Making  Evolving/Moderate complexity    Rehab Potential  Good    PT Frequency  1x / week     PT Duration  12 weeks    PT Treatment/Interventions  Canalith Repostioning;Gait training;Stair training;Therapeutic activities;Balance training;Therapeutic exercise;Neuromuscular re-education;Patient/family education;Vestibular    PT Next Visit Plan  review HEP; progress VOR X 1 and add additional exercises to HEP.    PT Home Exercise Plan  feet together with head turns horizontal and vertical; VOR X 1 in sitting 30 second reps    Consulted and Agree with Plan of Care  Patient       Patient will benefit from skilled therapeutic intervention in order to improve the following deficits and impairments:  Decreased balance, Difficulty walking, Dizziness  Visit Diagnosis: Dizziness and giddiness  Unsteadiness on feet     Problem List Patient Active Problem List   Diagnosis Date Noted  . Pure hypercholesterolemia 11/04/2016  . Essential hypertension 09/18/2015  . Hyperlipidemia 09/18/2015  . Recurrent genital herpes simplex 09/18/2015    Lady Deutscher 12/14/2019, 8:48 AM  [place] [address] [place], [place], [address] Phone: [phone]   Fax:  [phone]  Name: Cheyenne Bean MRN: [id number] Date of Birth: 1956-11-16

## 2019-12-21 ENCOUNTER — Encounter: Payer: Self-pay | Admitting: Obstetrics and Gynecology

## 2019-12-21 ENCOUNTER — Other Ambulatory Visit: Payer: Self-pay

## 2019-12-21 ENCOUNTER — Ambulatory Visit (INDEPENDENT_AMBULATORY_CARE_PROVIDER_SITE_OTHER): Payer: BC Managed Care – PPO | Admitting: Obstetrics and Gynecology

## 2019-12-21 VITALS — BP 141/87 | HR 86 | Wt 162.0 lb

## 2019-12-21 DIAGNOSIS — B373 Candidiasis of vulva and vagina: Secondary | ICD-10-CM

## 2019-12-21 DIAGNOSIS — B3731 Acute candidiasis of vulva and vagina: Secondary | ICD-10-CM

## 2019-12-21 DIAGNOSIS — Z01419 Encounter for gynecological examination (general) (routine) without abnormal findings: Secondary | ICD-10-CM | POA: Diagnosis not present

## 2019-12-21 DIAGNOSIS — Z1239 Encounter for other screening for malignant neoplasm of breast: Secondary | ICD-10-CM

## 2019-12-21 MED ORDER — FLUCONAZOLE 150 MG PO TABS: 150.0000 mg | ORAL_TABLET | Freq: Once | ORAL | 0 refills | Status: AC

## 2019-12-21 NOTE — Patient Instructions (Signed)
[facility] [address] [place] [address]  [facility] [place]  [address] [place] [address]  Phone: [phone]  

## 2019-12-21 NOTE — Progress Notes (Signed)
Gynecology Annual Exam  PCP: Juline Patch, MD  Chief Complaint:  Chief Complaint  Patient presents with  . Gynecologic Exam  . Breast Pain    left breast feels uncomfortable    History of Present Illness:Patient is a 63 y.o. G1P0010 presents for annual exam. The patient has no complaints today.   LMP: No LMP recorded. Patient has had a hysterectomy.  Does report some vaginal itching and irritation.  The patient does perform self breast exams.  There is no notable family history of breast or ovarian cancer in her family.  The patient wears seatbelts: yes.   The patient has regular exercise: not asked.    The patient denies current symptoms of depression.     Review of Systems: Review of Systems  Constitutional: Negative for chills and fever.  HENT: Negative for congestion.   Respiratory: Negative for cough and shortness of breath.   Cardiovascular: Negative for chest pain and palpitations.  Gastrointestinal: Negative for abdominal pain, constipation, diarrhea, heartburn, nausea and vomiting.  Genitourinary: Negative for dysuria, frequency and urgency.  Skin: Negative for itching and rash.  Neurological: Negative for dizziness and headaches.  Endo/Heme/Allergies: Negative for polydipsia.  Psychiatric/Behavioral: Negative for depression.    Past Medical History:  Past Medical History:  Diagnosis Date  . BRCA negative 01/2017   MyRisk neg  . Family history of breast cancer    1/18 IBIS=10.7%  . Family history of ovarian cancer    MyRisk neg  . Genital herpes   . Hyperlipidemia   . Hypertension   . IBS (irritable bowel syndrome) 02/20/2017  . Sarcoidosis     Past Surgical History:  Past Surgical History:  Procedure Laterality Date  . BACK SURGERY    . BUNIONECTOMY Left   . COLONOSCOPY  2012   cleared for 10 yrs  . COLONOSCOPY WITH PROPOFOL N/A 02/27/2017   Procedure: COLONOSCOPY WITH PROPOFOL;  Surgeon: Lollie Sails, MD;  Location: [facility] END[facility]COPY;   Service: Endoscopy;  Laterality: N/A;  . VAGINAL HYSTERECTOMY      Gynecologic History:  No LMP recorded. Patient has had a hysterectomy. Last Pap: Results were: N/A prior hysterectomy Last mammogram: 07/04/2019 Results were: BI-RAD I  Obstetric History: G1P0010  Family History:  Family History  Problem Relation Age of Onset  . Hypertension Mother   . Breast cancer Sister 70  . Cervical cancer Sister 71  . Ovarian cancer Maternal Aunt 60  . Colon cancer Maternal Uncle 6    Social History:  Social History   Socioeconomic History  . Marital status: Married    Spouse name: Not on file  . Number of children: Not on file  . Years of education: Not on file  . Highest education level: Not on file  Occupational History  . Not on file  Tobacco Use  . Smoking status: Never Smoker  . Smokeless tobacco: Never Used  Substance and Sexual Activity  . Alcohol use: No    Alcohol/week: 0.0 standard drinks  . Drug use: No  . Sexual activity: Yes  Other Topics Concern  . Not on file  Social History Narrative  . Not on file   Social Determinants of Health   Financial Resource Strain:   . Difficulty of Paying Living Expenses: Not on file  Food Insecurity:   . Worried About Charity fundraiser in the Last Year: Not on file  . Ran Out of Food in the Last Year: Not on file  Transportation Needs:   . Film/video editor (Medical): Not on file  . Lack of Transportation (Non-Medical): Not on file  Physical Activity:   . Days of Exercise per Week: Not on file  . Minutes of Exercise per Session: Not on file  Stress:   . Feeling of Stress : Not on file  Social Connections:   . Frequency of Communication with Friends and Family: Not on file  . Frequency of Social Gatherings with Friends and Family: Not on file  . Attends Religious Services: Not on file  . Active Member of Clubs or Organizations: Not on file  . Attends Archivist Meetings: Not on file  . Marital Status:  Not on file  Intimate Partner Violence:   . Fear of Current or Ex-Partner: Not on file  . Emotionally Abused: Not on file  . Physically Abused: Not on file  . Sexually Abused: Not on file    Allergies:  No Known Allergies  Medications: Prior to Admission medications   Medication Sig Start Date End Date Taking? Authorizing Provider  diclofenac sodium (VOLTAREN) 1 % GEL Apply 2 g topically 4 (four) times daily. 08/05/19  Yes Juline Patch, MD  lisinopril-hydrochlorothiazide (ZESTORETIC) 20-25 MG tablet Take 1 tablet by mouth daily. 08/05/19  Yes Juline Patch, MD  meclizine (ANTIVERT) 25 MG tablet Take 1 tablet (25 mg total) by mouth 3 (three) times daily as needed for dizziness. 09/08/18  Yes Juline Patch, MD  pravastatin (PRAVACHOL) 20 MG tablet Take 1 tablet (20 mg total) by mouth daily. 08/05/19  Yes Juline Patch, MD  sertraline (ZOLOFT) 50 MG tablet Take 1 tablet (50 mg total) by mouth daily. 08/05/19  Yes Juline Patch, MD  Turmeric 500 MG CAPS Take by mouth.   Yes [provider]  valACYclovir (VALTREX) 500 MG tablet TAKE 1 TABLET BY MOUTH ONCE DAILY SCHEDULE AN APPOINTMENT 08/05/19  Yes Juline Patch, MD    Physical Exam Vitals: Blood pressure (!) 141/87, pulse 86, weight 162 lb (73.5 kg).  General: NAD HEENT: normocephalic, anicteric Thyroid: no enlargement, no palpable nodules Pulmonary: No increased work of breathing, CTAB Cardiovascular: RRR, distal pulses 2+ Breast: Breast symmetrical, no tenderness, no palpable nodules or masses, no skin or nipple retraction present, no nipple discharge.  No axillary or supraclavicular lymphadenopathy. Abdomen: NABS, soft, non-tender, non-distended.  Umbilicus without lesions.  No hepatomegaly, splenomegaly or masses palpable. No evidence of hernia  Genitourinary:  External: Normal external female genitalia.  Normal urethral meatus, normal Bartholin's and Skene's glands.    Vagina: Normal vaginal mucosa, no evidence of  prolapse.  Thick white discharge consistent with vaginal candida noted  Cervix: surgically absent  Uterus: surgically absent  Adnexa: ovaries non-enlarged, no adnexal masses  Rectal: deferred  Lymphatic: no evidence of inguinal lymphadenopathy Extremities: no edema, erythema, or tenderness Neurologic: Grossly intact Psychiatric: mood appropriate, affect full  Female chaperone present for pelvic and breast  portions of the physical exam     Assessment: 63 y.o. G1P0010 routine annual exam  Plan: Problem List Items Addressed This Visit    None    Visit Diagnoses    Encounter for gynecological examination without abnormal finding    -  Primary   Breast screening       Relevant Orders   MM 3D SCREEN BREAST BILATERAL   Vaginal candida       Relevant Medications   fluconazole (DIFLUCAN) 150 MG tablet      1)  Mammogram - recommend yearly screening mammogram.  Mammogram Is up to date  2) STI screening  was notoffered and therefore not obtained  3) ASCCP guidelines and rational discussed.  Patient opts for discontinue secondary to prior hysterectomy screening interval  4) Osteoporosis  - per USPTF routine screening DEXA at age 80  5) Routine healthcare maintenance including cholesterol, diabetes screening discussed managed by PCP  6) Vaginal candida symptoms- Rx diflucan  7) Return in about 1 year (around 12/20/2020) for annual.    Malachy Mood, MD Mosetta Pigeon, [facility] 12/21/2019, 4:20 PM

## 2019-12-22 ENCOUNTER — Ambulatory Visit: Payer: BC Managed Care – PPO

## 2019-12-30 ENCOUNTER — Ambulatory Visit: Payer: BC Managed Care – PPO

## 2020-01-26 ENCOUNTER — Other Ambulatory Visit: Payer: Self-pay | Admitting: Acute Care

## 2020-01-26 DIAGNOSIS — R42 Dizziness and giddiness: Secondary | ICD-10-CM

## 2020-01-26 DIAGNOSIS — H9313 Tinnitus, bilateral: Secondary | ICD-10-CM

## 2020-01-26 DIAGNOSIS — R270 Ataxia, unspecified: Secondary | ICD-10-CM

## 2020-01-26 DIAGNOSIS — I729 Aneurysm of unspecified site: Secondary | ICD-10-CM

## 2020-01-31 ENCOUNTER — Other Ambulatory Visit: Payer: Self-pay

## 2020-01-31 NOTE — Progress Notes (Unsigned)
Added in 25mg  sertraline to = 75mg  per pharmacy/ made change on med. Requested that they send her both 50 and 25 Rxs

## 2020-02-11 ENCOUNTER — Ambulatory Visit
Admission: RE | Admit: 2020-02-11 | Discharge: 2020-02-11 | Disposition: A | Discharge status: Home / Self Care | Payer: BC Managed Care – PPO | Source: Ambulatory Visit | Attending: Acute Care | Admitting: Acute Care

## 2020-02-11 DIAGNOSIS — H9313 Tinnitus, bilateral: Secondary | ICD-10-CM

## 2020-02-11 DIAGNOSIS — R42 Dizziness and giddiness: Secondary | ICD-10-CM

## 2020-02-11 DIAGNOSIS — I729 Aneurysm of unspecified site: Secondary | ICD-10-CM

## 2020-02-11 DIAGNOSIS — R270 Ataxia, unspecified: Secondary | ICD-10-CM

## 2020-02-17 ENCOUNTER — Ambulatory Visit: Payer: BC Managed Care – PPO | Attending: Otolaryngology | Admitting: Physical Therapy

## 2020-02-17 ENCOUNTER — Other Ambulatory Visit: Payer: Self-pay

## 2020-02-17 ENCOUNTER — Encounter: Payer: Self-pay | Admitting: Physical Therapy

## 2020-02-17 DIAGNOSIS — R42 Dizziness and giddiness: Secondary | ICD-10-CM | POA: Insufficient documentation

## 2020-02-17 DIAGNOSIS — R2681 Unsteadiness on feet: Secondary | ICD-10-CM | POA: Diagnosis present

## 2020-02-17 NOTE — Therapy (Signed)
[place] [facility] [address]. [place], [place], [address] Phone: [phone]   Fax:  [phone]  Physical Therapy Treatment/Recertification  Dates of Services: 11/11/2019-present Number of visits: 5  Patient Details  Name: Cheyenne Bean MRN: [id number] Date of Birth: [birth year] Referring Provider (PT): Dr. Ladene Artist   Encounter Date: 02/17/2020     PT End of Session - 02/20/20 1542    Visit Number  5    Number of Visits  13    Date for PT Re-Evaluation  04/13/20    PT Start Time  0812    PT Stop Time  0858    PT Time Calculation (min)  46 min    Equipment Utilized During Treatment  Gait belt    Activity Tolerance  Patient tolerated treatment well   increased nausea and dizziness   Behavior During Therapy  [facility] for tasks assessed/performed        Past Medical History:  Diagnosis Date  . BRCA negative 01/2017   MyRisk neg  . Family history of breast cancer    1/18 IBIS=10.7%  . Family history of ovarian cancer    MyRisk neg  . Genital herpes   . Hyperlipidemia   . Hypertension   . IBS (irritable bowel syndrome) 02/20/2017  . Sarcoidosis     Past Surgical History:  Procedure Laterality Date  . BACK SURGERY    . BUNIONECTOMY Left   . COLONOSCOPY  2012   cleared for 10 yrs  . COLONOSCOPY WITH PROPOFOL N/A 02/27/2017   Procedure: COLONOSCOPY WITH PROPOFOL;  Surgeon: Lollie Sails, MD;  Location: [facility] ENDOSCOPY;  Service: Endoscopy;  Laterality: N/A;  . VAGINAL HYSTERECTOMY     Subjective Assessment - 02/20/20 1540    Subjective  Patient reports that she has returned to therapy because her dizziness has gotten worse. Patient states that she has not been doing her vestibular HEP at home.    Pertinent History  Patient reports the dizziness first began about 11 years ago. Patient reports she told her primary doctor, Dr. Ronnald Ramp about her dizziness symptoms and she was referred to the ENT last year. Patient reports her  symptoms come and go. Patient reports her dizziness symptoms are now happening every day. Patient reports when she looks up, turning quickly, bending over, walking and quick movements bring on her symptoms. Patient reports meclizine helps decrease the vertigo, but not the dizziness help decrease her symptoms. Patient reports veering when she walks at times. Patient describes oscillopsia and states that when she blinks her eyes she feels that her eyes do not come back into focus. Patient reports she has had vertigo at times as well. Patient reports that the vertigo has improved and now is not as frequent. Patient reports she had a VNG test last week. 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 Patient has a brain MRI ordered for 12/4. Patient reports she avoids movements because she will get dizziness and nausea. Patient reports she has been out of work since November 1st due to her symptoms. 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 Patient reports that she still does dishes, walks the dog, washes clothes at home, but states she has problems doing these activities due to her dizziness.    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   Neuromuscular Re-education:  Patient was seen in the clinic from 11/11/2019 to last seen on 12/14/2019 for a total of 4 visits. Patient returns to the clinic requesting to resume PT services as she reports her symptoms have worsened. Patient reports that she has not been performing her home exercise program. Patient reports she had an epidural shot about a week and half ago for her back pain. Patient states she feels a sharp pain in her left hip since the  shot. Patient states she felt good relief of her back pain for the first two days after the shot. Patient states she had hurt her back at work 10 years ago and had back surgery and states she has had a reoccurrence twice. Patient reports the dizziness has gotten worse since last being seen in the clinic in December. Patient states she was walking her dog at night and she fell falling onto her right side in the grass about 3 weeks. Patient reports 1 fall in the past six months. Patient states she has mostly stayed at home and states she avoids crowds. Patient states she has been having dizziness every day and states she has had vertigo a few times. Patient states it is the every day dizziness that bothers her the most and states it never goes away and states the every day dizziness is always constant no matter her activity level and nothing makes it better or worse. Patient states she had a brain MRI and states it was normal. Patient reports when she did have vertigo it lasted about 3 days at a time. Patient reports no activity that she is aware of that brings on her vertigo. Patient reports that she has been getting intermittent nausea as well.   Dix-Hallpike: Performed left and right Dix-Hallpike tests and both were negative with patient denying vertigo and no nystagmus observed. Performed horizontal canal testing and patient denied vertigo and no nystagmus was noted with right canal testing and patient reported 5/10 vertigo with left canal testing but no nystagmus was observed in room light. Will plan on repeating left horizontal canal test and consider performing treatment maneuver if indicated.  VOR X 1 exercise:  Patient performed VOR X 1 horizontal in standing 3 reps of 30 seconds each. Patient reports 6/10 dizziness at rest and 7/10 dizziness after first rep and 6/10 dizziness after last rep.  Re-issued VOR X1 for HEP with handout provided.   Ambulation with head turns:  Patient performed 120'  trials of forwards ambulation with horizontal and vertical head turns with CGA.   Patient demonstrates mild veering with vertical and horizontal head turns.   Airex pad:  On Airex pad, patient performed normal base of support and then with feet about 2" apart and then semi-tandem progressions with alternating lead leg with and without horizontal and vertical head turns.  Patient reports increase in dizziness with horizontal head turns.            PT Short Term Goals - 02/20/20 1518      PT SHORT TERM GOAL #1   Title  Patient will be able to perform home program independently for self-management.    Time  4    Period  Weeks    Status  On-going    Target Date  03/16/20      PT  SHORT TERM GOAL #2   Title  Patient will reduce perceived disability to low levels as indicated by <70 on Dizziness Handicap Inventory.    Baseline  scored 78/100 on 11/10/19; scored 84/100 on 02/17/2020    Time  4    Period  Weeks    Status  On-going    Target Date  03/16/20        PT Long Term Goals - 02/20/20 1520      PT LONG TERM GOAL #1   Title  Patient will reduce falls risk as indicated by Activities Specific Balance Confidence Scale (ABC) >67%.    Baseline  scored 5% on 02/17/2020    Time  12    Period  Weeks    Status  On-going    Target Date  04/13/20      PT LONG TERM GOAL #2   Title  Patient will reduce perceived disability to low levels as indicated by <40 on Dizziness Handicap Inventory.    Baseline  scored 78/100 on 11/10/19; scored 84/100 on 02/17/2020    Time  12    Period  Weeks    Status  On-going    Target Date  04/13/20      PT LONG TERM GOAL #3   Title  Patient will report 50% or greater improvement in her symptoms of dizziness and imbalance with provoking motions or positions.    Time  12    Period  Weeks    Status  On-going    Target Date  04/13/20      PT LONG TERM GOAL #4   Title  Patient will be able to return to work and be able to walk her dog without imbalance.     Time  12    Period  Weeks    Status  On-going    Target Date  04/13/20         Plan - 02/20/20 1517    Clinical Impression Statement  Patient was seen in the clinic from 11/11/2019 to last seen on 12/14/2019 for a total of 4 visits. Patient returns to the clinic requesting to resume PT services as she reports her symptoms have worsened. Patient reports that she has not been performing her home exercise program. Discussed importance of HEP and reissued VOR X 1. Patient iwth negative Dix-Hallpike tests and right horizontal canal test. Patient did report vertigo with left horizontal canal test, however, did not observe nystagmus in room light. Patient scored 84/100 indicating severe perception of handicap on the [place] and 5% on the ABC scale indicating high falls risk. Patient would benefit from continued PT services to address goals and functional deficits.    Personal Factors and Comorbidities  Comorbidity 2;Time since onset of injury/illness/exacerbation    Comorbidities  HTN, hyperlipidemia    Examination-Activity Limitations  Bend;Lift    Examination-Participation Restrictions  Other   work, talking the dog on walks   Stability/Clinical Decision Making  Evolving/Moderate complexity    Rehab Potential  Good    PT Frequency  1x / week    PT Duration  12 weeks    PT Treatment/Interventions  Canalith Repostioning;Gait training;Stair training;Therapeutic activities;Balance training;Therapeutic exercise;Neuromuscular re-education;Patient/family education;Vestibular    PT Next Visit Plan  review HEP; progress VOR X 1 and add additional exercises to HEP.    PT Home Exercise Plan  feet together with head turns horizontal and vertical; VOR X 1 in sitting 30 second reps    Consulted and Agree with  Plan of Care  Patient         Patient will benefit from skilled therapeutic intervention in order to improve the following deficits and impairments:     Visit Diagnosis: Dizziness and  giddiness  Unsteadiness on feet     Problem List Patient Active Problem List   Diagnosis Date Noted  . Pure hypercholesterolemia 11/04/2016  . Essential hypertension 09/18/2015  . Hyperlipidemia 09/18/2015  . Recurrent genital herpes simplex 09/18/2015   Lady Deutscher PT, DPT [id number] Lady Deutscher 02/17/2020, 8:14 AM  [place] [facility] [address] [place], [place], [address] Phone: [phone]   Fax:  [phone]  Name: Cheyenne Bean MRN: [id number] Date of Birth: [birth year]

## 2020-02-24 ENCOUNTER — Encounter: Payer: Self-pay | Admitting: Physical Therapy

## 2020-02-24 ENCOUNTER — Other Ambulatory Visit: Payer: Self-pay

## 2020-02-24 ENCOUNTER — Ambulatory Visit: Payer: BC Managed Care – PPO | Attending: Otolaryngology | Admitting: Physical Therapy

## 2020-02-24 DIAGNOSIS — M545 Low back pain: Secondary | ICD-10-CM | POA: Insufficient documentation

## 2020-02-24 DIAGNOSIS — R42 Dizziness and giddiness: Secondary | ICD-10-CM | POA: Insufficient documentation

## 2020-02-24 DIAGNOSIS — G8929 Other chronic pain: Secondary | ICD-10-CM | POA: Insufficient documentation

## 2020-02-24 DIAGNOSIS — R2681 Unsteadiness on feet: Secondary | ICD-10-CM

## 2020-02-24 NOTE — Therapy (Signed)
[place] [facility] [address] [place], [place], [address] Phone: [phone]   Fax:  [phone]  Physical Therapy Treatment  Patient Details  Name: Cheyenne Bean MRN: [id number] Date of Birth: 10/03/1956 Referring Provider (PT): Dr. Ladene Artist   Encounter Date: 02/24/2020  PT End of Session - 02/24/20 1022    Visit Number  6    Number of Visits  13    Date for PT Re-Evaluation  04/13/20    PT Start Time  0813    PT Stop Time  0906    PT Time Calculation (min)  53 min    Equipment Utilized During Treatment  Gait belt    Activity Tolerance  Patient tolerated treatment well    Behavior During Therapy  [facility] for tasks assessed/performed       Past Medical History:  Diagnosis Date  . BRCA negative 01/2017   MyRisk neg  . Family history of breast cancer    1/18 IBIS=10.7%  . Family history of ovarian cancer    MyRisk neg  . Genital herpes   . Hyperlipidemia   . Hypertension   . IBS (irritable bowel syndrome) 02/20/2017  . Sarcoidosis     Past Surgical History:  Procedure Laterality Date  . BACK SURGERY    . BUNIONECTOMY Left   . COLONOSCOPY  2012   cleared for 10 yrs  . COLONOSCOPY WITH PROPOFOL N/A 02/27/2017   Procedure: COLONOSCOPY WITH PROPOFOL;  Surgeon: Lollie Sails, MD;  Location: [facility] ENDOSCOPY;  Service: Endoscopy;  Laterality: N/A;  . VAGINAL HYSTERECTOMY      There were no vitals filed for this visit.  Subjective Assessment - 02/24/20 0814    Subjective  Patient reports that she woke up feeling badly today. Patient states she is nauseous and dizzy today. Patient states she did not do anything differently this past week. Patient states she did laundry yesterday but had to have someone walk her dog.    Pertinent History  Patient reports the dizziness first began about 11 years ago. Patient reports she told her primary doctor, Dr. Ronnald Ramp about her dizziness symptoms and she was referred to the ENT last year.  Patient reports her symptoms come and go. Patient reports her dizziness symptoms are now happening every day. Patient reports when she looks up, turning quickly, bending over, walking and quick movements bring on her symptoms. Patient reports meclizine helps decrease the vertigo, but not the dizziness help decrease her symptoms. Patient reports veering when she walks at times. Patient describes oscillopsia and states that when she blinks her eyes she feels that her eyes do not come back into focus. Patient reports she has had vertigo at times as well. Patient reports that the vertigo has improved and now is not as frequent. Patient reports she had a VNG test last week. 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 Patient has a brain MRI ordered for 12/4. Patient reports she avoids movements because she will get dizziness and nausea. Patient reports she has been out of work since November 1st due to her symptoms. 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 Patient reports that she still does dishes, walks the dog, washes clothes at home, but states she has problems doing these activities due to her dizziness.    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 Patient states that she did the VOR X 1 exercise in sitting this past week. Patient states she went to see her orthopedic doctor and has a follow-up appointment on April 1st. Patient eports the epidural injection that she had done a few months ago helped for a few days, but then she states she got a low back pain that has persisted and has tingling down to her foot on the left leg.   Neuromuscular  Re-education:  VOR x 1 exercise: Patient performed VOR X 1 horizontal in standing 3 reps of 1 minute demonstrating good technique. Patient reports that she sees a purple spot behind the sticky note on the wall and that when she turns her head to the left the targets appears to double/shift. Patient reports dizziness symptoms with this exercise.  Ambulation with head turns:  Patient performed 175' trials of forwards and retro ambulation with horizontal and vertical head turns with CGA.  Patient demonstrates mild trunk sway/imbalance and ambulates with slow cadence. Patient reports 9/10 dizziness with this activity.  Patient required a few short standing rest breaks during this activity.  Body Wall Rolls:  Patient performed 6 reps of supported, body wall rolls with eyes open.  Patient reports 7/10 dizziness with this activity.    PT Education - 02/24/20 1035    Education Details  reviewed VOR and added progression of standing 1 minute reps and added body wall rolls for HEP    Person(s) Educated  Patient    Methods  Explanation;Demonstration;Verbal cues;Handout    Comprehension  Verbalized understanding;Returned demonstration       PT Short Term Goals - 02/20/20 1518      PT SHORT TERM GOAL #1   Title  Patient will be able to perform home program independently for self-management.    Time  4    Period  Weeks    Status  On-going    Target Date  03/16/20      PT SHORT TERM GOAL #2   Title  Patient will reduce perceived disability to low levels as indicated by <70 on Dizziness Handicap Inventory.    Baseline  scored 78/100 on 11/10/19; scored 84/100 on 02/17/2020    Time  4    Period  Weeks    Status  On-going    Target Date  03/16/20        PT Long Term Goals - 02/20/20 1520      PT LONG TERM GOAL #1   Title  Patient will reduce falls risk as indicated by Activities Specific Balance Confidence Scale (ABC) >67%.    Baseline  scored 5% on 02/17/2020    Time  12    Period   Weeks    Status  On-going    Target Date  04/13/20      PT LONG TERM GOAL #2   Title  Patient will reduce perceived disability to low levels as indicated by <40 on Dizziness Handicap Inventory.    Baseline  scored 78/100 on 11/10/19; scored 84/100 on 02/17/2020    Time  12    Period  Weeks    Status  On-going    Target Date  04/13/20      PT LONG TERM GOAL #3   Title    Patient will report 50% or greater improvement in her symptoms of dizziness and imbalance with provoking motions or positions.    Time  12    Period  Weeks    Status  On-going    Target Date  04/13/20      PT LONG TERM GOAL #4   Title  Patient will be able to return to work and be able to walk her dog without imbalance.    Time  12    Period  Weeks    Status  On-going    Target Date  04/13/20            Plan - 02/24/20 1039    Clinical Impression Statement  Patient reports compliance with performing the VOR X 1 exercise this past week. Patient arrived to clinic reporting 8/10 dizziness and at the end of the session, patient reported 7/10 dizziness. Patient was challenged by ambulation with head turns and demonstrated mild trunk sway imbalance and performed with decreased cadence. Patient issued additional exercise of body wall rolls and progression of VOR X 1 1 minute reps in standing this date. Patient would benefit from continued PT services to further address functional deficits and goals as set on plan of care.    Personal Factors and Comorbidities  Comorbidity 2;Time since onset of injury/illness/exacerbation    Comorbidities  HTN, hyperlipidemia    Examination-Activity Limitations  Bend;Lift    Examination-Participation Restrictions  Other   work, talking the dog on walks   Stability/Clinical Decision Making  Evolving/Moderate complexity    Rehab Potential  Good    PT Frequency  1x / week    PT Duration  12 weeks    PT Treatment/Interventions  Canalith Repostioning;Gait training;Stair  training;Therapeutic activities;Balance training;Therapeutic exercise;Neuromuscular re-education;Patient/family education;Vestibular    PT Next Visit Plan  review HEP; progress VOR X 1 and add additional exercises to HEP.    PT Home Exercise Plan  feet together with head turns horizontal and vertical; VOR X 1 in sitting 30 second reps    Consulted and Agree with Plan of Care  Patient       Patient will benefit from skilled therapeutic intervention in order to improve the following deficits and impairments:  Decreased balance, Difficulty walking, Dizziness  Visit Diagnosis: Dizziness and giddiness  Unsteadiness on feet     Problem List Patient Active Problem List   Diagnosis Date Noted  . Pure hypercholesterolemia 11/04/2016  . Essential hypertension 09/18/2015  . Hyperlipidemia 09/18/2015  . Recurrent genital herpes simplex 09/18/2015   Dorriea Murphy PT, DPT #[id number] Dorriea Murphy 02/24/2020, 10:57 AM  [place] [facility] [address] [place], [place], [address] Phone: [phone]   Fax:  [phone]  Name: Cheyenne Bean MRN: [id number] Date of Birth: 06/04/1956   

## 2020-03-02 ENCOUNTER — Encounter: Payer: Self-pay | Admitting: Physical Therapy

## 2020-03-02 ENCOUNTER — Ambulatory Visit: Payer: BC Managed Care – PPO | Admitting: Physical Therapy

## 2020-03-02 DIAGNOSIS — R42 Dizziness and giddiness: Secondary | ICD-10-CM

## 2020-03-02 DIAGNOSIS — R2681 Unsteadiness on feet: Secondary | ICD-10-CM

## 2020-03-02 NOTE — Therapy (Signed)
[facility] [address] [place], [place], [address] Phone: [phone]   Fax:  [phone]  Physical Therapy Treatment  Patient Details  Name: Cheyenne Bean MRN: [id number] Date of Birth: February 09, 1956 Referring Provider (PT): Dr. Ladene Artist   Encounter Date: 03/02/2020  PT End of Session - 03/02/20 0819    Visit Number  7    Number of Visits  13    Date for PT Re-Evaluation  04/13/20    PT Start Time  0816    PT Stop Time  0904    PT Time Calculation (min)  48 min    Equipment Utilized During Treatment  Gait belt    Activity Tolerance  Patient tolerated treatment well    Behavior During Therapy  [facility] for tasks assessed/performed       Past Medical History:  Diagnosis Date  . BRCA negative 01/2017   MyRisk neg  . Family history of breast cancer    1/18 IBIS=10.7%  . Family history of ovarian cancer    MyRisk neg  . Genital herpes   . Hyperlipidemia   . Hypertension   . IBS (irritable bowel syndrome) 02/20/2017  . Sarcoidosis     Past Surgical History:  Procedure Laterality Date  . BACK SURGERY    . BUNIONECTOMY Left   . COLONOSCOPY  2012   cleared for 10 yrs  . COLONOSCOPY WITH PROPOFOL N/A 02/27/2017   Procedure: COLONOSCOPY WITH PROPOFOL;  Surgeon: Lollie Sails, MD;  Location: [facility] ENDOSCOPY;  Service: Endoscopy;  Laterality: N/A;  . VAGINAL HYSTERECTOMY      There were no vitals filed for this visit.  Subjective Assessment - 03/02/20 0816    Subjective  Patient reports that her symptoms are about the same and states she has been doing VOR X 1 and the body wall roll exercise. Patient states she woke up this morning kind of leaning and with dizziness. Patient states she gets nauesa at times. Patient states she was walking her dog this past week and she felt very off-balance. Patient states she mostly stays around the house. Patient upon arrival reports she has a 7/10 headache and states she normally does not  get headaches. Patient reports she has 8/10 dizziness this morning on arrival. Patient states she is still having lower left sided LBP that she rates 8/10 this date. She states she goes back to the orthopedic doctor the beginning of April.    Pertinent History  Patient reports the dizziness first began about 11 years ago. Patient reports she told her primary doctor, Dr. Ronnald Ramp about her dizziness symptoms and she was referred to the ENT last year. Patient reports her symptoms come and go. Patient reports her dizziness symptoms are now happening every day. Patient reports when she looks up, turning quickly, bending over, walking and quick movements bring on her symptoms. Patient reports meclizine helps decrease the vertigo, but not the dizziness help decrease her symptoms. Patient reports veering when she walks at times. Patient describes oscillopsia and states that when she blinks her eyes she feels that her eyes do not come back into focus. Patient reports she has had vertigo at times as well. Patient reports that the vertigo has improved and now is not as frequent. Patient reports she had a VNG test last week. 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 Patient has a brain MRI ordered for 12/4. Patient reports she avoids movements  because she will get dizziness and nausea. Patient reports she has been out of work since November 1st due to her symptoms. 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 Patient reports that she still does dishes, walks the dog, washes clothes at home, but states she has problems doing these activities due to her dizziness.    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 Patient upon arrival reports she has a 7/10 headache and states she normally does not get headaches. Patient reports she has 8/10 dizziness this morning on arrival. Patient states she is still having lower left sided LBP that she rates 8/10 this date. She states she goes back to the orthopedic doctor the beginning of April.   Neuromuscular Re-education:   VOR X 1 exercise:  Patient performed VOR X 1 horizontal in standing with conflicting background 3 reps of 1 minute each with good technique.  Patient reports 7/10 dizziness with this activity.   Body Wall Rolls:  Patient performed 6 reps of supported, body wall rolls with eyes closed with CGA. Patient required few seconds standing rest break with eyes open between reps.   Patient reports 8/10 dizziness with this activity.   Airex pad:  On Airex pad, patient performed feet together progressions (progressed to feet together) and semi-tandem progressions with alternating lead leg with and without horizontal and vertical head turns with CGA.  Patient reports dizziness and imbalance with this activity. Added these exercises to home exercise program.   Ambulation with head turns:  Patient performed 175' forward ambulation while scanning for targets in hallway and then performed 175' retro ambulation and attempted a second rep however patient required rest break secondary to patient reporting 9/10 dizziness and nausea.      PT Education - 03/02/20 0818    Education Details  reviewed HEP and added feet together and semi-tandem progressions with horizontal and vertical head turns.    Person(s) Educated  Patient    Methods  Explanation;Demonstration;Verbal cues;Handout    Comprehension  Verbalized understanding;Returned demonstration       PT Short Term Goals - 02/20/20 1518      PT SHORT TERM GOAL #1   Title  Patient will be able to perform home program independently for self-management.    Time  4    Period  Weeks    Status   On-going    Target Date  03/16/20      PT SHORT TERM GOAL #2   Title  Patient will reduce perceived disability to low levels as indicated by <70 on Dizziness Handicap Inventory.    Baseline  scored 78/100 on 11/10/19; scored 84/100 on 02/17/2020    Time  4    Period  Weeks    Status  On-going    Target Date  03/16/20        PT Long Term Goals - 02/20/20 1520      PT LONG TERM GOAL #1   Title  Patient will reduce falls risk as indicated by Activities Specific Balance Confidence Scale (ABC) >67%.    Baseline  scored 5% on 02/17/2020    Time  12    Period  Weeks    Status  On-going    Target Date  04/13/20      PT LONG TERM GOAL #2   Title  Patient will reduce perceived disability to low levels as indicated by <40 on Dizziness Handicap Inventory.    Baseline  scored 78/100 on 11/10/19; scored 84/100 on 02/17/2020    Time  12    Period  Weeks    Status  On-going    Target Date  04/13/20      PT LONG TERM GOAL #3   Title  Patient will report 50% or greater improvement in her symptoms of dizziness and imbalance with provoking motions or positions.    Time  12    Period  Weeks    Status  On-going    Target Date  04/13/20      PT LONG TERM GOAL #4   Title  Patient will be able to return to work and be able to walk her dog without imbalance.    Time  12    Period  Weeks    Status  On-going    Target Date  04/13/20            Plan - 03/02/20 1159    Clinical Impression Statement  Patient reports that she is trying to be compliant with performing her home exercise program this time. Patient worked on progressions of VOR with conflicting background and body wall rolls with eyes closed in the clinic this date and added conflicting background with VOR to HEP. In addition, added feet together and semi-tandem progressions with horizontal and vertical head turns to HEP. Patient had difficulty with retro ambulation with head turns due to increased dizziness and nausea. Patient would  benefit from continued PT services to try to address goals.    Personal Factors and Comorbidities  Comorbidity 2;Time since onset of injury/illness/exacerbation    Comorbidities  HTN, hyperlipidemia    Examination-Activity Limitations  Bend;Lift    Examination-Participation Restrictions  Other   work, talking the dog on walks   Stability/Clinical Decision Making  Evolving/Moderate complexity    Rehab Potential  Good    PT Frequency  1x / week    PT Duration  12 weeks    PT Treatment/Interventions  Canalith Repostioning;Gait training;Stair training;Therapeutic activities;Balance training;Therapeutic exercise;Neuromuscular re-education;Patient/family education;Vestibular    PT Next Visit Plan  review HEP; progress VOR X 1 and add additional exercises to HEP.    PT Home Exercise Plan  feet together with head turns horizontal and vertical; VOR X 1 in sitting 30 second reps    Consulted and Agree with Plan of Care  Patient       Patient will benefit from skilled therapeutic intervention in order to improve the following deficits and impairments:  Decreased balance, Difficulty walking, Dizziness  Visit Diagnosis: Dizziness and giddiness  Unsteadiness on feet     Problem List Patient Active Problem List   Diagnosis Date Noted  . Pure hypercholesterolemia 11/04/2016  . Essential hypertension 09/18/2015  . Hyperlipidemia 09/18/2015  . Recurrent genital herpes simplex 09/18/2015   Lady Deutscher PT, DPT [phone] Lady Deutscher 03/02/2020, 12:02 PM  [place] [facility] [address] [place], [place], [address] Phone: [phone]   Fax:  [phone]  Name: Cheyenne Bean MRN: [id number] Date of Birth: Oct 13, 1956

## 2020-03-09 ENCOUNTER — Ambulatory Visit: Payer: BC Managed Care – PPO

## 2020-03-16 ENCOUNTER — Other Ambulatory Visit: Payer: Self-pay | Admitting: Family Medicine

## 2020-03-16 ENCOUNTER — Ambulatory Visit: Payer: BC Managed Care – PPO | Admitting: Physical Therapy

## 2020-03-16 ENCOUNTER — Other Ambulatory Visit: Payer: Self-pay

## 2020-03-16 ENCOUNTER — Ambulatory Visit: Payer: BC Managed Care – PPO

## 2020-03-16 DIAGNOSIS — M545 Low back pain, unspecified: Secondary | ICD-10-CM

## 2020-03-16 DIAGNOSIS — E7800 Pure hypercholesterolemia, unspecified: Secondary | ICD-10-CM

## 2020-03-16 DIAGNOSIS — R42 Dizziness and giddiness: Secondary | ICD-10-CM

## 2020-03-16 DIAGNOSIS — R2681 Unsteadiness on feet: Secondary | ICD-10-CM

## 2020-03-16 DIAGNOSIS — G8929 Other chronic pain: Secondary | ICD-10-CM

## 2020-03-17 ENCOUNTER — Encounter: Payer: Self-pay | Admitting: Physical Therapy

## 2020-03-17 NOTE — Therapy (Signed)
[facility] [address] [place], [place], [address] Phone: [phone]   Fax:  [phone]  Physical Therapy Evaluation  Patient Details  Name: Cheyenne Bean MRN: [id number] Date of Birth: Jun 07, 1956 Referring Provider:  Chipper Herb, NP  Encounter Date: 03/16/2020  PT End of Session - 03/17/20 1309    Visit Number  8    Number of Visits  13    Date for PT Re-Evaluation  04/13/20    PT Start Time  0759    PT Stop Time  0936    PT Time Calculation (min)  97 min    Equipment Utilized During Treatment  Gait belt    Activity Tolerance  Patient tolerated treatment well;Patient limited by pain    Behavior During Therapy  [facility] for tasks assessed/performed       Past Medical History:  Diagnosis Date  . BRCA negative 01/2017   MyRisk neg  . Family history of breast cancer    1/18 IBIS=10.7%  . Family history of ovarian cancer    MyRisk neg  . Genital herpes   . Hyperlipidemia   . Hypertension   . IBS (irritable bowel syndrome) 02/20/2017  . Sarcoidosis     Past Surgical History:  Procedure Laterality Date  . BACK SURGERY    . BUNIONECTOMY Left   . COLONOSCOPY  2012   cleared for 10 yrs  . COLONOSCOPY WITH PROPOFOL N/A 02/27/2017   Procedure: COLONOSCOPY WITH PROPOFOL;  Surgeon: Lollie Sails, MD;  Location: [facility] ENDOSCOPY;  Service: Endoscopy;  Laterality: N/A;  . VAGINAL HYSTERECTOMY      There were no vitals filed for this visit.   Subjective Assessment - 03/17/20 1306    Subjective  Pt. arrived to PT to FCE.  Pt. has been out of work since 10/18/2019 due to back injury.  Pt. works as a Clinical research associate at [employer].  Pt. also reports constant tinnitus/ dizziness which worsens with increase activity/ changes in head position.  Pt. reports 7/10 low back pain prior to FCE.    Pertinent History  Patient reports the dizziness first began about 11 years ago. Patient reports she told her primary doctor, Dr. Ronnald Ramp about her  dizziness symptoms and she was referred to the ENT last year. Patient reports her symptoms come and go. Patient reports her dizziness symptoms are now happening every day. Patient reports when she looks up, turning quickly, bending over, walking and quick movements bring on her symptoms. Patient reports meclizine helps decrease the vertigo, but not the dizziness help decrease her symptoms. Patient reports veering when she walks at times. Patient describes oscillopsia and states that when she blinks her eyes she feels that her eyes do not come back into focus. Patient reports she has had vertigo at times as well. Patient reports that the vertigo has improved and now is not as frequent. Patient reports she had a VNG test last week. 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 Patient has a brain MRI ordered for 12/4. Patient reports she avoids movements because she will get dizziness and nausea. Patient reports she has been out of work since November 1st due to her symptoms. 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 Patient reports that she still does dishes, walks the dog, washes clothes at home, but states she has problems doing these activities due to her dizziness.  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  Yes    Pain Score  7     Pain Location  Back    Pain Orientation  Lower;Mid    Pain Descriptors / Indicators  Sharp;Shooting;Stabbing;Aching    Pain Type  Chronic pain         FCE report faxed to MD office on 03/17/2020 at 1:17 PM      Plan - 03/17/20 1313    Clinical Impression Statement  Overall Level of Work: Falls within the [employer] range.  Exerting up to 20 pounds of force occasionally, and/or  up to 10 pounds of force frequently, and/or a negligible amount of force constantly (Constantly: activity or condition exist 2/3 or more of the time) to move objects.  Physical demand requirements are in excess of those for Sedentary Work.  Even though the weight lifted may be only a negligible amount, a job should be rated Light Work: (1) when it requires walking or standing to significant degree; or (2) when it requires sitting most of the time but entails pushing and/or pulling of arm or leg controls; and/or (3) when the job requires working at a production rate pace entailing the constant pushing and/or pulling of materials even though the weight of those materials is negligible.  NOTE: The constant stress and strain of maintaining a production rate pace, especially in an industrial setting, can be and is physically demanding of a worker even though the amount of force exerted is negligible.    Please see the Task Performance Table for specific abilities.  Tolerance for the 8-Hour Day: Due to the client's limited ability to walk and stand, she would have to alternate between standing, walking and other tasks as listed in the task performance table to be able to tolerate the Light level of work for the 8-hour day/40-hour week.    Personal Factors and Comorbidities  Comorbidity 2;Time since onset of injury/illness/exacerbation    Comorbidities  HTN, hyperlipidemia    Examination-Activity Limitations  Bend;Lift    Examination-Participation Restrictions  Other   work, talking the dog on walks   Stability/Clinical Decision Making  Evolving/Moderate complexity    Clinical Decision Making  Moderate    Rehab Potential  Good    PT Frequency  1x / week    PT Duration  12 weeks    PT Treatment/Interventions  Canalith Repostioning;Gait training;Stair training;Therapeutic activities;Balance training;Therapeutic exercise;Neuromuscular re-education;Patient/family education;Vestibular;Functional mobility training     PT Next Visit Plan  review HEP; progress VOR X 1 and add additional exercises to HEP.    PT Home Exercise Plan  feet together with head turns horizontal and vertical; VOR X 1 in sitting 30 second reps    Recommended Other Services  FCE completed.  Short-term disability and SSI paperwork completed and faxed/mailed.  FCE report faxed to Chipper Herb, NP    Consulted and Agree with Plan of Care  Patient       Patient will benefit from skilled therapeutic intervention in order to improve the following deficits and impairments:  Decreased balance, Difficulty walking, Dizziness  Visit Diagnosis: Dizziness and giddiness  Unsteadiness on feet  Chronic midline low back pain without sciatica     Problem List Patient Active Problem List   Diagnosis Date Noted  . Pure  hypercholesterolemia 11/04/2016  . Essential hypertension 09/18/2015  . Hyperlipidemia 09/18/2015  . Recurrent genital herpes simplex 09/18/2015   Pura Spice, PT, DPT # [id number] 03/17/2020, 1:20 PM  [facility] [address] [place], [place], [address] Phone: [phone]   Fax:  [phone]  Name: Cheyenne Bean MRN: [id number] Date of Birth: May 07, 1956

## 2020-03-20 NOTE — Progress Notes (Signed)
 Referring Provider:   Nida Rosina Fuller, Np [address] [place],  [place] [address]  Chief Complaint:   Dizziness  Subjective:    Cheyenne Bean is a 64 y.o. female who is seen in consultation from NP Stepp for evaluation of dizziness.  Dizziness has been present the past 1 year. Dizziness is described as off balance and unsteadiness. She feels like she will fall and also endorses lightheadedness. She feels off balance all of the time. Dizziness is present mostly when standing and walking. Every time she stands to walk she feels dizzy and has nausea. When lying down she does not feel off balance. She also feels okay when seated.   She reports intermittent vertigo the past 12 years. It occurs when looking up and is described as spinning. It happens with fast head movements of looking left or right. It happens about 3x/month. Spinning lasts about 20 minutes. When she experiences spinning she rests in bed and spinning resolves. She denies hearing changes when spinning. She does not notice a change tinnitus when spinning. Last had vertigo 1 month ago.   She has headaches but not often - once monthly.   She hears a constant noise in her ears like a cymbal. It is more noticeable in quiet settings. Tinnitus has been present the past 1 year. It does not keep her awake at night. She watches TV before bed. She notices hearing loss. She asks others to repeat themselves. Denies otalgia, otorrhea and aural pressure.   She has been working with vestibular rehab (VRT) the past 6 months, once / week. It has not helped her. She had vestibular testing prior to VRT but does not recall testing results. 20 mg nortriptyline nightly has not helped her. She is no longer taking it. She is on 50 mg sertraline  daily. This was started by PCP and titrated up by neurology. Dizziness persists despite sertraline .   She used to listen to loud music in the car and at home. Never had ear surgery. Denies family history of  hearing loss.   She works 3rd shift at [employer].   Imaging reviewed:  02/11/2020 MR ANGIO HEAD WO CONTRAST IMPRESSION: Negative intracranial MRA. No vertebrobasilar stenosis.  01/03/2020 CAROTID US  BILATERAL  Minimal amount of bilateral intimal thickening/srherosclerotic plaque, right subjectively greater than left, not resulting in a hemodynamically significant stenosis within either internal carotid artery.    11/25/2019 MR BRAIN W WO CONTRAST IMPRESSION: 1. No acute or focal lesion to explain the patient's hearing loss or tinnitus. 2. Normal MRI appearance of the brain.  Past Medical History, Surgical History, Medications, Allergies, Social History:  Past medical, surgical, social history including medications and allergies are reviewed.   Review of Systems Complete ROS obtained by nursing staff and reviewed by me. Pertinent positives and negatives listed in the nursing note.   Audiometric Data 03/20/2020:  An audiogram was performed and was reviewed and analyzed personally by myself.   Pure tone testing reveals:  Right ear: Normal hearing [id number] Hz and 4000 Hz with mild SNHL in remaining frequencies Left Ear: Normal hearing save for mild SNHL 6000 Hz only     Tympanometry reveals: Right: Type A Left: Type A  Speech audiometry reveals:  Right: 96% Left: 96%         Objective:   Vital Signs:  BP 132/89   Pulse 76   Temp 37.1 C (98.7 F) (Oral)   LMP  (LMP Unknown)   General:  General appearance of  this patient is normal with regard to development, nutrition, attention to grooming, and lack of deformities. Patient's ability to communicate is normal. No acute distress.   Head and Face:  The overall appearance of the head and face is normal.   Facial strength is normal.  Eyes: Extraocular movements are intact and primary gaze is normal.  No gaze nystagmus is noted.   Conjunctiva appears normal bilaterally.  External Ears:  Assessment of hearing by  clinical speech reception is normal.   External inspection of the ears is normal with no evidence of otorrhea, erythema, edema, lesions, or scars.  External Auditory Canal:  Inspection of the RIGHT external auditory canal reveals no erythema, edema, or otorrhea. There are no masses, debris, or obstruction of the canal. The canal wall is non-tender.  Inspection of the LEFT external auditory canal reveals no erythema, edema, or otorrhea. There are no masses, debris, or obstruction of the canal. The canal wall is non-tender.  Tympanic Membrane: Bilateral microscopic otoscopic examination reveals:   The RIGHT tympanic membrane is normal with good mobility. There is no evidence of perforation or retraction of the tympanic membrane. No infection, otorrhea or inflammation. No middle ear effusion or masses are visualized.  The LEFT tympanic membrane is normal with good mobility. There is no evidence of perforation or retraction of the tympanic membrane. No infection, otorrhea or inflammation. No middle ear effusion or masses are visualized.  Dix Hallpike + for subjective spinning (<10 seconds duration) bilaterally but negative for nystagmus bilaterally. Epley maneuvers deferred.   Nose: Inspection of the external nose reveals an intact nasal bridge without external deviation.   Oropharynx:  Lips, teeth, and gums are normal. Oral cavity reveals no evidence of gingival or palatal lesions or masses. There is symmetric tongue motion,  symmetric palate elevation, and an intact gag reflex. No evidence of any lesions of the oral mucosa are noted.   Examination of the oropharynx reveals no evidence of asymmetry, lesions or masses. No evidence of tonsillar hypertrophy. The posterior pharynx is normal without mass or lesion.   Neck:  Overall appearance of the neck is normal and symmetric with normal tracheal position and no crepitus.  Respiratory: There were no visible intercostal retractions or evidence  of the use of accessory muscles of respiration.  Lymphatic: The lymph nodes of the neck are negative bilaterally.  Neurologic and Psychiatric: Cranial nerves 2-12 are intact.    The patient was oriented to person, place, and time. The patient's mood and affect appear normal.  Skin: No lesions or masses noted on the dermis of the head, face, and neck.     Diagnosis for this encounter:      ICD-10-CM   1. Dizziness  R42 Ambulatory Referral to Vestibular Lab  2. Asymmetrical right sensorineural hearing loss  H90.41   3. Tinnitus, bilateral  H93.13    Discussion and Plan:    1. It is my impression that her daily dizziness the past 1 year is due to Persistent Posture-Perceptual Dizziness (PPPD). She is already on SSRI therapy. We discussed CBT as another therapeutic option. Her intermittent vertigo sounds c/w BPPV but Trenda Craze was negative for nystagmus today. Epley maneuvers were not performed.  2. I recommended patient return for vestibular testing to evaluate for BPPV vs vestibular hypofunction. I will see her after testing is completed.   3. Pending test results she may benefit from a trial of a different SSRI and we can discuss this possibility at follow up. She  can continue to work with VRT.   4. I reviewed today's audiogram with the patient. She has an asymmetric right SNHL in the high frequencies. This raises the possibility for an acoustic neuroma which is a benign, slow growing, and rare tumor. She had an MRI at an outside facility in December 2020. I asked her to contact that facility and request her MRI images on a CD to bring to follow up for my review. If the MRI is benign she will be reassured and cleared for hearing aids, if interested. If retrocochlear pathology is found she will need to see one of our otologists.  5. We reviewed tinnitus masking strategies, including the use of white noise. We discussed that hearing aids may also help with tinnitus masking.     Follow up in my clinic with vestibular testing prior, in 2-3 weeks.   The diagnoses and treatment plan were reviewed with the patient. There were no barriers to understanding. The patient verbalized understanding and agreement with the plan.  I personally performed the service, non-incident to. [facility])   BENJAMIN DENT [place], PA Morene BIRCH. L[place], PA-C

## 2020-03-23 ENCOUNTER — Ambulatory Visit: Payer: BC Managed Care – PPO | Attending: Otolaryngology | Admitting: Physical Therapy

## 2020-03-23 ENCOUNTER — Encounter: Payer: Self-pay | Admitting: Physical Therapy

## 2020-03-23 DIAGNOSIS — R42 Dizziness and giddiness: Secondary | ICD-10-CM

## 2020-03-23 DIAGNOSIS — R2681 Unsteadiness on feet: Secondary | ICD-10-CM | POA: Insufficient documentation

## 2020-03-23 NOTE — Therapy (Signed)
[facility] [address] [place], [place], [address] Phone: [phone]   Fax:  [phone]  Physical Therapy Treatment  Patient Details  Name: Cheyenne Bean MRN: [id number] Date of Birth: 02-Dec-1956 Referring Provider (PT): Chipper Herb, NP   Encounter Date: 03/23/2020  PT End of Session - 03/23/20 1255    Visit Number  9    Number of Visits  13    Date for PT Re-Evaluation  04/13/20    PT Start Time  0811    PT Stop Time  0858    PT Time Calculation (min)  47 min    Equipment Utilized During Treatment  Gait belt    Activity Tolerance  Patient tolerated treatment well;Patient limited by pain    Behavior During Therapy  [facility] for tasks assessed/performed       Past Medical History:  Diagnosis Date  . BRCA negative 01/2017   MyRisk neg  . Family history of breast cancer    1/18 IBIS=10.7%  . Family history of ovarian cancer    MyRisk neg  . Genital herpes   . Hyperlipidemia   . Hypertension   . IBS (irritable bowel syndrome) 02/20/2017  . Sarcoidosis     Past Surgical History:  Procedure Laterality Date  . BACK SURGERY    . BUNIONECTOMY Left   . COLONOSCOPY  2012   cleared for 10 yrs  . COLONOSCOPY WITH PROPOFOL N/A 02/27/2017   Procedure: COLONOSCOPY WITH PROPOFOL;  Surgeon: Lollie Sails, MD;  Location: [facility] ENDOSCOPY;  Service: Endoscopy;  Laterality: N/A;  . VAGINAL HYSTERECTOMY      There were no vitals filed for this visit.  Subjective Assessment - 03/23/20 0812    Subjective  Patient reports she is still the same and nothing has changed at all. Patient states she is going to a new neurologist at [facility]. Patient states she had a hearing test. Patient reports that the doctor is ordering a VNG test and she has follow-up appt on April 26. Patient reports she saw her back doctor yesterday and she said surgery would not be a good option in regards to her back pain. Patient reports she has a follow-up with the  back doctor again later this month. Patient states she got a headache after seeing the neurologist. Patient states that she is frustrated. Patient reports the dizziness is always still there and states that it worsens at times and states she does not know what causes it. Patient states she has nausea this morning. Patient states she has medicine for the nausea and states it helps a little bit. Patient states she just started the medication recently.   Pertinent History  Patient reports the dizziness first began about 11 years ago. Patient reports she told her primary doctor, Dr. Ronnald Ramp about her dizziness symptoms and she was referred to the ENT last year. Patient reports her symptoms come and go. Patient reports her dizziness symptoms are now happening every day. Patient reports when she looks up, turning quickly, bending over, walking and quick movements bring on her symptoms. Patient reports meclizine helps decrease the vertigo, but not the dizziness help decrease her symptoms. Patient reports veering when she walks at times. Patient describes oscillopsia and states that when she blinks her eyes she feels that her eyes do not come back into focus. Patient reports she has had vertigo at times as well. Patient reports that the vertigo has improved and now is not as frequent. Patient  reports she had a VNG test last week. 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 Patient has a brain MRI ordered for 12/4. Patient reports she avoids movements because she will get dizziness and nausea. Patient reports she has been out of work since November 1st due to her symptoms. 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 Patient reports that she still does dishes, walks the dog, washes clothes at home, but states she has problems doing these activities due to her dizziness.    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 Patient states she has nausea this morning. Patient states she has medicine for the nausea and states it helps a little bit. Patient states she just started the medication recently.  Neuromuscular Re-education:  Patient reports 7/10 dizziness at rest at beginning of session.  VOR X 1 exercise:  Patient performed walking VOR X 1 horizontal with conflicting background 3 reps without veering. Patient reports this activity was difficult to coordinate.   Ambulation with head turns:  Patient performed multiple reps of forward and retro 175' ambulation with initially horizontal head turns and then performed diagonal head turns with CGA. Patient reports that she is dizzy, but reports no increase in dizziness with this activity. Patient performed with decreased cadence but no veering, swaying or lose of balance. Patient required a few short standing about 15 seconds standing rest breaks.   Body Wall Rolls:  Patient performed 1 rep of supported, body wall rolls with eyes open.  Patient performed 6 reps of unsupported, body wall rolls with eyes closed with CGA. Patient had no loss of balance.  Patient reports 8/10 dizziness with this activity and required short sitting rest break.   Airex balance beam: On Airex balance beam, performed sideways stance static holds with horizontal and diagonal head turns.  On Airex balance beam, performed semi-tandem stance static holds with horizontal and diagonal head turns    PT Education - 03/23/20 0817    Education Details  Added diagonal head turns to feet together and semi-tandem progression exercises for HEP    Person(s) Educated  Patient    Methods   Explanation    Comprehension  Verbalized understanding       PT Short Term Goals - 03/23/20 1752      PT SHORT TERM GOAL #1   Title  Patient will be able to perform home program independently for self-management.    Time  4    Period  Weeks    Status  Achieved    Target Date  03/16/20      PT SHORT TERM GOAL #2   Title  Patient will reduce perceived disability to low levels as indicated by <70 on Dizziness Handicap Inventory.    Baseline  scored 78/100 on 11/10/19; scored 84/100 on 02/17/2020    Time  4    Period  Weeks    Status  On-going    Target Date  03/16/20        PT Long Term Goals - 02/20/20 1520  PT LONG TERM GOAL #1   Title  Patient will reduce falls risk as indicated by Activities Specific Balance Confidence Scale (ABC) >67%.    Baseline  scored 5% on 02/17/2020    Time  12    Period  Weeks    Status  On-going    Target Date  04/13/20      PT LONG TERM GOAL #2   Title  Patient will reduce perceived disability to low levels as indicated by <40 on Dizziness Handicap Inventory.    Baseline  scored 78/100 on 11/10/19; scored 84/100 on 02/17/2020    Time  12    Period  Weeks    Status  On-going    Target Date  04/13/20      PT LONG TERM GOAL #3   Title  Patient will report 50% or greater improvement in her symptoms of dizziness and imbalance with provoking motions or positions.    Time  12    Period  Weeks    Status  On-going    Target Date  04/13/20      PT LONG TERM GOAL #4   Title  Patient will be able to return to work and be able to walk her dog without imbalance.    Time  12    Period  Weeks    Status  On-going    Target Date  04/13/20            Plan - 03/23/20 1746    Clinical Impression Statement  Patient reports compliance with HEP and states she has no change in her symptoms. Patient motivated to session and able to work on walking VOR X 1 with conflicting background progression and eyes closed with supported body wall rolls. Patient  reported increased dizziness with body wall rolls with eyes closed activity, which incorporates quick turns. Plan on doing quick turning activties and doing ball toss over shoulder while ambulating exercises and repeating functional outcome measures next session. Patient would benefit from continued PT services to try to address goals and functional deficits.    Personal Factors and Comorbidities  Comorbidity 2;Time since onset of injury/illness/exacerbation    Comorbidities  HTN, hyperlipidemia    Examination-Activity Limitations  Bend;Lift    Examination-Participation Restrictions  Other   work, talking the dog on walks   Stability/Clinical Decision Making  Evolving/Moderate complexity    Rehab Potential  Good    PT Frequency  1x / week    PT Duration  12 weeks    PT Treatment/Interventions  Canalith Repostioning;Gait training;Stair training;Therapeutic activities;Balance training;Therapeutic exercise;Neuromuscular re-education;Patient/family education;Vestibular;Functional mobility training    PT Next Visit Plan  review HEP; progress VOR X 1 and add additional exercises to HEP.    PT Home Exercise Plan  feet together with head turns horizontal and vertical; VOR X 1 in sitting 30 second reps    Consulted and Agree with Plan of Care  Patient       Patient will benefit from skilled therapeutic intervention in order to improve the following deficits and impairments:  Decreased balance, Difficulty walking, Dizziness  Visit Diagnosis: Dizziness and giddiness  Unsteadiness on feet     Problem List Patient Active Problem List   Diagnosis Date Noted  . Pure hypercholesterolemia 11/04/2016  . Essential hypertension 09/18/2015  . Hyperlipidemia 09/18/2015  . Recurrent genital herpes simplex 09/18/2015    Lady Deutscher PT, DPT [phone] Lady Deutscher 03/23/2020, 5:53 PM  [facility] [address] [place], [place], [address] Phone:  [phone]   Fax:  [phone]  Name: Cheyenne Bean MRN: [id number] Date of Birth: [birth year]

## 2020-04-03 ENCOUNTER — Ambulatory Visit (INDEPENDENT_AMBULATORY_CARE_PROVIDER_SITE_OTHER): Payer: BC Managed Care – PPO | Admitting: Family Medicine

## 2020-04-03 ENCOUNTER — Other Ambulatory Visit: Payer: Self-pay

## 2020-04-03 ENCOUNTER — Encounter: Payer: Self-pay | Admitting: Family Medicine

## 2020-04-03 VITALS — BP 120/80 | HR 80 | Ht 65.0 in | Wt 162.0 lb

## 2020-04-03 DIAGNOSIS — I10 Essential (primary) hypertension: Secondary | ICD-10-CM

## 2020-04-03 DIAGNOSIS — A6000 Herpesviral infection of urogenital system, unspecified: Secondary | ICD-10-CM

## 2020-04-03 DIAGNOSIS — E7800 Pure hypercholesterolemia, unspecified: Secondary | ICD-10-CM

## 2020-04-03 MED ORDER — PRAVASTATIN SODIUM 20 MG PO TABS: 20.0000 mg | ORAL_TABLET | Freq: Every day | ORAL | 1 refills | Status: DC

## 2020-04-03 MED ORDER — LISINOPRIL-HYDROCHLOROTHIAZIDE 20-25 MG PO TABS: 1.0000 | ORAL_TABLET | Freq: Every day | ORAL | 1 refills | Status: DC

## 2020-04-03 MED ORDER — VALACYCLOVIR HCL 500 MG PO TABS: ORAL_TABLET | ORAL | 1 refills | Status: DC

## 2020-04-03 NOTE — Progress Notes (Signed)
Date:  04/03/2020   Name:  Cheyenne Bean   DOB:  1956-02-09   MRN:  [id number]   Chief Complaint: Hypertension, Hyperlipidemia, and genital herpes (takes valacyclovir)  Hypertension This is a chronic problem. The current episode started more than 1 year ago. The problem has been gradually improving since onset. The problem is controlled. Pertinent negatives include no anxiety, blurred vision, chest pain, headaches, malaise/fatigue, neck pain, orthopnea, palpitations, peripheral edema, PND, shortness of breath or sweats. There are no associated agents to hypertension. There are no known risk factors for coronary artery disease. Past treatments include ACE inhibitors and diuretics. The current treatment provides moderate improvement. There are no compliance problems.  There is no history of angina, kidney disease, CAD/MI, CVA, heart failure, left ventricular hypertrophy, PVD or retinopathy. There is no history of chronic renal disease, a hypertension causing med or renovascular disease.  Hyperlipidemia This is a chronic problem. The current episode started more than 1 year ago. The problem is controlled. Recent lipid tests were reviewed and are normal. She has no history of chronic renal disease, diabetes, hypothyroidism, liver disease, obesity or nephrotic syndrome. Pertinent negatives include no chest pain, myalgias or shortness of breath. Current antihyperlipidemic treatment includes statins. The current treatment provides moderate improvement of lipids. There are no compliance problems.     Lab Results  Component Value Date   CREATININE 0.81 08/05/2019   BUN 20 08/05/2019   NA 141 08/05/2019   K 3.5 08/05/2019   CL 100 08/05/2019   CO2 26 08/05/2019   Lab Results  Component Value Date   CHOL 187 08/05/2019   HDL 51 08/05/2019   LDLCALC 119 (H) 08/05/2019   TRIG 84 08/05/2019   CHOLHDL 3.2 03/26/2018   No results found for: TSH No results found for: HGBA1C No results found  for: WBC, HGB, HCT, MCV, PLT Lab Results  Component Value Date   ALT 24 08/05/2019   AST 21 08/05/2019   ALKPHOS 101 08/05/2019   BILITOT 0.3 08/05/2019     Review of Systems  Constitutional: Negative.  Negative for chills, fatigue, fever, malaise/fatigue and unexpected weight change.  HENT: Negative for congestion, ear discharge, ear pain, rhinorrhea, sinus pressure, sneezing and sore throat.   Eyes: Negative for blurred vision, photophobia, pain, discharge, redness and itching.  Respiratory: Negative for cough, shortness of breath, wheezing and stridor.   Cardiovascular: Negative for chest pain, palpitations, orthopnea and PND.  Gastrointestinal: Negative for abdominal pain, blood in stool, constipation, diarrhea, nausea and vomiting.  Endocrine: Negative for cold intolerance, heat intolerance, polydipsia, polyphagia and polyuria.  Genitourinary: Negative for dysuria, flank pain, frequency, hematuria, menstrual problem, pelvic pain, urgency, vaginal bleeding and vaginal discharge.  Musculoskeletal: Negative for arthralgias, back pain, myalgias and neck pain.  Skin: Negative for rash.  Allergic/Immunologic: Negative for environmental allergies and food allergies.  Neurological: Negative for dizziness, weakness, light-headedness, numbness and headaches.  Hematological: Negative for adenopathy. Does not bruise/bleed easily.  Psychiatric/Behavioral: Negative for dysphoric mood. The patient is not nervous/anxious.     Patient Active Problem List   Diagnosis Date Noted  . Pure hypercholesterolemia 11/04/2016  . Essential hypertension 09/18/2015  . Hyperlipidemia 09/18/2015  . Recurrent genital herpes simplex 09/18/2015    No Known Allergies  Past Surgical History:  Procedure Laterality Date  . BACK SURGERY    . BUNIONECTOMY Left   . COLONOSCOPY  2012   cleared for 10 yrs  . COLONOSCOPY WITH PROPOFOL N/A 02/27/2017   Procedure:  COLONOSCOPY WITH PROPOFOL;  Surgeon: Christena Deem, MD;  Location: [facility] ENDOSCOPY;  Service: Endoscopy;  Laterality: N/A;  . VAGINAL HYSTERECTOMY      Social History   Tobacco Use  . Smoking status: Never Smoker  . Smokeless tobacco: Never Used  Substance Use Topics  . Alcohol use: No    Alcohol/week: 0.0 standard drinks  . Drug use: No     Medication list has been reviewed and updated.  Current Meds  Medication Sig  . diclofenac sodium (VOLTAREN) 1 % GEL Apply 2 g topically 4 (four) times daily.  Marland Kitchen lisinopril-hydrochlorothiazide (ZESTORETIC) 20-25 MG tablet Take 1 tablet by mouth daily.  . meclizine (ANTIVERT) 25 MG tablet Take 1 tablet (25 mg total) by mouth 3 (three) times daily as needed for dizziness.  . pravastatin (PRAVACHOL) 20 MG tablet Take 1 tablet by mouth once daily  . sertraline (ZOLOFT) 25 MG tablet Take 1 tablet by mouth daily. 25+50=75 per [place]  . sertraline (ZOLOFT) 50 MG tablet Take 1 tablet (50 mg total) by mouth daily.  . Turmeric 500 MG CAPS Take by mouth.  . valACYclovir (VALTREX) 500 MG tablet TAKE 1 TABLET BY MOUTH ONCE DAILY SCHEDULE AN APPOINTMENT    PHQ 2/9 Scores 04/03/2020 08/05/2019 10/15/2018 03/16/2018  PHQ - 2 Score 2 0 1 1  PHQ- 9 Score 2 0 2 3    BP Readings from Last 3 Encounters:  04/03/20 120/80  12/21/19 (!) 141/87  12/14/19 116/64    Physical Exam Vitals and nursing note reviewed.  Constitutional:      Appearance: She is well-developed.  HENT:     Head: Normocephalic.     Right Ear: Tympanic membrane, ear canal and external ear normal.     Left Ear: Tympanic membrane, ear canal and external ear normal.     Nose: Nose normal. No congestion or rhinorrhea.     Mouth/Throat:     Mouth: Mucous membranes are moist.  Eyes:     General: Lids are everted, no foreign bodies appreciated. No scleral icterus.       Left eye: No foreign body or hordeolum.     Conjunctiva/sclera: Conjunctivae normal.     Right eye: Right conjunctiva is not injected.     Left eye: Left  conjunctiva is not injected.     Pupils: Pupils are equal, round, and reactive to light.  Neck:     Thyroid: No thyromegaly.     Vascular: No JVD.     Trachea: No tracheal deviation.  Cardiovascular:     Rate and Rhythm: Normal rate and regular rhythm.     Heart sounds: Normal heart sounds. No murmur. No friction rub. No gallop.   Pulmonary:     Effort: Pulmonary effort is normal. No respiratory distress.     Breath sounds: Normal breath sounds. No wheezing, rhonchi or rales.  Abdominal:     General: Bowel sounds are normal.     Palpations: Abdomen is soft. There is no mass.     Tenderness: There is no abdominal tenderness. There is no guarding or rebound.  Musculoskeletal:        General: No tenderness. Normal range of motion.     Cervical back: Normal range of motion and neck supple.  Lymphadenopathy:     Cervical: No cervical adenopathy.  Skin:    General: Skin is warm.     Findings: No rash.  Neurological:     Mental Status: She is alert and  oriented to person, place, and time.     Cranial Nerves: No cranial nerve deficit.     Deep Tendon Reflexes: Reflexes normal.  Psychiatric:        Mood and Affect: Mood is not anxious or depressed.     Wt Readings from Last 3 Encounters:  04/03/20 162 lb (73.5 kg)  12/21/19 162 lb (73.5 kg)  08/05/19 155 lb (70.3 kg)    BP 120/80   Pulse 80   Ht 5\' 5"  (1.651 m)   Wt 162 lb (73.5 kg)   BMI 26.96 kg/m   Assessment and Plan:   1. Essential hypertension .  Controlled.  Stable.  Will continue lisinopril hydrochlorothiazide 20-25 mg once a day.  Will check renal function panel for evaluation of GFR. - lisinopril-hydrochlorothiazide (ZESTORETIC) 20-25 MG tablet; Take 1 tablet by mouth daily.  Dispense: 90 tablet; Refill: 1 - Renal function panel  2. Pure hypercholesterolemia Chronic.  Controlled.  Stable.  Continue pravastatin 20 mg once a day.  Will check lipid panel we will recheck patient in 6 months. - pravastatin  (PRAVACHOL) 20 MG tablet; Take 1 tablet (20 mg total) by mouth daily.  Dispense: 90 tablet; Refill: 1 - Lipid Panel With LDL/HDL Ratio  3. Recurrent genital herpes simplex Chronic.  Controlled.  Stable.  Patient is on suppression with valacyclovir 500 mg once a day. - valACYclovir (VALTREX) 500 MG tablet; TAKE 1 TABLET BY MOUTH ONCE DAILY SCHEDULE AN APPOINTMENT  Dispense: 90 tablet; Refill: 1

## 2020-04-04 LAB — LIPID PANEL WITH LDL/HDL RATIO
Cholesterol, Total: 170 mg/dL (ref 100–199)
HDL: 45 mg/dL (ref >39)
LDL Chol Calc (NIH): 112 mg/dL — ABNORMAL HIGH (ref 0–99)
LDL/HDL Ratio: 2.5 ratio (ref 0.0–3.2)
Triglycerides: 65 mg/dL (ref 0–149)
VLDL Cholesterol Cal: 13 mg/dL (ref 5–40)

## 2020-04-04 LAB — RENAL FUNCTION PANEL
Albumin: 4.8 g/dL (ref 3.8–4.8)
BUN/Creatinine Ratio: 28 (ref 12–28)
BUN: 21 mg/dL (ref 8–27)
CO2: 27 mmol/L (ref 20–29)
Calcium: 10 mg/dL (ref 8.7–10.3)
Chloride: 104 mmol/L (ref 96–106)
Creatinine, Ser: 0.75 mg/dL (ref 0.57–1.00)
GFR calc Af Amer: 99 mL/min/{1.73_m2} (ref >59)
GFR calc non Af Amer: 86 mL/min/{1.73_m2} (ref >59)
Glucose: 101 mg/dL — ABNORMAL HIGH (ref 65–99)
Phosphorus: 3.2 mg/dL (ref 3.0–4.3)
Potassium: 4.1 mmol/L (ref 3.5–5.2)
Sodium: 142 mmol/L (ref 134–144)

## 2020-04-06 ENCOUNTER — Ambulatory Visit: Payer: BC Managed Care – PPO

## 2020-04-18 NOTE — Progress Notes (Signed)
 Chief Complaint:   F/U dizziness  Subjective:    Cheyenne Bean is a 64 y.o. female who is seen for follow up of dizziness.  I saw her 03/21/2020 for dizziness the preceding 1 year described as off-balance and unsteadiness and lightheadedness. Symptoms present when standing and walking but not present when supine or seated. 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 Vestibular testing was ordered.   Her symptoms are the same as 1 month ago. She is taking sertraline  daily, 75 mg. This was initially rx'd by her PCP but adjusted by neurology. Patient states she does not have follow up with neurology any longer. Sertraline  hasn't helped her dizziness. She has been working with PT but hasn't seen PT the past 1 month.   I reviewed her brain MRI images from 11/25/2019 and images were negative for retrocochlear pathology.   Past Medical History, Surgical History, Medications, Allergies, Social History:  Past medical, surgical, social history including medications and allergies are reviewed.   Review of Systems Complete ROS obtained by nursing staff and reviewed by me. Pertinent positives and negatives listed in nursing staff progress note.   Vestibular Testing 04/18/2020:  VNG - abnormal. There is a very fine down-beating nystagmus present throughout testing that gets bigger gazing down. Bithermal water caloric exam shows a 38% left unilateral weakness. Ocular motor subtests are normal. There is no BPPV. VHIT - normal Rotary chair - normal   Assessment: Abnormal vestibular exam. 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 The patient shows normal vHIT and normal results in the rotary chair indicating this asymmetry is compensated on this date. She does have a very fine down-beating nystagmus throughout testing today and this is suggestive of CNS etiology if the  effects of her medication can be ruled out.   Objective:   BP 132/88   Pulse 69   LMP  (LMP Unknown)    General:  General appearance of this patient is normal with regard to development, nutrition, attention to grooming, and lack of deformities. Patient's ability to communicate is normal. No acute distress.   Head and Face:  The overall appearance of the head and face is normal.   Facial strength is normal.  Eyes: Conjunctiva appears normal bilaterally.  External Ears:  Assessment of hearing by clinical speech reception is normal.   External inspection of the ears is normal with no evidence of otorrhea, erythema, edema, lesions, or scars.  Neck:  Overall appearance of the neck is normal and symmetric with normal tracheal position and no crepitus.    Respiratory: There were no visible intercostal retractions or evidence of the use of accessory muscles of respiration.  Neurologic and Psychiatric: The patient was oriented to person, place, and time.  The patient's mood and affect appear normal.  Skin: No lesions or masses noted on the dermis of the head, face, and neck.   Diagnosis for this encounter:      ICD-10-CM   1. Dizziness  R42 Ambulatory Referral to Psychiatry   Discussion and Plan:    1. I reviewed today's vestibular testing results with the patient. 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 We discussed these findings. Her work with PT has likely led to her compensation. She can continue to work with PT at her discretion but, d/t compensation, it is not absolutely necessary from a vestibular perspective. Her current dizzy symptoms are not d/t  an otologic etiology.   2. 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 I advised her to follow up with her PCP regarding SSRI management. Since sertraline  has not helped her I would recommend tapering off of that medication and trying an alternative, such as  escitalopram or fluoxetine. I advised patient to discuss taper and alternate medication with her PCP.  3. We discussed referral to psychiatry for PPPD and patient wished to proceed. I placed the order.   Follow up in my clinic PRN.  The diagnoses and treatment plan were reviewed with the patient. There were no barriers to understanding. The patient verbalized understanding and agreement with the plan.  Time and Medical Decision Making:   I spent a total of 25 minutes in both face-to-face and non-face-to-face activities for this visit on the date of this encounter.  Decision for Billing Based on Time:  Established 99213: 20-29 minutes  Decision for Billing Based on Medical Decision Making (Needs 2 out of 3): Decision  Made Based on Time  I personally performed the service, non-incident to. [facility])   BENJAMIN DENT [place], PA Cheyenne Bean. L[place], PA-C

## 2020-04-25 ENCOUNTER — Other Ambulatory Visit: Payer: Self-pay | Admitting: Family Medicine

## 2020-04-25 DIAGNOSIS — F324 Major depressive disorder, single episode, in partial remission: Secondary | ICD-10-CM

## 2020-04-25 DIAGNOSIS — F419 Anxiety disorder, unspecified: Secondary | ICD-10-CM

## 2020-04-25 MED ORDER — SERTRALINE HCL 50 MG PO TABS: 50.0000 mg | ORAL_TABLET | Freq: Every day | ORAL | 1 refills | Status: DC

## 2020-04-25 NOTE — Telephone Encounter (Signed)
Medication Refill - Medication: sertraline (ZOLOFT) 50 MG tablet   Preferred Pharmacy (with phone number or street name):  [id number] - [place], [place] - [address] Phone:  [phone]  Fax:  [phone]       Agent: Please be advised that RX refills may take up to 3 business days. We ask that you follow-up with your pharmacy.

## 2020-04-27 ENCOUNTER — Ambulatory Visit: Payer: BC Managed Care – PPO

## 2020-05-04 ENCOUNTER — Ambulatory Visit: Payer: BC Managed Care – PPO

## 2020-07-04 ENCOUNTER — Other Ambulatory Visit: Payer: Self-pay

## 2020-07-04 ENCOUNTER — Ambulatory Visit
Admission: RE | Admit: 2020-07-04 | Discharge: 2020-07-04 | Disposition: A | Discharge status: Home / Self Care | Payer: BC Managed Care – PPO | Source: Ambulatory Visit | Attending: Obstetrics and Gynecology | Admitting: Obstetrics and Gynecology

## 2020-07-04 DIAGNOSIS — Z1231 Encounter for screening mammogram for malignant neoplasm of breast: Secondary | ICD-10-CM | POA: Insufficient documentation

## 2020-07-04 DIAGNOSIS — Z1239 Encounter for other screening for malignant neoplasm of breast: Secondary | ICD-10-CM

## 2020-07-06 ENCOUNTER — Other Ambulatory Visit: Payer: Self-pay | Admitting: Obstetrics and Gynecology

## 2020-07-06 DIAGNOSIS — R928 Other abnormal and inconclusive findings on diagnostic imaging of breast: Secondary | ICD-10-CM

## 2020-07-06 DIAGNOSIS — N6320 Unspecified lump in the left breast, unspecified quadrant: Secondary | ICD-10-CM

## 2020-07-13 ENCOUNTER — Ambulatory Visit
Admission: RE | Admit: 2020-07-13 | Discharge: 2020-07-13 | Disposition: A | Discharge status: Home / Self Care | Payer: BC Managed Care – PPO | Source: Ambulatory Visit | Attending: Obstetrics and Gynecology | Admitting: Obstetrics and Gynecology

## 2020-07-13 DIAGNOSIS — R928 Other abnormal and inconclusive findings on diagnostic imaging of breast: Secondary | ICD-10-CM | POA: Insufficient documentation

## 2020-07-13 DIAGNOSIS — N6320 Unspecified lump in the left breast, unspecified quadrant: Secondary | ICD-10-CM | POA: Diagnosis present

## 2020-11-06 ENCOUNTER — Other Ambulatory Visit: Payer: Self-pay | Admitting: Family Medicine

## 2020-11-06 DIAGNOSIS — A6000 Herpesviral infection of urogenital system, unspecified: Secondary | ICD-10-CM

## 2020-11-06 DIAGNOSIS — E7800 Pure hypercholesterolemia, unspecified: Secondary | ICD-10-CM

## 2020-11-06 NOTE — Telephone Encounter (Signed)
Requested Prescriptions  Pending Prescriptions Disp Refills  . pravastatin (PRAVACHOL) 20 MG tablet [Pharmacy Med Name: Pravastatin Sodium 20 MG Oral Tablet] 90 tablet 1    Sig: Take 1 tablet by mouth once daily     Cardiovascular:  Antilipid - Statins Failed - 11/06/2020  9:32 AM      Failed - LDL in normal range and within 360 days    LDL Chol Calc (NIH)  Date Value Ref Range Status  04/03/2020 112 (H) 0 - 99 mg/dL Final         Passed - Total Cholesterol in normal range and within 360 days    Cholesterol, Total  Date Value Ref Range Status  04/03/2020 170 100 - 199 mg/dL Final         Passed - HDL in normal range and within 360 days    HDL  Date Value Ref Range Status  04/03/2020 45 >39 mg/dL Final         Passed - Triglycerides in normal range and within 360 days    Triglycerides  Date Value Ref Range Status  04/03/2020 65 0 - 149 mg/dL Final         Passed - Patient is not pregnant      Passed - Valid encounter within last 12 months    Recent Outpatient Visits          7 months ago Essential hypertension   [facility] Duanne Limerick, MD   1 year ago Essential hypertension   [facility] Duanne Limerick, MD   2 years ago Major depressive disorder in partial remission, unspecified whether recurrent (HCC)   [facility] Duanne Limerick, MD   2 years ago Facial pain   [facility] Duanne Limerick, MD   2 years ago Flu vaccine need   [facility] Duanne Limerick, MD             . valACYclovir (VALTREX) 500 MG tablet [Pharmacy Med Name: valACYclovir HCl 500 MG Oral Tablet] 90 tablet 0    Sig: TAKE 1 TABLET BY MOUTH ONCE DAILY. APPOINTMENT NEEDED     Antimicrobials:  Antiviral Agents - Anti-Herpetic Passed - 11/06/2020  9:32 AM      Passed - Valid encounter within last 12 months    Recent Outpatient Visits          7 months ago Essential hypertension   [facility] Duanne Limerick, MD   1 year ago  Essential hypertension   [facility] Duanne Limerick, MD   2 years ago Major depressive disorder in partial remission, unspecified whether recurrent [facility])   [facility] Duanne Limerick, MD   2 years ago Facial pain   [facility] Duanne Limerick, MD   2 years ago Flu vaccine need   [facility] Duanne Limerick, MD

## 2020-11-20 ENCOUNTER — Telehealth: Payer: Self-pay

## 2020-11-20 NOTE — Telephone Encounter (Signed)
Since Dr. Yetta Barre is out of the office this week it will be best for patient to go to urgent care to be evaluated for gout and have urgent labs done. If she does not wish to do this, she can make an appointment the Dr Yetta Barre for next week for possible gout. Please call patient.

## 2020-11-20 NOTE — Telephone Encounter (Signed)
Copied from CRM [id number]. Topic: General - Other >> Nov 20, 2020  9:45 AM Dalphine Handing A wrote: Patient wants to get a lab order placed to find out if she has gout that is causing her foot pain. Please advise

## 2020-11-21 ENCOUNTER — Ambulatory Visit: Payer: Medicaid Other

## 2020-11-28 ENCOUNTER — Ambulatory Visit: Payer: Medicaid Other | Admitting: Family Medicine

## 2020-11-28 ENCOUNTER — Encounter: Payer: Self-pay | Admitting: Family Medicine

## 2020-11-28 ENCOUNTER — Other Ambulatory Visit: Payer: Self-pay

## 2020-11-28 VITALS — BP 140/100 | HR 80 | Ht 65.0 in | Wt 159.0 lb

## 2020-11-28 DIAGNOSIS — Z23 Encounter for immunization: Secondary | ICD-10-CM

## 2020-11-28 DIAGNOSIS — I10 Essential (primary) hypertension: Secondary | ICD-10-CM | POA: Diagnosis not present

## 2020-11-28 DIAGNOSIS — S86111A Strain of other muscle(s) and tendon(s) of posterior muscle group at lower leg level, right leg, initial encounter: Secondary | ICD-10-CM

## 2020-11-28 NOTE — Progress Notes (Signed)
Date:  11/28/2020   Name:  Cheyenne Bean   DOB:  [birth year]   MRN:  [id number]   Chief Complaint: Leg Pain (R) leg from knee down to ankle- hurts and is swollen) and Flu Vaccine  Leg Pain  The incident occurred 5 to 7 days ago. There was no injury mechanism. The pain is present in the right knee (right calf). The pain is mild. Pertinent negatives include no inability to bear weight, loss of motion, loss of sensation, muscle weakness, numbness or tingling. The symptoms are aggravated by movement.    Lab Results  Component Value Date   CREATININE 0.75 04/03/2020   BUN 21 04/03/2020   NA 142 04/03/2020   K 4.1 04/03/2020   CL 104 04/03/2020   CO2 27 04/03/2020   Lab Results  Component Value Date   CHOL 170 04/03/2020   HDL 45 04/03/2020   LDLCALC 112 (H) 04/03/2020   TRIG 65 04/03/2020   CHOLHDL 3.2 03/26/2018   No results found for: TSH No results found for: HGBA1C No results found for: WBC, HGB, HCT, MCV, PLT Lab Results  Component Value Date   ALT 24 08/05/2019   AST 21 08/05/2019   ALKPHOS 101 08/05/2019   BILITOT 0.3 08/05/2019     Review of Systems  Constitutional: Negative.  Negative for chills, fatigue, fever and unexpected weight change.  HENT: Negative for congestion, ear discharge, ear pain, rhinorrhea, sinus pressure, sneezing and sore throat.   Eyes: Negative for photophobia, pain, discharge, redness and itching.  Respiratory: Negative for cough, shortness of breath, wheezing and stridor.   Gastrointestinal: Negative for abdominal pain, blood in stool, constipation, diarrhea, nausea and vomiting.  Endocrine: Negative for cold intolerance, heat intolerance, polydipsia, polyphagia and polyuria.  Genitourinary: Negative for dysuria, flank pain, frequency, hematuria, menstrual problem, pelvic pain, urgency, vaginal bleeding and vaginal discharge.  Musculoskeletal: Negative for arthralgias, back pain and myalgias.  Skin: Negative for rash.   Allergic/Immunologic: Negative for environmental allergies and food allergies.  Neurological: Negative for dizziness, tingling, weakness, light-headedness, numbness and headaches.  Hematological: Negative for adenopathy. Does not bruise/bleed easily.  Psychiatric/Behavioral: Negative for dysphoric mood. The patient is not nervous/anxious.     Patient Active Problem List   Diagnosis Date Noted  . Pure hypercholesterolemia 11/04/2016  . Essential hypertension 09/18/2015  . Hyperlipidemia 09/18/2015  . Recurrent genital herpes simplex 09/18/2015    No Known Allergies  Past Surgical History:  Procedure Laterality Date  . BACK SURGERY    . BUNIONECTOMY Left   . COLONOSCOPY  2012   cleared for 10 yrs  . COLONOSCOPY WITH PROPOFOL N/A 02/27/2017   Procedure: COLONOSCOPY WITH PROPOFOL;  Surgeon: Christena Deem, MD;  Location: [facility] ENDOSCOPY;  Service: Endoscopy;  Laterality: N/A;  . VAGINAL HYSTERECTOMY      Social History   Tobacco Use  . Smoking status: Never Smoker  . Smokeless tobacco: Never Used  Vaping Use  . Vaping Use: Never used  Substance Use Topics  . Alcohol use: No    Alcohol/week: 0.0 standard drinks  . Drug use: No     Medication list has been reviewed and updated.  Current Meds  Medication Sig  . amLODipine (NORVASC) 5 MG tablet Take 1 tablet by mouth daily. Gwen Pounds  . diclofenac sodium (VOLTAREN) 1 % GEL Apply 2 g topically 4 (four) times daily.  Marland Kitchen FLUoxetine (PROZAC) 40 MG capsule Take 1 capsule by mouth daily. Behavioral health  . meclizine (ANTIVERT) 25  MG tablet Take 1 tablet (25 mg total) by mouth 3 (three) times daily as needed for dizziness.  . meloxicam (MOBIC) 15 MG tablet Take 1 tablet by mouth daily. [place]  . pravastatin (PRAVACHOL) 20 MG tablet Take 1 tablet by mouth once daily  . valACYclovir (VALTREX) 500 MG tablet TAKE 1 TABLET BY MOUTH ONCE DAILY. APPOINTMENT NEEDED  . [DISCONTINUED] lisinopril-hydrochlorothiazide (ZESTORETIC) 20-25 MG  tablet Take 1 tablet by mouth daily.    PHQ 2/9 Scores 11/28/2020 04/03/2020 08/05/2019 10/15/2018  PHQ - 2 Score 0 2 0 1  PHQ- 9 Score 0 2 0 2    GAD 7 : Generalized Anxiety Score 11/28/2020 04/03/2020  Nervous, Anxious, on Edge 0 0  Control/stop worrying 0 0  Worry too much - different things 0 0  Trouble relaxing 0 0  Restless 0 0  Easily annoyed or irritable 0 0  Afraid - awful might happen 0 0  Total GAD 7 Score 0 0    BP Readings from Last 3 Encounters:  11/28/20 (!) 140/100  04/03/20 120/80  12/21/19 (!) 141/87    Physical Exam Vitals and nursing note reviewed.  Constitutional:      Appearance: She is well-developed.  HENT:     Head: Normocephalic.     Right Ear: External ear normal.     Left Ear: External ear normal.  Eyes:     General: Lids are everted, no foreign bodies appreciated. No scleral icterus.       Left eye: No foreign body or hordeolum.     Conjunctiva/sclera: Conjunctivae normal.     Right eye: Right conjunctiva is not injected.     Left eye: Left conjunctiva is not injected.     Pupils: Pupils are equal, round, and reactive to light.  Neck:     Thyroid: No thyromegaly.     Vascular: No JVD.     Trachea: No tracheal deviation.  Cardiovascular:     Rate and Rhythm: Normal rate and regular rhythm.     Heart sounds: Normal heart sounds. No murmur heard.  No friction rub. No gallop.   Pulmonary:     Effort: Pulmonary effort is normal. No respiratory distress.     Breath sounds: Normal breath sounds. No wheezing or rales.  Abdominal:     General: Bowel sounds are normal.     Palpations: Abdomen is soft. There is no mass.     Tenderness: There is no abdominal tenderness. There is no guarding or rebound.  Musculoskeletal:        General: Normal range of motion.     Cervical back: Normal range of motion and neck supple.     Right upper leg: Tenderness present.     Right knee: No swelling, effusion or bony tenderness. No tenderness. Normal alignment.      Right lower leg: Swelling and tenderness present. No deformity or bony tenderness. No edema.     Comments: Vastus medialis mild tenderness  Lymphadenopathy:     Cervical: No cervical adenopathy.  Skin:    General: Skin is warm.     Findings: No rash.  Neurological:     Mental Status: She is alert and oriented to person, place, and time.     Cranial Nerves: No cranial nerve deficit.     Deep Tendon Reflexes: Reflexes normal.  Psychiatric:        Mood and Affect: Mood is not anxious or depressed.     Wt Readings from Last 3 Encounters:  11/28/20 159 lb (72.1 kg)  04/03/20 162 lb (73.5 kg)  12/21/19 162 lb (73.5 kg)    BP (!) 140/100   Pulse 80   Ht 5\' 5"  (1.651 m)   Wt 159 lb (72.1 kg)   BMI 26.46 kg/m   Assessment and Plan: 1. Gastrocnemius strain, right, initial encounter New onset.  Persistent for 1 week.  Stable.  Exam notes that the gastroc has very mild tenderness with no swelling of a significant degree.  Patient has tenderness over the medial insertion of the quadricep.  This is consistent with likely a mild first-degree strain of the gastroc.  Patient has meloxicam but has not started it as of yet and I have encouraged her to do so to see if this will help with the strain.  Patient is also been instructed to use Tylenol if needs any further pain management during the course of the day.  This is inconsistent exam for the patient is concerned that there could be a blood clot but there is no ' sign nor significant swelling.  2. Essential hypertension Chronic.  Uncontrolled.  Followed by Dr. Denna Haggard at [facility] heart.  Seen that they have just been seen we called and spoke with the nurse and she is going to call to make a change on her medication for the blood pressure being at 140/100.  3. Need for immunization against influenza Discussed and administered. - Flu Vaccine QUAD 36+ mos IM

## 2020-11-28 NOTE — Patient Instructions (Signed)
Medial Head Gastrocnemius Tear  Medial head gastrocnemius tear, also called tennis leg, is an injury to the inner part of the calf muscle. This injury may include overstretching of the muscle or a partial or complete tear. This is a common sports injury. Calf muscle tears usually occur near the back of the knee. This often causes sudden pain and muscle weakness. What are the causes? This condition is caused by forceful stretching or strain on the calf muscle. This usually happens when you forcefully push off of your foot. It may also happen if you forcefully straighten your knee while your foot is flat on the ground. What increases the risk? The following factors may make you more likely to develop this condition:  Being female and older than age 40.  Playing sports that involve: ? Quick increases in speed and changes of direction, such as tennis and soccer. ? Jumping, such as basketball. ? Running, especially uphill or on uneven ground. What are the signs or symptoms? Symptoms of this condition include:  Sudden pain in the back of the leg. You may hear a noise, like a pop or a snap at the time of injury.  Pain that gets worse when you bring your toes up toward your shin or when you straighten your knee.  Pain on the inside of your calf, from your knee to your ankle.  Pain when pressing on your calf muscle.  Swelling and bruising along your calf and lower leg, down to your ankle. This may worsen for the first 2 days before getting better.  Not being able to rise up on your toes.  Difficulty pushing off your foot when walking or using stairs. How is this diagnosed? This condition may be diagnosed based on:  Your symptoms and medical history.  A physical exam. Your health care provider may be able to feel a lump or a defect in your muscle.  An MRI or ultrasound to determine the severity and exact location of your injury. How is this treated? Treatment for this condition may  include:  Resting the muscle and keeping weight off your leg for several days. During this time, you may use crutches or another walking device.  Using a splint to keep your ankle or knee in a stable position.  Wearing a walking boot to decrease the use of your gastrocnemius muscle.  Using a wedge under your heel to reduce stretching of your healing muscle.  Wearing a compression sleeve around your calf muscle.  Icing the muscle.  Raising (elevating) your leg when resting.  Taking medicine for pain and swelling, such as NSAIDs or steroids.  Taking medicine for muscle spasms.  Doing leg exercises as told by your health care provider or physical therapist. Follow these instructions at home: Medicines  Take over-the-counter and prescription medicines only as told by your health care provider.  Ask your health care provider if the medicine prescribed to you requires you to avoid driving or using heavy machinery.  Talk with your health care provider before you take any medicines that contain aspirin. Aspirin increases your risk for bleeding at the injured area. If you have a splint, boot, or compression sleeve:  Wear it as told by your health care provider. Remove it only as told by your health care provider.  Loosen the splint, boot, or sleeve if your toes tingle, become numb, or turn cold and blue.  Keep the splint, boot, or sleeve clean.  If the splint, boot, or sleeve is not   waterproof: ? Do not let it get wet. ? Cover it with a watertight covering when you take a bath or shower. Managing pain, stiffness, and swelling   If directed, put ice on the injured area. ? If you have a removable splint, boot, or sleeve, remove it as told by your health care provider. ? Put ice in a plastic bag. ? Place a towel between your skin and the bag. ? Leave the ice on for 20 minutes, 2-3 times a day.  Move your toes often to reduce stiffness and swelling.  Elevate the injured area  above the level of your heart while you are sitting or lying down. Activity  Return gradually to your normal activities as told by your health care provider. Ask your health care provider what activities are safe for you.  Do not use the injured limb to support your body weight until your health care provider says that you can. Use crutches as told by your health care provider.  Do exercises as told by your health care provider.  Return to sporting activity only as told by your health care provider or physical therapist. Full recovery may take several months. General instructions  Ask your health care provider when it is safe to drive.  Do not use any products that contain nicotine or tobacco, such as cigarettes, e-cigarettes, and chewing tobacco. If you need help quitting, ask your health care provider.  Keep all follow-up visits as told by your health care provider. This is important. How is this prevented?  Warm up and stretch before being active.  Cool down and stretch after being active.  Give your body time to rest between periods of activity.  Make sure to use equipment that fits you.  Be safe and responsible while being active to avoid falls.  Maintain physical fitness, including: ? Strength. ? Flexibility. Contact a health care provider if:  Your symptoms do not improve with rest and treatment. Get help right away if:  You have swelling or redness in your calf that is getting worse.  Your skin or toenails turn blue or gray, feel cold, or become numb. Summary  Medial head gastrocnemius tear, also called tennis leg, is an injury to the inner part of the calf muscle.  Follow instructions as told by your health care provider for resting, icing, compressing, and elevating your leg.  Take over-the-counter and prescription medicines only as told by your health care provider.  Contact a health care provider if your symptoms do not improve with rest and  treatment. This information is not intended to replace advice given to you by your health care provider. Make sure you discuss any questions you have with your health care provider. Document Revised: 11/03/2018 Document Reviewed: 11/03/2018 Elsevier Patient Education  2020 [employer]  

## 2020-12-13 ENCOUNTER — Ambulatory Visit
Admission: EM | Admit: 2020-12-13 | Discharge: 2020-12-13 | Disposition: A | Discharge status: Home / Self Care | Payer: BC Managed Care – PPO | Attending: Emergency Medicine | Admitting: Emergency Medicine

## 2020-12-13 ENCOUNTER — Ambulatory Visit (INDEPENDENT_AMBULATORY_CARE_PROVIDER_SITE_OTHER): Payer: BC Managed Care – PPO

## 2020-12-13 ENCOUNTER — Other Ambulatory Visit: Payer: Self-pay

## 2020-12-13 DIAGNOSIS — R079 Chest pain, unspecified: Secondary | ICD-10-CM | POA: Diagnosis present

## 2020-12-13 LAB — BASIC METABOLIC PANEL
Anion gap: 7 (ref 5–15)
BUN: 18 mg/dL (ref 8–23)
CO2: 28 mmol/L (ref 22–32)
Calcium: 9.5 mg/dL (ref 8.9–10.3)
Chloride: 104 mmol/L (ref 98–111)
Creatinine, Ser: 0.8 mg/dL (ref 0.44–1.00)
GFR, Estimated: >60 mL/min (ref >60)
Glucose, Bld: 110 mg/dL — ABNORMAL HIGH (ref 70–99)
Potassium: 3.7 mmol/L (ref 3.5–5.1)
Sodium: 139 mmol/L (ref 135–145)

## 2020-12-13 LAB — CBC WITH DIFFERENTIAL/PLATELET
Abs Immature Granulocytes: 0.01 10*3/uL (ref 0.00–0.07)
Basophils Absolute: 0 10*3/uL (ref 0.0–0.1)
Basophils Relative: 0 %
Eosinophils Absolute: 0.1 10*3/uL (ref 0.0–0.5)
Eosinophils Relative: 1 %
HCT: 43.3 % (ref 36.0–46.0)
Hemoglobin: 14.4 g/dL (ref 12.0–15.0)
Immature Granulocytes: 0 %
Lymphocytes Relative: 20 %
Lymphs Abs: 1.1 10*3/uL (ref 0.7–4.0)
MCH: 29 pg (ref 26.0–34.0)
MCHC: 33.3 g/dL (ref 30.0–36.0)
MCV: 87.1 fL (ref 80.0–100.0)
Monocytes Absolute: 0.6 10*3/uL (ref 0.1–1.0)
Monocytes Relative: 11 %
Neutro Abs: 3.7 10*3/uL (ref 1.7–7.7)
Neutrophils Relative %: 68 %
Platelets: 199 10*3/uL (ref 150–400)
RBC: 4.97 MIL/uL (ref 3.87–5.11)
RDW: 13.6 % (ref 11.5–15.5)
WBC: 5.5 10*3/uL (ref 4.0–10.5)
nRBC: 0 % (ref 0.0–0.2)

## 2020-12-13 LAB — TROPONIN I (HIGH SENSITIVITY): Troponin I (High Sensitivity): <2 ng/L (ref <18)

## 2020-12-13 MED ORDER — FAMOTIDINE 20 MG PO TABS
20.0000 mg | ORAL_TABLET | Freq: Two times a day (BID) | ORAL | 0 refills | Status: DC
Start: 2020-12-13 — End: 2021-04-19

## 2020-12-13 MED ORDER — ALUM & MAG HYDROXIDE-SIMETH 200-200-20 MG/5ML PO SUSP
30.0000 mL | Freq: Once | ORAL | Status: AC
  Administered 2020-12-13: 14:00:00 30 mL via ORAL

## 2020-12-13 MED ORDER — LIDOCAINE VISCOUS HCL 2 % MT SOLN
15.0000 mL | Freq: Once | OROMUCOSAL | Status: AC
  Administered 2020-12-13: 14:00:00 15 mL via ORAL

## 2020-12-13 NOTE — Discharge Instructions (Addendum)
Try the Pepcid.  This could be acid reflux.  It does not appear to be heart attack, lung infection, collapsed lung, aortic dissection, or other serious cause at this time.  However, it could be due to fluid around your heart which cannot be diagnosed without imaging that I do not have access to here.  The likelihood of this is fairly low as this is very rare.  Follow-up with your doctor in 2 or 3 days if not getting any better, to the ER for fevers, shortness of breath, chest pain or pressure, radiation of this pain up your neck, down your arm, through to your back, passing out, nausea or sweatiness with this pain, if he gets worse with walking around, or for any concerns.

## 2020-12-13 NOTE — ED Triage Notes (Signed)
Pt states last night she was having a lot of gas. Today woke up at 8:00 with left sided chest pain intermittently. Worse when she bends over. Pain is non-radiating

## 2020-12-13 NOTE — ED Provider Notes (Signed)
HPI  SUBJECTIVE:  Cheyenne Bean is a 64 y.o. female who presents with constant left-sided dull, throbbing chest pain starting at 8:00 this morning.  She denies chest pressure, heaviness.  No nausea , diaphoresis, radiation up her neck, down her arm or through to her back.  She had a COVID booster yesterday.  No coughing, wheezing, shortness of breath.  No abdominal pain, palpitations.  No recent viral illness.  No change in her physical activity or trauma to the chest.  She states she gets worse when she bends forward, there is no exertional component.  No calf pain, swelling, no surgery in the past 4 weeks, recent immobilization, hemoptysis, exogenous estrogen.  She tried Rolaids without improvement in her symptoms.  It is not associated with lying down, torso rotation, or movement.  She has a past medical history of hypertension, hypercholesterolemia, well controlled sarcoidosis.  She is not a smoker, no history of asthma, emphysema, COPD, DVT, PE, cancer, hypercoagulability, MI, angina, coronary artery disease.  Family history negative for early MI.  WHQ:PRFFM, Deanna C, MD   Past Medical History:  Diagnosis Date  . BRCA negative 01/2017   MyRisk neg  . Family history of breast cancer    1/18 IBIS=10.7%  . Family history of ovarian cancer    MyRisk neg  . Genital herpes   . Hyperlipidemia   . Hypertension   . IBS (irritable bowel syndrome) 02/20/2017  . Sarcoidosis     Past Surgical History:  Procedure Laterality Date  . BACK SURGERY    . BUNIONECTOMY Left   . COLONOSCOPY  2012   cleared for 10 yrs  . COLONOSCOPY WITH PROPOFOL N/A 02/27/2017   Procedure: COLONOSCOPY WITH PROPOFOL;  Surgeon: Lollie Sails, MD;  Location: [facility] ENDOSCOPY;  Service: Endoscopy;  Laterality: N/A;  . VAGINAL HYSTERECTOMY      Family History  Problem Relation Age of Onset  . Hypertension Mother   . Breast cancer Sister 59  . Cervical cancer Sister 40  . Ovarian cancer Maternal Aunt 60  .  Colon cancer Maternal Uncle 16    Social History   Tobacco Use  . Smoking status: Never Smoker  . Smokeless tobacco: Never Used  Vaping Use  . Vaping Use: Never used  Substance Use Topics  . Alcohol use: No    Alcohol/week: 0.0 standard drinks  . Drug use: No    No current facility-administered medications for this encounter.  Current Outpatient Medications:  .  amLODipine (NORVASC) 5 MG tablet, Take 1 tablet by mouth daily. Nehemiah Massed, Disp: , Rfl:  .  diclofenac sodium (VOLTAREN) 1 % GEL, Apply 2 g topically 4 (four) times daily., Disp: 50 g, Rfl: 1 .  FLUoxetine (PROZAC) 40 MG capsule, Take 1 capsule by mouth daily. Behavioral health, Disp: , Rfl:  .  meclizine (ANTIVERT) 25 MG tablet, Take 1 tablet (25 mg total) by mouth 3 (three) times daily as needed for dizziness., Disp: 30 tablet, Rfl: 0 .  meloxicam (MOBIC) 15 MG tablet, Take 1 tablet by mouth daily. Mundy, Disp: , Rfl:  .  pravastatin (PRAVACHOL) 20 MG tablet, Take 1 tablet by mouth once daily, Disp: 90 tablet, Rfl: 1 .  valACYclovir (VALTREX) 500 MG tablet, TAKE 1 TABLET BY MOUTH ONCE DAILY. APPOINTMENT NEEDED, Disp: 90 tablet, Rfl: 0  No Known Allergies   ROS  As noted in HPI.   Physical Exam  BP (!) 150/84 (BP Location: Right Arm)   Pulse 90   Temp  99.3 F (37.4 C) (Oral)   Resp 17   Ht '5\' 4"'  (1.626 m)   Wt 72.6 kg   SpO2 97%   BMI 27.46 kg/m   Constitutional: Well developed, well nourished, no acute distress Eyes:  EOMI, conjunctiva normal bilaterally HENT: Normocephalic, atraumatic,mucus membranes moist Respiratory: Normal inspiratory effort, lungs clear bilaterally Cardiovascular: Normal rate, regular rhythm no murmurs rubs or gallops. No anterior or lateral chest wall tenderness. Chest pain is not reproduced with torso rotation, arm movement. RP, PT 2+ and equal. GI: nondistended skin: No rash, skin intact Musculoskeletal: Calves symmetric, nontender, no edema. Neurologic: Alert & oriented x 3, no  focal neuro deficits Psychiatric: Speech and behavior appropriate   ED Course   Medications  alum & mag hydroxide-simeth (MAALOX/MYLANTA) 200-200-20 MG/5ML suspension 30 mL (30 mLs Oral Given 12/13/20 1416)    And  lidocaine (XYLOCAINE) 2 % viscous mouth solution 15 mL (15 mLs Oral Given 12/13/20 1416)    Orders Placed This Encounter  Procedures  . DG Chest 2 View    Standing Status:   Standing    Number of Occurrences:   1    Order Specific Question:   Reason for Exam (SYMPTOM  OR DIAGNOSIS REQUIRED)    Answer:   left sided CP  . Basic metabolic panel    Standing Status:   Standing    Number of Occurrences:   1  . CBC with Differential    Standing Status:   Standing    Number of Occurrences:   1  . ED EKG    Standing Status:   Standing    Number of Occurrences:   1    Order Specific Question:   Reason for Exam    Answer:   Chest Pain  . EKG 12-Lead    Standing Status:   Standing    Number of Occurrences:   1  . EKG 12-Lead    Standing Status:   Standing    Number of Occurrences:   1  . EKG 12-Lead    Standing Status:   Standing    Number of Occurrences:   1    Results for orders placed or performed during the hospital encounter of 12/13/20 (from the past 24 hour(s))  CBC with Differential     Status: None   Collection Time: 12/13/20  2:17 PM  Result Value Ref Range   WBC 5.5 4.0 - 10.5 K/uL   RBC 4.97 3.87 - 5.11 MIL/uL   Hemoglobin 14.4 12.0 - 15.0 g/dL   HCT 43.3 36.0 - 46.0 %   MCV 87.1 80.0 - 100.0 fL   MCH 29.0 26.0 - 34.0 pg   MCHC 33.3 30.0 - 36.0 g/dL   RDW 13.6 11.5 - 15.5 %   Platelets 199 150 - 400 K/uL   nRBC 0.0 0.0 - 0.2 %   Neutrophils Relative % 68 %   Neutro Abs 3.7 1.7 - 7.7 K/uL   Lymphocytes Relative 20 %   Lymphs Abs 1.1 0.7 - 4.0 K/uL   Monocytes Relative 11 %   Monocytes Absolute 0.6 0.1 - 1.0 K/uL   Eosinophils Relative 1 %   Eosinophils Absolute 0.1 0.0 - 0.5 K/uL   Basophils Relative 0 %   Basophils Absolute 0.0 0.0 - 0.1  K/uL   Immature Granulocytes 0 %   Abs Immature Granulocytes 0.01 0.00 - 0.07 K/uL   DG Chest 2 View  Result Date: 12/13/2020 CLINICAL DATA:  Chest pain EXAM: CHEST -  2 VIEW COMPARISON:  June 11, 2018 FINDINGS: Lungs are clear. Heart size and pulmonary vascularity are normal. No adenopathy. No pneumothorax. There is degenerative change in the thoracic spine. IMPRESSION: Lungs clear.  Cardiac silhouette normal. Electronically Signed   By: Lowella Grip III M.D.   On: 12/13/2020 14:42    ED Clinical Impression  No diagnosis found.   ED Assessment/Plan   EKG: Normal sinus rhythm, rate 93.  Normal axis, normal intervals.  No hypertrophy.  No ST-T wave changes.  No change from prior EKG in July 2007.  Patient was symptomatic while EKG was obtained.  Patient does not meet PERC criteria because of age, but she is not tachycardic, hypoxic.  She has no pleuritic component to this.  In the differential is myocarditis due to the recent Covid booster, she does not fit that demographic.  Think that pericardial effusion is less likely, but still considered because it gets worse with leaning forward.  No evidence of pericarditis on EKG or physical exam.  Discussed sending her to the ED for serial troponins and EKGs, but discussed with patient that I could do a single troponin since it has been 5-1/2 hours since onset of symptoms, chest x-ray, BMP CBC here, and she has opted to stay here for a work-up.  I think this is reasonable as her HEAR score is 3.  Discussed that her risk of major cardiac events in the next 30 days is 0.9 to 1.7%.  Will also try GI cocktail.  Reviewed imaging independently.  Normal chest x-ray see radiology report for full details.  EKG, chest x-ray reassuring.  Her troponin is negative. HEART score 3. Her CBC and BMP are both also normal.  She states that she feels significantly better after the GI cocktail.  Will Try sending her home on some Pepcid.  Follow-up with PMD if not  getting any better.  Strict ER return precautions given.  Discussed labs, imaging, MDM, treatment plan, and plan for follow-up with patient. Discussed sn/sx that should prompt return to the ED. patient agrees with plan.   Meds ordered this encounter  Medications  . AND Linked Order Group   . alum & mag hydroxide-simeth (MAALOX/MYLANTA) 200-200-20 MG/5ML suspension 30 mL   . lidocaine (XYLOCAINE) 2 % viscous mouth solution 15 mL    *This clinic note was created using Lobby[employer]. Therefore, there may be occasional mistakes despite careful proofreading.   ?    Melynda Ripple, MD 12/13/20 1535

## 2021-01-15 ENCOUNTER — Other Ambulatory Visit: Payer: Self-pay

## 2021-01-15 ENCOUNTER — Ambulatory Visit: Payer: Medicaid Other | Attending: Orthopaedic Surgery

## 2021-01-15 DIAGNOSIS — R2681 Unsteadiness on feet: Secondary | ICD-10-CM | POA: Diagnosis present

## 2021-01-15 DIAGNOSIS — M545 Low back pain, unspecified: Secondary | ICD-10-CM | POA: Insufficient documentation

## 2021-01-15 DIAGNOSIS — G8929 Other chronic pain: Secondary | ICD-10-CM | POA: Diagnosis present

## 2021-01-15 DIAGNOSIS — M79672 Pain in left foot: Secondary | ICD-10-CM | POA: Insufficient documentation

## 2021-01-15 DIAGNOSIS — R42 Dizziness and giddiness: Secondary | ICD-10-CM | POA: Diagnosis present

## 2021-01-15 NOTE — Therapy (Signed)
Cheyenne Bean [address]. [place], [place], [address] Phone: [phone]   Fax:  [phone]  Physical Therapy Evaluation  Patient Details  Name: Cheyenne Bean MRN: [id number] Date of Birth: 07-28-1956 Referring Provider (PT): Dr. Gardiner Fanti   Encounter Date: 01/15/2021   PT End of Session - 01/15/21 1559    Visit Number 1    Number of Visits 17    Date for PT Re-Evaluation 03/12/21    Authorization Type eval: 01/15/21, FOTO predicted visit count 14    PT Start Time 1110    PT Stop Time 1155    PT Time Calculation (min) 45 min    Activity Tolerance Patient tolerated treatment well;Patient limited by pain    Behavior During Therapy [facility] for tasks assessed/performed           Past Medical History:  Diagnosis Date   BRCA negative 01/2017   MyRisk neg   Family history of breast cancer    1/18 IBIS=10.7%   Family history of ovarian cancer    MyRisk neg   Genital herpes    Hyperlipidemia    Hypertension    IBS (irritable bowel syndrome) 02/20/2017   Sarcoidosis     Past Surgical History:  Procedure Laterality Date   BACK SURGERY     BUNIONECTOMY Left    COLONOSCOPY  2012   cleared for 10 yrs   COLONOSCOPY WITH PROPOFOL N/A 02/27/2017   Procedure: COLONOSCOPY WITH PROPOFOL;  Surgeon: Lollie Sails, MD;  Location: [facility] ENDOSCOPY;  Service: Endoscopy;  Laterality: N/A;   VAGINAL HYSTERECTOMY      There were no vitals filed for this visit.     Subjective Assessment - 01/15/21 1559    Subjective L foot pain    Pertinent History Pt reports that approximately 1 year ago she started having L foot pain. She was wearing a lot of flats around that time when the pain started started. The pain improved after approximately 1 week without any intervention. She was visiting her dad in October 2021 and her foot pain flared up significantly again. She was unable ot put weight on her foot due to the pain and had  to use crutches to help her walk. She went to the urgent care and was sent to general orthopedics who recommended orthotics and gave her two steroid shots on her medial foot near her L posterior tibial tendon. She reports improvement for 1-2 days and then the pain returned. After returning to [place] she saw general orthopedics who placed a medial heel wedge in her shoes. He also prescribed her Voltaren gel for topical anti-inflammatory use to be used up to 4 times/day. 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 The pain is located on the medial aspect of her L foot at the insertion on the medial navicular and she describes it as "achy." No other recent changes in medication or health. History of chronic back pain with LLE radicular pain to the calf. Pt reports she has had back surgery without improvement but does not recall what type of surgery she had. Pt has a history of sarcoidosis but states that she had a breathing test yesterday which was good.    Limitations Walking;Standing    How long can you stand comfortably? 5 minutes    Diagnostic tests MRI    Patient Stated Goals Improve foot pain so she can walk comfortably    Currently in Pain? Yes  Pain Score 0-No pain   Worst: 10/10, Best: 0/10   Pain Location Foot    Pain Orientation Left    Pain Descriptors / Indicators Aching    Pain Type Chronic pain    Pain Radiating Towards None    Pain Onset More than a month ago    Pain Frequency Intermittent    Aggravating Factors  walking, extended standing (5 minutes), stooping down, pain worsens as day progresses    Pain Relieving Factors sitting down improves pain, Meloxicam, a couple days relief from staroid shots    Effect of Pain on Daily Activities Limits patients ability to comfortably ambulate              [facility] PT Assessment - 01/15/21 1600      Assessment   Medical Diagnosis L pes planus, posterior tibial tenditis, lesion of plantar nerve, pain in L foot     Referring Provider (PT) Dr. Gardiner Fanti    Onset Date/Surgical Date 09/21/20   Approximate   Hand Dominance Right    Next MD Visit Not reported    Prior Therapy None for this issue      Precautions   Precautions None      Restrictions   Weight Bearing Restrictions No      Balance Screen   Has the patient fallen in the past 6 months No    Has the patient had a decrease in activity level because of a fear of falling?  Yes    Is the patient reluctant to leave their home because of a fear of falling?  No      Home Ecologist residence      Prior Function   Level of Independence Independent with community mobility without device      Cognition   Overall Cognitive Status Within Functional Limits for tasks assessed            SUBJECTIVE Chief complaint: L foot pain History: Pt reports that approximately 1 year ago she started having L foot pain. She was wearing a lot of flats around that time when the pain started started. The pain improved after approximately 1 week without any intervention. She was visiting her dad in October 2021 and her foot pain flared up significantly again. She was unable ot put weight on her foot due to the pain and had to use crutches to help her walk. She went to the urgent care and was sent to general orthopedics who recommended orthotics and gave her two steroid shots on her medial foot near her L posterior tibial tendon. She reports improvement for 1-2 days and then the pain returned. After returning to [place] she saw general orthopedics who placed a medial heel wedge in her shoes. He also prescribed her Voltaren gel for topical anti-inflammatory use to be used up to 4 times/day. 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 The pain is located on the medial aspect of her L foot at the insertion on the medial navicular and she describes it as "achy." No other recent changes in medication or health. History of  chronic back pain with LLE radicular pain to the calf. Pt reports she has had back surgery without improvement but does not recall what type of surgery she had. Pt has a history of sarcoidosis but states that she had a breathing test yesterday which was good.   Pain location: L medial foot at insertion site of posterior tibial  tendon on medial navicular Pain: Present 0/10, Best 0/10, Worst 10/10 Pain quality: pain quality: aching Radiating pain: No  Numbness/Tingling: No 24 hour pain behavior: worsens as day progresses Aggravating factors: walking, extended standing (5 minutes), stooping down Easing factors: sitting down improves pain, Meloxicam, a couple days relief from staroid shots History of back, hip, or knee pain: Yes, history of chronic back pain for the last 10 years.  Precautions/WB Restrictions: No Next MD Visit: As needed per patient report Dominant hand: right Imaging: Yes plain film radiographs and MRI Occupational demands: Pt has been out of work for a year Hobbies: Pt likes to perform bead work to make jewelry  Goals: Decrease pain Typical footwear: She has been wearing HOKA shoes since October 2021, no change since wearing new shoes Red Flags (fever/chills, dysarthria, dysphagia, bowel and bladder changes, recent weight loss/gain, personal history of cancer, night pain): No   OBJECTIVE  MUSCULOSKELETAL: Tremor: Absent Bulk: Normal Tone: Normal, No trophic changes noted to foot/ankle. No ecchymosis, erythema, or edema noted. No gross L ankle/foot deformity noted. She does have some mild discoloration of L foot near site of steroid injection  Lumbar/Hip/Knee Screen Deferred  Posture No gross abnormalities noted in seated posture. In sitting and standing pt with notable preservation of arch. She does tend to keep weight more on the lateral aspect of her L foot to minimize pain.  Gait Only mild pronation noted during gait bilaterally however not excessive and pt is  able to maintain her arch. Somewhat difficult to assess due to patient limiting weight shift to the medial aspect of the left foot to avoid pain.  Palpation Pain to palpation at the insertion of the posterior tibial tendon on the navicular. She denies any pain with palpation proximally along the rest of the L posterior tibial tendon or anywhere further up the leg. No pain to palpation along medial and lateral malleoli. No pain with palpation of the rest of the midfoot, forefoot, or toes. Achilles tendon intact and painless to palpation  Strength R/L 5/5 Hip flexion 5/5 Hip abduction (seated) 5/5 Hip adduction (seated) 5/5 Knee extension 4/4 Knee flexion (seated) 5/5* Ankle Plantarflexion 5/5 Ankle Dorsiflexion 5/5* Ankle Inversion 5/5 Ankle Eversion  AROM R/L 60/53* Ankle Plantarflexion 35/30 Ankle Dorsiflexion 18/24 Ankle Inversion 18/12* Ankle Eversion *Indicates Pain Pain with passive L ankle DF, PF, and eversion;  Passive Accessory Motion Inferior Tibiofibular Joint: WNL Talocrural Joint Distraction: WNL Talocrural Joint AP: WNL Talocrural Joint PA: WNL Subtalar medial to lateral: WNL Subtalar lateral to medial: WNL  NEUROLOGICAL:  Mental Status Patient is oriented to person, place and time.  Recent memory is intact.  Remote memory is intact.  Attention span and concentration are intact.  Expressive speech is intact.  Patient's fund of knowledge is within normal limits for educational level.  Sensation Deferred  Reflexes Deferred  VASCULAR Dorsalis pedis and posterior tibial pulses are palpable bilaterally   SPECIAL TESTS  Ligamentous Integrity Anterior Drawer (ATF, 10-15 plantarflexion with anterior translation): Negative Talar Tilt (CFL, inversion): Negative Eversion Stress Test (Deltoid, eversion): Negative External Rotation Test (High ankle, dorsiflexion and external rotation): Not examined Squeeze Test (High ankle): Negative Impingment Sign  (Dorsiflexion and eversion): Negative  Achilles Integrity Thompson Test: Negative  Fracture Screening Metatarsal Axial Loading: Negative Tap/Percussion Test: Not examined Vibration Test: Not examined  Pronation/Supination Navicular Drop: Not examined  Nerve Test Tarsal Tunnel Test (maximal DF, EV, toe ext with tapping over tarsal tunnel): Not done Test for Morton's Neuroma (compress  metatarsals and mobilize): Not done  Other Windlass Mechanism Test: Not examined           Objective measurements completed on examination: See above findings.               PT Education - 01/15/21 1636    Education Details Plan of care, ice massage    Person(s) Educated Patient    Methods Explanation;Handout    Comprehension Verbalized understanding            PT Short Term Goals - 01/15/21 1552      PT SHORT TERM GOAL #1   Title Pt will be independent with HEP in order to decrease foot pain and increase strength/ROM in order to improve pain-free function at home.    Time 4    Period Weeks    Status New    Target Date 02/12/21             PT Long Term Goals - 01/15/21 1553      PT LONG TERM GOAL #1   Title Pt will decrease worst pain as reported on NPRS by at least 3 points in order to demonstrate clinically significant reduction in foot pain.    Baseline 01/15/21: worst: 10/10    Time 8    Period Weeks    Status New    Target Date 03/12/21      PT LONG TERM GOAL #2   Title Pt will increase LEFS by at least 9 points in order to demonstrate significant improvement in lower extremity function.    Baseline 01/15/21: to be completed at next visit    Time 8    Period Weeks    Status New    Target Date 03/12/21      PT LONG TERM GOAL #3   Title Pt will improve painless L ankle active eversion to within 2 degrees of R side in order to demonstrate improved ankle mobility with less pain    Baseline 01/15/21: R/L 18/12 Ankle Eversion (L ankle painful)    Time 8     Period Weeks    Status New    Target Date 03/12/21      PT LONG TERM GOAL #4   Title Pt will improve FOTO to at least 50 in order to demonstrate significant improvement in L ankle function    Baseline 01/15/21: 26    Time 8    Period Weeks    Status New    Target Date 03/12/21                  Plan - 01/15/21 1550    Clinical Impression Statement Pt is a pleasant 65 year-old female referred for L posterior tibial tendonitis. PT examination reveals pain to palpation at the insertion of the posterior tibial tendon on the navicular.  She denies any pain with palpation proximally along the rest of the L posterior tibial tendon or anywhere further up the leg. She has pain with active left ankle inversion and passive left ankle eversion. She is lacking L ankle eversion active range of motion compared the right side.  Full left ankle strength in all directions with pain during resisted inversion and plantarflexion.  Normal mobility and no pain with passive accessory mobility testing of additional foot and ankle joints.  No signs of ligamentous instability in the ankle.  Patient encouraged to ice aggressively and follow-up for initiation of additional modalities for pain control as well as home exercise program for  strengthening and gentle stretching.  She will benefit from PT services to address deficits in L foot pain in order to return to full pain-free function at home.    Personal Factors and Comorbidities Time since onset of injury/illness/exacerbation;Past/Current Experience;Comorbidity 3+    Comorbidities HTN, hyperlipidemia, scarcoidosis, IBS    Examination-Activity Limitations Squat;Stand    Examination-Participation Restrictions Community Activity;Cleaning;Meal Prep    Stability/Clinical Decision Making Evolving/Moderate complexity    Clinical Decision Making Moderate    Rehab Potential Good    PT Frequency 2x / week    PT Duration 8 weeks    PT Treatment/Interventions Canalith  Repostioning;Gait training;Stair training;Therapeutic activities;Balance training;Therapeutic exercise;Neuromuscular re-education;Patient/family education;Vestibular;Functional mobility training;ADLs/Self Care Home Management;Aquatic Therapy;Biofeedback;Electrical Stimulation;Cryotherapy;Iontophoresis 79m/ml Dexamethasone;Moist Heat;Ultrasound;Traction;Manual techniques;Passive range of motion;Dry needling;Taping;Joint Manipulations;Other (comment)   Phonophoresis with 1% Diclofenac topical   PT Next Visit Plan Have pt complete LEFS, Assess ROM and strength of digits, Initiate HEP exercises/stretches, iontophoresis    PT Home Exercise Plan Access Code: [email or web address]   Consulted and Agree with Plan of Care Patient           Patient will benefit from skilled therapeutic intervention in order to improve the following deficits and impairments:  Pain,Abnormal gait,Difficulty walking  Visit Diagnosis: Pain in left foot     Problem List Patient Active Problem List   Diagnosis Date Noted   Pure hypercholesterolemia 11/04/2016   Essential hypertension 09/18/2015   Hyperlipidemia 09/18/2015   Recurrent genital herpes simplex 09/18/2015   JLyndel SafeHuprich PT, DPT, GCS  Cheyenne Bean 01/16/2021, 9:21 AM  [facility] [facility] [place] [address]Phone: [phone]  Fax:  [phone] Name: AShefali NgMRN: [id number] of Birth: 106-24-1957

## 2021-01-15 NOTE — Patient Instructions (Signed)
Access Code: [email or web address] URL: [email or web address] Date: 01/15/2021 Prepared by: Ria Comment  Patient Education Ice Massage

## 2021-01-17 ENCOUNTER — Ambulatory Visit: Payer: Medicaid Other

## 2021-01-21 ENCOUNTER — Ambulatory Visit: Payer: Medicaid Other

## 2021-01-21 ENCOUNTER — Other Ambulatory Visit: Payer: Self-pay

## 2021-01-21 DIAGNOSIS — R2681 Unsteadiness on feet: Secondary | ICD-10-CM

## 2021-01-21 DIAGNOSIS — M79672 Pain in left foot: Secondary | ICD-10-CM | POA: Diagnosis not present

## 2021-01-21 DIAGNOSIS — M545 Low back pain, unspecified: Secondary | ICD-10-CM

## 2021-01-21 DIAGNOSIS — R42 Dizziness and giddiness: Secondary | ICD-10-CM

## 2021-01-21 NOTE — Therapy (Signed)
[facility] [address] [place], [place], [address] Phone: [phone]   Fax:  [phone]  Physical Therapy Treatment  Patient Details  Name: Cheyenne Bean MRN: [id number] Date of Birth: Mar 05, 1956 Referring Provider (PT): Dr. Gardiner Fanti   Encounter Date: 01/21/2021   PT End of Session - 01/21/21 0858    Visit Number 2    Number of Visits 17    Date for PT Re-Evaluation 03/12/21    Authorization Type eval: 01/15/21, FOTO predicted visit count 14    PT Start Time 0847    PT Stop Time 0927    PT Time Calculation (min) 40 min    Activity Tolerance Patient tolerated treatment well;Patient limited by pain    Behavior During Therapy [facility] for tasks assessed/performed           Past Medical History:  Diagnosis Date  . BRCA negative 01/2017   MyRisk neg  . Family history of breast cancer    1/18 IBIS=10.7%  . Family history of ovarian cancer    MyRisk neg  . Genital herpes   . Hyperlipidemia   . Hypertension   . IBS (irritable bowel syndrome) 02/20/2017  . Sarcoidosis     Past Surgical History:  Procedure Laterality Date  . BACK SURGERY    . BUNIONECTOMY Left   . COLONOSCOPY  2012   cleared for 10 yrs  . COLONOSCOPY WITH PROPOFOL N/A 02/27/2017   Procedure: COLONOSCOPY WITH PROPOFOL;  Surgeon: Lollie Sails, MD;  Location: [facility] ENDOSCOPY;  Service: Endoscopy;  Laterality: N/A;  . VAGINAL HYSTERECTOMY      There were no vitals filed for this visit.   Subjective Assessment - 01/21/21 0849    Subjective Pt reports pain is quite awful right now, she was on her feet much last week, so her pain has been elevtaed (10/10 today). She has been trying to stay off her feet last 2 days, no significant improvement. Pt conitnues to use voltaren gel. She also continues to use ice, but feels it is not very helpful. Since eval, pt changed her orthotic set up, as her new insets did not seem to be as helpful as the first one she  was given in [place]- she now has both in her shoes. She now reports having some increased symptoms in the space posterior the medial malleolus adjacent the pathway of the tendon.    Pertinent History Pt reports that approximately 1 year ago she started having L foot pain. She was wearing a lot of flats around that time when the pain started started. The pain improved after approximately 1 week without any intervention. She was visiting her dad in October 2021 and her foot pain flared up significantly again. She was unable ot put weight on her foot due to the pain and had to use crutches to help her walk. She went to the urgent care and was sent to general orthopedics who recommended orthotics and gave her two steroid shots on her medial foot near her L posterior tibial tendon. She reports improvement for 1-2 days and then the pain returned. After returning to [place] she saw general orthopedics who placed a medial heel wedge in her shoes. He also prescribed her Voltaren gel for topical anti-inflammatory use to be used up to 4 times/day. 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 The pain is located on the medial aspect of her L foot at the insertion on the medial  navicular and she describes it as "achy." No other recent changes in medication or health. History of chronic back pain with LLE radicular pain to the calf. Pt reports she has had back surgery without improvement but does not recall what type of surgery she had. Pt has a history of sarcoidosis but states that she had a breathing test yesterday which was good.    Currently in Pain? Yes    Pain Score 10-Worst pain ever    Pain Location --   Left posterior tibialis tendon insertion.          INTERVENTION THIS DATE: -AROM Toes, hallux: all symmetrical, all limited by ~50% from norm values, some partial extension in hallux IP joints bilat, often 2/2 hallux rigidus type foot.  -MMT hallux extension, toe extension 5/5  bilat  -education on symptoms management, use of AD, especially with up coming travel avoid  -SLS foot/ankle righting, motor control assessment: mild hypermobility of first ray dorsiflexion with failure upon loading, insufficient strength to weight shift back to midline of foot, resultant LOB May benefit from mild first ray lift in shoe bilat, will defer to later sessions  HEP development  -Seated Left ankle tib posterior inversion A/ROM (seated FABER position 2x10 c GTB -seated Heel raises 1x20   -SLS 5x10sec bilat (deferred due to time)  -normal stance gross toe extension 15x5secH, cues for 1st MTP contact to floor  -heel hang off step stretch   Orthotic assessment on ground; excellent interface of orthotic contour and foot, Rt > Left (slightly higher Left arch); both appear to help with SLS stability temporal aspects, less pronatory failure, exacerbates ability to actively drop first MTP (may benefit from elevation of 1st MTP in shoe, will defer)  Orthotic adjustment to make sure heel riser does not change contour of arch support of orthotic insert, moved to under the stock foam insole   -education to DC epsom salt HOT water soaks, focus on cold therapy throughout day         PT Short Term Goals - 01/15/21 1552      PT SHORT TERM GOAL #1   Title Pt will be independent with HEP in order to decrease foot pain and increase strength/ROM in order to improve pain-free function at home.    Time 4    Period Weeks    Status New    Target Date 02/12/21             PT Long Term Goals - 01/15/21 1553      PT LONG TERM GOAL #1   Title Pt will decrease worst pain as reported on NPRS by at least 3 points in order to demonstrate clinically significant reduction in foot pain.    Baseline 01/15/21: worst: 10/10    Time 8    Period Weeks    Status New    Target Date 03/12/21      PT LONG TERM GOAL #2   Title Pt will increase LEFS by at least 9 points in order to demonstrate significant  improvement in lower extremity function.    Baseline 01/15/21: to be completed at next visit    Time 8    Period Weeks    Status New    Target Date 03/12/21      PT LONG TERM GOAL #3   Title Pt will improve painless L ankle active eversion to within 2 degrees of R side in order to demonstrate improved ankle mobility with less pain  Baseline 01/15/21: R/L 18/12 Ankle Eversion (L ankle painful)    Time 8    Period Weeks    Status New    Target Date 03/12/21      PT LONG TERM GOAL #4   Title Pt will improve FOTO to at least 50 in order to demonstrate significant improvement in L ankle function    Baseline 01/15/21: 26    Time 8    Period Weeks    Status New    Target Date 03/12/21                 Plan - 01/21/21 0901    Clinical Impression Statement Continued with current plan of care as laid out in evaluation and recent prior sessions. Gave education on symptoms/activity management. Made modification of insoles to maintain contour of arch support and efficacy of heel lift without the two interfering with one another. MMT and ROM assessment of toes, motor control assessment of foot ankle righting strategies. Pt educated on first HEP with basic stretches and activation activities intended to be safe for performance during periods of severe pain aggravation. Pt remains motivated to advance progress toward goals. Rest breaks provided as needed, pt quick to ask when needed. Author maintains all interventions within appropriate level of intensity as not to purposefully exacerbate pain. Pt does require varying levels of assistance and cuing for completion of exercises for correct form and sometimes due to pain/weakness.    Personal Factors and Comorbidities Time since onset of injury/illness/exacerbation;Past/Current Experience;Comorbidity 3+    Comorbidities HTN, hyperlipidemia, scarcoidosis, IBS    Examination-Activity Limitations Squat;Stand    Examination-Participation Restrictions  Community Activity;Cleaning;Meal Prep    Stability/Clinical Decision Making Evolving/Moderate complexity    Clinical Decision Making Moderate    Rehab Potential Good    PT Frequency 2x / week    PT Duration 8 weeks    PT Treatment/Interventions Canalith Repostioning;Gait training;Stair training;Therapeutic activities;Balance training;Therapeutic exercise;Neuromuscular re-education;Patient/family education;Vestibular;Functional mobility training;ADLs/Self Care Home Management;Aquatic Therapy;Biofeedback;Electrical Stimulation;Cryotherapy;Iontophoresis 73m/ml Dexamethasone;Moist Heat;Ultrasound;Traction;Manual techniques;Passive range of motion;Dry needling;Taping;Joint Manipulations;Other (comment)    PT Next Visit Plan Have pt complete LEFS, review, HEP exercises/stretches, iontophoresis    PT Home Exercise Plan See above    Consulted and Agree with Plan of Care Patient           Patient will benefit from skilled therapeutic intervention in order to improve the following deficits and impairments:  Pain,Abnormal gait,Difficulty walking  Visit Diagnosis: Pain in left foot  Dizziness and giddiness  Unsteadiness on feet  Chronic midline low back pain without sciatica     Problem List Patient Active Problem List   Diagnosis Date Noted  . Pure hypercholesterolemia 11/04/2016  . Essential hypertension 09/18/2015  . Hyperlipidemia 09/18/2015  . Recurrent genital herpes simplex 09/18/2015   10:36 AM, 01/21/21 AEtta Grandchild PT, DPT Physical Therapist - [facility] [place]     BEtta Grandchild1/31/2022, 9:05 AM  [place] [facility] [place] [address]Phone: [phone]  Fax:  [phone] Name: Cheyenne JeffordsMRN: [id number] of Birth: [birth year]

## 2021-01-23 ENCOUNTER — Other Ambulatory Visit: Payer: Self-pay

## 2021-01-23 ENCOUNTER — Ambulatory Visit: Payer: Medicaid Other | Attending: Orthopaedic Surgery

## 2021-01-23 DIAGNOSIS — M79672 Pain in left foot: Secondary | ICD-10-CM | POA: Diagnosis not present

## 2021-01-23 NOTE — Therapy (Signed)
[facility] [address]. [place], [place], [address] Phone: [phone]   Fax:  [phone]  Physical Therapy Treatment  Patient Details  Name: Cheyenne Bean MRN: [id number] Date of Birth: September 24, 1956 Referring Provider (PT): Dr. Gardiner Fanti   Encounter Date: 01/23/2021   PT End of Session - 01/23/21 1245    Visit Number 3    Number of Visits 17    Date for PT Re-Evaluation 03/12/21    Authorization Type eval: 01/15/21, FOTO predicted visit count 14    PT Start Time 0850    PT Stop Time 0930    PT Time Calculation (min) 40 min    Activity Tolerance Patient tolerated treatment well;Patient limited by pain    Behavior During Therapy [facility] for tasks assessed/performed           Past Medical History:  Diagnosis Date  . BRCA negative 01/2017   MyRisk neg  . Family history of breast cancer    1/18 IBIS=10.7%  . Family history of ovarian cancer    MyRisk neg  . Genital herpes   . Hyperlipidemia   . Hypertension   . IBS (irritable bowel syndrome) 02/20/2017  . Sarcoidosis     Past Surgical History:  Procedure Laterality Date  . BACK SURGERY    . BUNIONECTOMY Left   . COLONOSCOPY  2012   cleared for 10 yrs  . COLONOSCOPY WITH PROPOFOL N/A 02/27/2017   Procedure: COLONOSCOPY WITH PROPOFOL;  Surgeon: Lollie Sails, MD;  Location: [facility] ENDOSCOPY;  Service: Endoscopy;  Laterality: N/A;  . VAGINAL HYSTERECTOMY      There were no vitals filed for this visit.   Subjective Assessment - 01/23/21 0853    Subjective Pt reports she is having 9/10 pain upon arrival today. She used the voltaren gel this morning. She also continues to use ice and reports that it does help with her pain. She reports maybe a slight improvement in her pain since adjusting her orthotics. Her pain was very aggravated after last therapy session.    Pertinent History Pt reports that approximately 1 year ago she started having L foot pain. She was  wearing a lot of flats around that time when the pain started started. The pain improved after approximately 1 week without any intervention. She was visiting her dad in October 2021 and her foot pain flared up significantly again. She was unable ot put weight on her foot due to the pain and had to use crutches to help her walk. She went to the urgent care and was sent to general orthopedics who recommended orthotics and gave her two steroid shots on her medial foot near her L posterior tibial tendon. She reports improvement for 1-2 days and then the pain returned. After returning to [place] she saw general orthopedics who placed a medial heel wedge in her shoes. He also prescribed her Voltaren gel for topical anti-inflammatory use to be used up to 4 times/day. 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 The pain is located on the medial aspect of her L foot at the insertion on the medial navicular and she describes it as "achy." No other recent changes in medication or health. History of chronic back pain with LLE radicular pain to the calf. Pt reports she has had back surgery without improvement but does not recall what type of surgery she had. Pt has a history of sarcoidosis but states that she had a breathing  test yesterday which was good.    Limitations Walking;Standing    How long can you stand comfortably? 5 minutes    Diagnostic tests MRI    Patient Stated Goals Improve foot pain so she can walk comfortably    Currently in Pain? Yes    Pain Score 9     Pain Location --   L posterior tibialis tendon insertion   Pain Orientation Left    Pain Descriptors / Indicators Aching    Pain Type Chronic pain    Pain Onset More than a month ago    Pain Frequency Intermittent              TREATMENT   Ther-ex   L ankle inversion isometrics 10s hold x 10; Passive L ankle eversion stretch 45s hold x 2; Seated heel raises 2 x 20; Seated towel scrunches x 4 minutes; HEP updated and  reviewed with patient;   Manual Therapy  STM to L posterior tibialis and gastroc/soleus complex; L TC A/P mobilizations at available end range dorsiflexion, grade III, 30s/bout x 2 bouts; Distal L fibula on tibia A/P mobilizations , grade III, 30s/bout x 2 bouts; L TC distraction 30s/bout x 2 bouts; L calcaneus distraction 30s/bout x 2 bouts; Navicular and cuboid superior to inferior mobilizations, grade II-III, 20s/bout x 2 bouts each;   Iontophoresis Iontophoresis applied to L medial navicular at site of tibialis tendon insertion with 3.5 mL of dexamethasone (5m/mL) with 45mmin dosage at 3-4.0 mA pending pt tolerance (current decreased part of the way through due to discomfort) x approximately 12 minutes. Pt in long sitting position and skin inspected afterward. Pt with a few small white papules near the 1-2 o'clock position (navicular being the center of the clock). Pt provided education during and afterward.    Pt educated throughout session about proper posture and technique with exercises. Improved exercise technique, movement at target joints, use of target muscles after min to mod verbal, visual, tactile cues.   Pt reports continued discomfort/pain in L foot. Performed manual techniques today during session today as well as foot/ankle strengthening. Also utilized iontophoresis with dexamethasone to help reduce inflammation at posterior tibialis tendon insertion. HEP modified today and pt encouraged to continue with aggressive icing while she is traveling. Pt will benefit from PT services to address deficits in L foot pain in order to return to full pain-free function at home.                          PT Short Term Goals - 01/15/21 1552      PT SHORT TERM GOAL #1   Title Pt will be independent with HEP in order to decrease foot pain and increase strength/ROM in order to improve pain-free function at home.    Time 4    Period Weeks    Status New    Target  Date 02/12/21             PT Long Term Goals - 01/15/21 1553      PT LONG TERM GOAL #1   Title Pt will decrease worst pain as reported on NPRS by at least 3 points in order to demonstrate clinically significant reduction in foot pain.    Baseline 01/15/21: worst: 10/10    Time 8    Period Weeks    Status New    Target Date 03/12/21      PT LONG TERM GOAL #2   Title  Pt will increase LEFS by at least 9 points in order to demonstrate significant improvement in lower extremity function.    Baseline 01/15/21: to be completed at next visit    Time 8    Period Weeks    Status New    Target Date 03/12/21      PT LONG TERM GOAL #3   Title Pt will improve painless L ankle active eversion to within 2 degrees of R side in order to demonstrate improved ankle mobility with less pain    Baseline 01/15/21: R/L 18/12 Ankle Eversion (L ankle painful)    Time 8    Period Weeks    Status New    Target Date 03/12/21      PT LONG TERM GOAL #4   Title Pt will improve FOTO to at least 50 in order to demonstrate significant improvement in L ankle function    Baseline 01/15/21: 26    Time 8    Period Weeks    Status New    Target Date 03/12/21                 Plan - 01/23/21 0921    Clinical Impression Statement Pt reports continued discomfort/pain in L foot. Performed manual techniques today during session today as well as foot/ankle strengthening. Also utilized iontophoresis with dexamethasone to help reduce inflammation at posterior tibialis tendon insertion. HEP modified today and pt encouraged to continue with aggressive icing while she is traveling. Pt will benefit from PT services to address deficits in L foot pain in order to return to full pain-free function at home.    Personal Factors and Comorbidities Time since onset of injury/illness/exacerbation;Past/Current Experience;Comorbidity 3+    Comorbidities HTN, hyperlipidemia, scarcoidosis, IBS    Examination-Activity Limitations  Squat;Stand    Examination-Participation Restrictions Community Activity;Cleaning;Meal Prep    Stability/Clinical Decision Making Evolving/Moderate complexity    Rehab Potential Good    PT Frequency 2x / week    PT Duration 8 weeks    PT Treatment/Interventions Canalith Repostioning;Gait training;Stair training;Therapeutic activities;Balance training;Therapeutic exercise;Neuromuscular re-education;Patient/family education;Vestibular;Functional mobility training;ADLs/Self Care Home Management;Aquatic Therapy;Biofeedback;Electrical Stimulation;Cryotherapy;Iontophoresis 44m/ml Dexamethasone;Moist Heat;Ultrasound;Traction;Manual techniques;Passive range of motion;Dry needling;Taping;Joint Manipulations;Other (comment)   Phonophoresis with 1% Diclofenac topical   PT Next Visit Plan Have pt complete LEFS, Assess ROM and strength of digits, continue exercises/stretches, assess response to iontophoresis    PT Home Exercise Plan Access Code: [email or web address]   Consulted and Agree with Plan of Care Patient           Patient will benefit from skilled therapeutic intervention in order to improve the following deficits and impairments:  Pain,Abnormal gait,Difficulty walking  Visit Diagnosis: Pain in left foot     Problem List Patient Active Problem List   Diagnosis Date Noted  . Pure hypercholesterolemia 11/04/2016  . Essential hypertension 09/18/2015  . Hyperlipidemia 09/18/2015  . Recurrent genital herpes simplex 09/18/2015   JLyndel SafeHuprich PT, DPT, GCS  Craigory Toste 01/23/2021, 1:14 PM  [place] [facility] [place] [address]Phone: [phone]  Fax:  [phone] Name: Cheyenne MadewellMRN: [id number] of Birth: 116-Jan-1957

## 2021-01-23 NOTE — Patient Instructions (Signed)
Access Code: [email or web address] URL: [email or web address] Date: 01/23/2021 Prepared by: Ria Comment  Exercises Seated Arch Lifts - 2 x daily - 7 x weekly - 2 sets - 10 reps - 5s hold Isometric Ankle Inversion at Wall - 2 x daily - 7 x weekly - 2 sets - 5 reps - 10s hold Seated Ankle Inversion Eversion PROM - 2 x daily - 7 x weekly - 3 reps - 45s hold Seated Heel Raise - 2 x daily - 7 x weekly - 1 sets - 20 reps Standing Gastroc Stretch on Step with Counter Support - 2 x daily - 7 x weekly - 3 reps - 45s hold  Patient Education Ice Massage

## 2021-02-18 ENCOUNTER — Ambulatory Visit: Payer: Medicaid Other

## 2021-02-18 ENCOUNTER — Other Ambulatory Visit: Payer: Self-pay

## 2021-02-18 DIAGNOSIS — M79672 Pain in left foot: Secondary | ICD-10-CM

## 2021-02-18 NOTE — Therapy (Addendum)
[facility] [address] [place], [place], [address] Phone: [phone]   Fax:  [phone]  Physical Therapy Treatment  Patient Details  Name: Cheyenne Bean MRN: [id number] Date of Birth: November 25, 1956 Referring Provider (PT): Dr. Gardiner Fanti   Encounter Date: 02/18/2021   PT End of Session - 02/18/21 0805    Visit Number 4    Number of Visits 17    Date for PT Re-Evaluation 03/12/21    Authorization Type eval: 01/15/21, FOTO predicted visit count 14    PT Start Time 0802    PT Stop Time 0845    PT Time Calculation (min) 43 min    Activity Tolerance Patient tolerated treatment well;Patient limited by pain    Behavior During Therapy [facility] for tasks assessed/performed           Past Medical History:  Diagnosis Date  . BRCA negative 01/2017   MyRisk neg  . Family history of breast cancer    1/18 IBIS=10.7%  . Family history of ovarian cancer    MyRisk neg  . Genital herpes   . Hyperlipidemia   . Hypertension   . IBS (irritable bowel syndrome) 02/20/2017  . Sarcoidosis     Past Surgical History:  Procedure Laterality Date  . BACK SURGERY    . BUNIONECTOMY Left   . COLONOSCOPY  2012   cleared for 10 yrs  . COLONOSCOPY WITH PROPOFOL N/A 02/27/2017   Procedure: COLONOSCOPY WITH PROPOFOL;  Surgeon: Lollie Sails, MD;  Location: [facility] ENDOSCOPY;  Service: Endoscopy;  Laterality: N/A;  . VAGINAL HYSTERECTOMY      There were no vitals filed for this visit.   Subjective Assessment - 02/18/21 0803    Subjective Pt reports she is having 7/10 pain upon arrival today in standing. She returned from her trip Saturday evening. She reports that she used ice every day while she was out of town which helped her manage the pain. She also continued to use the Voltaren topical. She did not perform any of her HEP since her last therapy session because she was concerned it would aggravate her pain.    Pertinent History Pt reports  that approximately 1 year ago she started having L foot pain. She was wearing a lot of flats around that time when the pain started started. The pain improved after approximately 1 week without any intervention. She was visiting her dad in October 2021 and her foot pain flared up significantly again. She was unable ot put weight on her foot due to the pain and had to use crutches to help her walk. She went to the urgent care and was sent to general orthopedics who recommended orthotics and gave her two steroid shots on her medial foot near her L posterior tibial tendon. She reports improvement for 1-2 days and then the pain returned. After returning to [place] she saw general orthopedics who placed a medial heel wedge in her shoes. He also prescribed her Voltaren gel for topical anti-inflammatory use to be used up to 4 times/day. 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 The pain is located on the medial aspect of her L foot at the insertion on the medial navicular and she describes it as "achy." No other recent changes in medication or health. History of chronic back pain with LLE radicular pain to the calf. Pt reports she has had back surgery without improvement but does not recall what type of surgery  she had. Pt has a history of sarcoidosis but states that she had a breathing test yesterday which was good.    Limitations Walking;Standing    How long can you stand comfortably? 5 minutes    Diagnostic tests MRI    Patient Stated Goals Improve foot pain so she can walk comfortably    Currently in Pain? Yes    Pain Score 7     Pain Location --   L posterior tibial tendon insertion   Pain Orientation Left    Pain Descriptors / Indicators Aching    Pain Type Chronic pain    Pain Onset More than a month ago    Pain Frequency Intermittent                TREATMENT   Ther-ex   L ankle inversion isometrics 10s hold x 10; Passive L ankle eversion stretch 45s hold x 2; Seated  heel raises with ball between legs activate invertors 2 x 10; Seated talar doming with verbal and tactile cues about how to perform correctly 3s hold 2 x 10; HEP reviewed with patient and new handout provided;   Manual Therapy  STM to L posterior tibialis and gastroc/soleus complex as well as plantar surface of foot with occasional instrument assist; L TC A/P mobilizations at available end range dorsiflexion, grade III, 30s/bout x 3 bouts; L TC distraction 30s/bout x 3 bouts; Navicular and cuboid superior to inferior mobilizations, grade II-III, 30s/bout x 2 bouts each;   Trigger Point Dry Needling (TDN), unbilled Education performed with patient regarding potential benefit of TDN. Reviewed precautions and risks with patient. Reviewed special precautions/risks over lung fields which include pneumothorax. Reviewed signs and symptoms of pneumothorax and advised pt to go to ER immediately if these symptoms develop advise them of dry needling treatment. Extensive time spent with pt to ensure full understanding of TDN risks. Pt provided verbal consent to treatment. TDN performed to  with 0.25 x 40 single needle placements with local twitch response (LTR). Pistoning technique utilized. Improved pain-free motion following intervention.      Pt educated throughout session about proper posture and technique with exercises. Improved exercise technique, movement at target joints, use of target muscles after min to mod verbal, visual, tactile cues.   Pt reports continued discomfort/pain in L foot. It was helped with ice massage while she was on her trip. Initiated trigger point dry needling today which was painful but tolerable for patient. Performed manual techniques today during session today as well as L foot/ankle strengthening including isometric ankle inversion for pain-free strengthening of posterior tibialis as well as pain modulation. Reviewed HEP with patient and encouraged her to start performing  stretches/strengthening at home. Pt will benefit from PT services to address deficits in L foot pain in order to return to full pain-free function at home.                                 PT Short Term Goals - 01/15/21 1552      PT SHORT TERM GOAL #1   Title Pt will be independent with HEP in order to decrease foot pain and increase strength/ROM in order to improve pain-free function at home.    Time 4    Period Weeks    Status New    Target Date 02/12/21             PT Long Term Goals -  01/15/21 1553      PT LONG TERM GOAL #1   Title Pt will decrease worst pain as reported on NPRS by at least 3 points in order to demonstrate clinically significant reduction in foot pain.    Baseline 01/15/21: worst: 10/10    Time 8    Period Weeks    Status New    Target Date 03/12/21      PT LONG TERM GOAL #2   Title Pt will increase LEFS by at least 9 points in order to demonstrate significant improvement in lower extremity function.    Baseline 01/15/21: to be completed at next visit    Time 8    Period Weeks    Status New    Target Date 03/12/21      PT LONG TERM GOAL #3   Title Pt will improve painless L ankle active eversion to within 2 degrees of R side in order to demonstrate improved ankle mobility with less pain    Baseline 01/15/21: R/L 18/12 Ankle Eversion (L ankle painful)    Time 8    Period Weeks    Status New    Target Date 03/12/21      PT LONG TERM GOAL #4   Title Pt will improve FOTO to at least 50 in order to demonstrate significant improvement in L ankle function    Baseline 01/15/21: 26    Time 8    Period Weeks    Status New    Target Date 03/12/21                 Plan - 02/18/21 0806    Clinical Impression Statement Pt reports continued discomfort/pain in L foot. It was helped with ice massage while she was on her trip. Initiated trigger point dry needling today which was painful but tolerable for patient. Performed manual  techniques today during session today as well as L foot/ankle strengthening including isometric ankle inversion for pain-free strengthening of posterior tibialis as well as pain modulation. Reviewed HEP with patient and encouraged her to start performing stretches/strengthening at home. Pt will benefit from PT services to address deficits in L foot pain in order to return to full pain-free function at home.    Personal Factors and Comorbidities Time since onset of injury/illness/exacerbation;Past/Current Experience;Comorbidity 3+    Comorbidities HTN, hyperlipidemia, scarcoidosis, IBS    Examination-Activity Limitations Squat;Stand    Examination-Participation Restrictions Community Activity;Cleaning;Meal Prep    Stability/Clinical Decision Making Evolving/Moderate complexity    Rehab Potential Good    PT Frequency 2x / week    PT Duration 8 weeks    PT Treatment/Interventions Canalith Repostioning;Gait training;Stair training;Therapeutic activities;Balance training;Therapeutic exercise;Neuromuscular re-education;Patient/family education;Vestibular;Functional mobility training;ADLs/Self Care Home Management;Aquatic Therapy;Biofeedback;Electrical Stimulation;Cryotherapy;Iontophoresis 79m/ml Dexamethasone;Moist Heat;Ultrasound;Traction;Manual techniques;Passive range of motion;Dry needling;Taping;Joint Manipulations;Other (comment)   Phonophoresis with 1% Diclofenac topical   PT Next Visit Plan Have pt complete LEFS, Assess ROM and strength of digits, continue exercises/stretches, assess response to iontophoresis    PT Home Exercise Plan Access Code: [email or web address]   Consulted and Agree with Plan of Care Patient           Patient will benefit from skilled therapeutic intervention in order to improve the following deficits and impairments:  Pain,Abnormal gait,Difficulty walking  Visit Diagnosis: Pain in left foot     Problem List Patient Active Problem List   Diagnosis Date Noted  . Pure  hypercholesterolemia 11/04/2016  . Essential hypertension 09/18/2015  . Hyperlipidemia 09/18/2015  . Recurrent genital  herpes simplex 09/18/2015    Phillips Grout PT, DPT, GCS  Cheyenne Bean 02/18/2021, 9:05 AM  [place] [address] [place], [place], [address] Phone: [phone]   Fax:  [phone]  Name: Cheyenne Bean MRN: [id number] Date of Birth: Apr 01, 1956

## 2021-02-21 ENCOUNTER — Other Ambulatory Visit: Payer: Self-pay

## 2021-02-21 ENCOUNTER — Ambulatory Visit: Payer: Medicaid Other | Attending: Orthopaedic Surgery

## 2021-02-21 DIAGNOSIS — R42 Dizziness and giddiness: Secondary | ICD-10-CM | POA: Insufficient documentation

## 2021-02-21 DIAGNOSIS — R2681 Unsteadiness on feet: Secondary | ICD-10-CM | POA: Diagnosis present

## 2021-02-21 DIAGNOSIS — M79672 Pain in left foot: Secondary | ICD-10-CM | POA: Diagnosis not present

## 2021-02-21 NOTE — Therapy (Signed)
[place] [facility] [address] [place], [place], [address] Phone: [phone]   Fax:  [phone]  Physical Therapy Treatment  Patient Details  Name: Cheyenne Bean MRN: [id number] Date of Birth: 12-Nov-1956 Referring Provider (PT): Dr. Gardiner Fanti   Encounter Date: 02/21/2021   PT End of Session - 02/21/21 0855    Visit Number 5    Number of Visits 17    Date for PT Re-Evaluation 03/12/21    Authorization Type eval: 01/15/21, FOTO predicted visit count 14    PT Start Time 0848    PT Stop Time 0930    PT Time Calculation (min) 42 min    Activity Tolerance Patient tolerated treatment well;Patient limited by pain    Behavior During Therapy [facility] for tasks assessed/performed           Past Medical History:  Diagnosis Date  . BRCA negative 01/2017   MyRisk neg  . Family history of breast cancer    1/18 IBIS=10.7%  . Family history of ovarian cancer    MyRisk neg  . Genital herpes   . Hyperlipidemia   . Hypertension   . IBS (irritable bowel syndrome) 02/20/2017  . Sarcoidosis     Past Surgical History:  Procedure Laterality Date  . BACK SURGERY    . BUNIONECTOMY Left   . COLONOSCOPY  2012   cleared for 10 yrs  . COLONOSCOPY WITH PROPOFOL N/A 02/27/2017   Procedure: COLONOSCOPY WITH PROPOFOL;  Surgeon: Lollie Sails, MD;  Location: [facility] ENDOSCOPY;  Service: Endoscopy;  Laterality: N/A;  . VAGINAL HYSTERECTOMY      There were no vitals filed for this visit.   Subjective Assessment - 02/21/21 0851    Subjective Pt reports she is having 7/10 pain upon arrival today after walking around [employer] before her appointment today. She is complaining of some sciatic nerve pain down the LLE today which is a chornic issue for her. She has continued using ice massage at home as well as Voltaren topical gel. She performed her HEP yesterday without issue.    Pertinent History Pt reports that approximately 1 year ago she started having  L foot pain. She was wearing a lot of flats around that time when the pain started started. The pain improved after approximately 1 week without any intervention. She was visiting her dad in October 2021 and her foot pain flared up significantly again. She was unable ot put weight on her foot due to the pain and had to use crutches to help her walk. She went to the urgent care and was sent to general orthopedics who recommended orthotics and gave her two steroid shots on her medial foot near her L posterior tibial tendon. She reports improvement for 1-2 days and then the pain returned. After returning to [place] she saw general orthopedics who placed a medial heel wedge in her shoes. He also prescribed her Voltaren gel for topical anti-inflammatory use to be used up to 4 times/day. 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 The pain is located on the medial aspect of her L foot at the insertion on the medial navicular and she describes it as "achy." No other recent changes in medication or health. History of chronic back pain with LLE radicular pain to the calf. Pt reports she has had back surgery without improvement but does not recall what type of surgery she had. Pt has a history of sarcoidosis but states that  she had a breathing test yesterday which was good.    Limitations Walking;Standing    How long can you stand comfortably? 5 minutes    Diagnostic tests MRI    Patient Stated Goals Improve foot pain so she can walk comfortably    Currently in Pain? Yes    Pain Score 7     Pain Location Ankle    Pain Orientation Left    Pain Descriptors / Indicators Aching    Pain Type Chronic pain    Pain Onset More than a month ago    Pain Frequency Intermittent              TREATMENT   Ther-ex   Moist heat pack applied to L ankle x 4 minutes at start of session during interval history; L ankle circles CW/CCW x 20 each; L ankle/foot ABC x 1 bout; L ankle inversion isometrics  10s hold x 10; L ankle eccentric inversion x 10; Passive L ankle eversion stretch 45s hold x 2; Seated heel raises with ball between legs activate invertors 2 x 10; Seated talar doming with verbal and tactile cues about how to perform correctly 3s hold x 10; HEP reviewed with patient and new handout provided;   Manual Therapy  STM to L posterior tibialis and gastroc/soleus complex as well as plantar surface of foot with occasional instrument assist; L TC A/P mobilizations at available end range dorsiflexion, grade III, 30s/bout x 3 bouts; L TC distraction 30s/bout x 3 bouts; L distal fibula on tibia A/P mobilizations grade III, 30s/bout x 3 bouts; Navicular and cuboid superior to inferior mobilizations, grade II-III, 30s/bout x 2 bouts each; Kinesiotape applied to L foot/ankle with 3 strips (one long strip from met heads around heel and up calf, two short strips from lateral aspect of foot to medial supporting the transverse arch);   Pt educated throughout session about proper posture and technique with exercises. Improved exercise technique, movement at target joints, use of target muscles after min to mod verbal, visual, tactile cues.   Pt reports continued discomfort/pain in L foot however improved after last therapy session.  Patient reports that trigger point dry needling was helpful.  Deferred today and will perform again during next session if appropriate.  Continued with manual therapy techniques as well as strengthening today.  Initiated eccentric inversion strengthening which patient is able to perform without any notable increase in pain.  Encourage patient to continue her HEP and follow-up as scheduled.  Pt will benefit from PT services to address deficits in L foot pain in order to return to full pain-free function at home.                           PT Short Term Goals - 01/15/21 1552      PT SHORT TERM GOAL #1   Title Pt will be independent with  HEP in order to decrease foot pain and increase strength/ROM in order to improve pain-free function at home.    Time 4    Period Weeks    Status New    Target Date 02/12/21             PT Long Term Goals - 01/15/21 1553      PT LONG TERM GOAL #1   Title Pt will decrease worst pain as reported on NPRS by at least 3 points in order to demonstrate clinically significant reduction in foot pain.  Baseline 01/15/21: worst: 10/10    Time 8    Period Weeks    Status New    Target Date 03/12/21      PT LONG TERM GOAL #2   Title Pt will increase LEFS by at least 9 points in order to demonstrate significant improvement in lower extremity function.    Baseline 01/15/21: to be completed at next visit    Time 8    Period Weeks    Status New    Target Date 03/12/21      PT LONG TERM GOAL #3   Title Pt will improve painless L ankle active eversion to within 2 degrees of R side in order to demonstrate improved ankle mobility with less pain    Baseline 01/15/21: R/L 18/12 Ankle Eversion (L ankle painful)    Time 8    Period Weeks    Status New    Target Date 03/12/21      PT LONG TERM GOAL #4   Title Pt will improve FOTO to at least 50 in order to demonstrate significant improvement in L ankle function    Baseline 01/15/21: 26    Time 8    Period Weeks    Status New    Target Date 03/12/21                 Plan - 02/21/21 0856    Clinical Impression Statement Pt reports continued discomfort/pain in L foot however improved after last therapy session.  Patient reports that trigger point dry needling was helpful.  Deferred today and will perform again during next session if appropriate.  Continued with manual therapy techniques as well as strengthening today.  Initiated eccentric inversion strengthening which patient is able to perform without any notable increase in pain.  Encourage patient to continue her HEP and follow-up as scheduled.  Pt will benefit from PT services to address  deficits in L foot pain in order to return to full pain-free function at home.    Personal Factors and Comorbidities Time since onset of injury/illness/exacerbation;Past/Current Experience;Comorbidity 3+    Comorbidities HTN, hyperlipidemia, scarcoidosis, IBS    Examination-Activity Limitations Squat;Stand    Examination-Participation Restrictions Community Activity;Cleaning;Meal Prep    Stability/Clinical Decision Making Evolving/Moderate complexity    Rehab Potential Good    PT Frequency 2x / week    PT Duration 8 weeks    PT Treatment/Interventions Canalith Repostioning;Gait training;Stair training;Therapeutic activities;Balance training;Therapeutic exercise;Neuromuscular re-education;Patient/family education;Vestibular;Functional mobility training;ADLs/Self Care Home Management;Aquatic Therapy;Biofeedback;Electrical Stimulation;Cryotherapy;Iontophoresis 19m/ml Dexamethasone;Moist Heat;Ultrasound;Traction;Manual techniques;Passive range of motion;Dry needling;Taping;Joint Manipulations;Other (comment)   Phonophoresis with 1% Diclofenac topical   PT Next Visit Plan Have pt complete LEFS, Assess ROM and strength of digits, continue exercises/stretches, assess response to iontophoresis    PT Home Exercise Plan Access Code: [email or web address]   Consulted and Agree with Plan of Care Patient           Patient will benefit from skilled therapeutic intervention in order to improve the following deficits and impairments:  Pain,Abnormal gait,Difficulty walking  Visit Diagnosis: Pain in left foot     Problem List Patient Active Problem List   Diagnosis Date Noted  . Pure hypercholesterolemia 11/04/2016  . Essential hypertension 09/18/2015  . Hyperlipidemia 09/18/2015  . Recurrent genital herpes simplex 09/18/2015   JLyndel SafeHuprich PT, DPT, GCS  Huprich,Jason 02/21/2021, 3:08 PM  [place] [facility] [place] [address]Phone:  [phone]  Fax:  [phone]  Name: Meilani Edmundson MRN: [id number] Date of Birth: [birth year]

## 2021-02-25 ENCOUNTER — Ambulatory Visit: Payer: Medicaid Other

## 2021-02-25 ENCOUNTER — Other Ambulatory Visit: Payer: Self-pay

## 2021-02-25 DIAGNOSIS — M79672 Pain in left foot: Secondary | ICD-10-CM

## 2021-02-25 NOTE — Therapy (Signed)
[facility] [address] [place], [place], [address] Phone: [phone]   Fax:  [phone]  Physical Therapy Treatment  Patient Details  Name: Cheyenne Bean MRN: [id number] Date of Birth: [birth year] Referring Provider (PT): Dr. Gardiner Fanti   Encounter Date: 02/25/2021   PT End of Session - 02/25/21 0937    Visit Number 6    Number of Visits 17    Date for PT Re-Evaluation 03/12/21    Authorization Type eval: 01/15/21, FOTO predicted visit count 14    PT Start Time 0932    PT Stop Time 1015    PT Time Calculation (min) 43 min    Activity Tolerance Patient tolerated treatment well;Patient limited by pain    Behavior During Therapy [facility] for tasks assessed/performed           Past Medical History:  Diagnosis Date  . BRCA negative 01/2017   MyRisk neg  . Family history of breast cancer    1/18 IBIS=10.7%  . Family history of ovarian cancer    MyRisk neg  . Genital herpes   . Hyperlipidemia   . Hypertension   . IBS (irritable bowel syndrome) 02/20/2017  . Sarcoidosis     Past Surgical History:  Procedure Laterality Date  . BACK SURGERY    . BUNIONECTOMY Left   . COLONOSCOPY  2012   cleared for 10 yrs  . COLONOSCOPY WITH PROPOFOL N/A 02/27/2017   Procedure: COLONOSCOPY WITH PROPOFOL;  Surgeon: Lollie Sails, MD;  Location: [facility] ENDOSCOPY;  Service: Endoscopy;  Laterality: N/A;  . VAGINAL HYSTERECTOMY      There were no vitals filed for this visit.   Subjective Assessment - 02/25/21 0930    Subjective Pt reports she is having 6/10 pain upon arrival today. She reports that her LLE sciatic nerve pain is improving. She has been taking Tylenol for this issue which is helpful. She reports that the kinesiotape was helpful in providing support and decreasing pain in her L ankle/foot.    Pertinent History Pt reports that approximately 1 year ago she started having L foot pain. She was wearing a lot of flats around  that time when the pain started started. The pain improved after approximately 1 week without any intervention. She was visiting her dad in October 2021 and her foot pain flared up significantly again. She was unable ot put weight on her foot due to the pain and had to use crutches to help her walk. She went to the urgent care and was sent to general orthopedics who recommended orthotics and gave her two steroid shots on her medial foot near her L posterior tibial tendon. She reports improvement for 1-2 days and then the pain returned. After returning to [place] she saw general orthopedics who placed a medial heel wedge in her shoes. He also prescribed her Voltaren gel for topical anti-inflammatory use to be used up to 4 times/day. 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 The pain is located on the medial aspect of her L foot at the insertion on the medial navicular and she describes it as "achy." No other recent changes in medication or health. History of chronic back pain with LLE radicular pain to the calf. Pt reports she has had back surgery without improvement but does not recall what type of surgery she had. Pt has a history of sarcoidosis but states that she had a breathing test yesterday which was good.  Limitations Walking;Standing    How long can you stand comfortably? 5 minutes    Diagnostic tests MRI    Patient Stated Goals Improve foot pain so she can walk comfortably    Currently in Pain? Yes    Pain Score 6     Pain Location Ankle    Pain Orientation Left    Pain Descriptors / Indicators Aching    Pain Type Chronic pain    Pain Onset More than a month ago    Pain Frequency Intermittent             TREATMENT   Ther-ex   Moist heat pack applied to L ankle x 5 minutes at start of session during interval history; Passive L ankle eversion stretch 45s hold x 2; Seated heel raises with ball between legs to activate posterior tibialis 2 x 10; Seated talar  doming with verbal and tactile cues about how to perform correctly 3s hold x 10; L ankle eccentric inversion 2 x 10;   Manual Therapy  L TC A/P mobilizations at available end range dorsiflexion, grade III, 30s/bout x 3 bouts; L TC distraction 30s/bout x 3 bouts; L distal fibula on tibia A/P mobilizations grade III, 30s/bout x 3 bouts; Navicular and cuboid superior to inferior mobilizations, grade II-III, 30s/bout x 2 bouts each; STM to L posterior tibialis and gastroc/soleus complexas well as plantar surface of foot with occasional instrument assist; Kinesiotape applied to L foot/ankle with 3 strips (one long strip from met heads around heel and up calf, two short strips from lateral aspect of foot to medial supporting the transverse arch);   Trigger Point Dry Needling (TDN), unbilled Education performed with patient regarding potential benefit of TDN. Reviewed precautions and risks with patient. Reviewed special precautions/risks over lung fields which include pneumothorax. Reviewed signs and symptoms of pneumothorax and advised pt to go to ER immediately if these symptoms develop advise them of dry needling treatment. Extensive time spent with pt to ensure full understanding of TDN risks. Pt provided verbal consent to treatment. TDN performed to L posterior tibialis with 3, 0.25 x 40 single needle placements with deep ache reported. Pistoning technique utilized.    Pt educated throughout session about proper posture and technique with exercises. Improved exercise technique, movement at target joints, use of target muscles after min to mod verbal, visual, tactile cues.   Pt reports continued discomfort/pain in L foot. She reports improvement since last therapy session with support provided by kinesiotape as applied by therapist. Continued with trigger point dry needling today which was less painful today compared to previous session. Performed manual techniques today during session  today as well as L foot/ankle strengthening including eccentric L ankle inversion for pain-free strengthening of posterior tibialis as well as pain modulation. Applied kinesiotape again to help provide support to L foot and ankle. Reviewed HEP with patient and encouraged her to start performing stretches/strengthening at home. Pt will benefit from PT services to address deficits in L foot pain in order to return to full pain-free function at home.                                PT Short Term Goals - 01/15/21 1552      PT SHORT TERM GOAL #1   Title Pt will be independent with HEP in order to decrease foot pain and increase strength/ROM in order to improve pain-free function at home.  Time 4    Period Weeks    Status New    Target Date 02/12/21             PT Long Term Goals - 01/15/21 1553      PT LONG TERM GOAL #1   Title Pt will decrease worst pain as reported on NPRS by at least 3 points in order to demonstrate clinically significant reduction in foot pain.    Baseline 01/15/21: worst: 10/10    Time 8    Period Weeks    Status New    Target Date 03/12/21      PT LONG TERM GOAL #2   Title Pt will increase LEFS by at least 9 points in order to demonstrate significant improvement in lower extremity function.    Baseline 01/15/21: to be completed at next visit    Time 8    Period Weeks    Status New    Target Date 03/12/21      PT LONG TERM GOAL #3   Title Pt will improve painless L ankle active eversion to within 2 degrees of R side in order to demonstrate improved ankle mobility with less pain    Baseline 01/15/21: R/L 18/12 Ankle Eversion (L ankle painful)    Time 8    Period Weeks    Status New    Target Date 03/12/21      PT LONG TERM GOAL #4   Title Pt will improve FOTO to at least 50 in order to demonstrate significant improvement in L ankle function    Baseline 01/15/21: 26    Time 8    Period Weeks    Status New    Target Date  03/12/21                 Plan - 02/25/21 0937    Clinical Impression Statement Pt reports continued discomfort/pain in L foot. She reports improvement since last therapy session with support provided by kinesiotape as applied by therapist. Continued with trigger point dry needling today which was less painful today compared to previous session. Performed manual techniques today during session today as well as L foot/ankle strengthening including eccentric L ankle inversion for pain-free strengthening of posterior tibialis as well as pain modulation. Applied kinesiotape again to help provide support to L foot and ankle. Reviewed HEP with patient and encouraged her to start performing stretches/strengthening at home. Pt will benefit from PT services to address deficits in L foot pain in order to return to full pain-free function at home.    Personal Factors and Comorbidities Time since onset of injury/illness/exacerbation;Past/Current Experience;Comorbidity 3+    Comorbidities HTN, hyperlipidemia, scarcoidosis, IBS    Examination-Activity Limitations Squat;Stand    Examination-Participation Restrictions Community Activity;Cleaning;Meal Prep    Stability/Clinical Decision Making Evolving/Moderate complexity    Rehab Potential Good    PT Frequency 2x / week    PT Duration 8 weeks    PT Treatment/Interventions Canalith Repostioning;Gait training;Stair training;Therapeutic activities;Balance training;Therapeutic exercise;Neuromuscular re-education;Patient/family education;Vestibular;Functional mobility training;ADLs/Self Care Home Management;Aquatic Therapy;Biofeedback;Electrical Stimulation;Cryotherapy;Iontophoresis 64m/ml Dexamethasone;Moist Heat;Ultrasound;Traction;Manual techniques;Passive range of motion;Dry needling;Taping;Joint Manipulations;Other (comment)   Phonophoresis with 1% Diclofenac topical   PT Next Visit Plan Assess ROM and strength of digits, continue exercises/stretches, assess  response to iontophoresis    PT Home Exercise Plan Access Code: [email or web address]   Consulted and Agree with Plan of Care Patient           Patient will benefit from skilled therapeutic intervention in order  to improve the following deficits and impairments:  Pain,Abnormal gait,Difficulty walking  Visit Diagnosis: Pain in left foot     Problem List Patient Active Problem List   Diagnosis Date Noted  . Pure hypercholesterolemia 11/04/2016  . Essential hypertension 09/18/2015  . Hyperlipidemia 09/18/2015  . Recurrent genital herpes simplex 09/18/2015   Lyndel Safe Huprich PT, DPT, GCS  Huprich,Jason 02/25/2021, 3:54 PM  [facility] [address] [place], [place], [address] Phone: [phone]   Fax:  [phone]  Name: Letishia Elliott MRN: [id number] Date of Birth: May 08, 1956

## 2021-02-27 ENCOUNTER — Ambulatory Visit: Payer: Medicaid Other

## 2021-02-27 ENCOUNTER — Other Ambulatory Visit: Payer: Self-pay

## 2021-02-27 DIAGNOSIS — M79672 Pain in left foot: Secondary | ICD-10-CM | POA: Diagnosis not present

## 2021-02-27 NOTE — Therapy (Signed)
[facility] [address] [place], [place], [address] Phone: [phone]   Fax:  [phone]  Physical Therapy Treatment  Patient Details  Name: Cheyenne Bean MRN: [id number] Date of Birth: 04/19/1956 Referring Provider (PT): Dr. Gardiner Fanti   Encounter Date: 02/27/2021   PT End of Session - 02/27/21 0800    Visit Number 7    Number of Visits 17    Date for PT Re-Evaluation 03/12/21    Authorization Type eval: 01/15/21, FOTO predicted visit count 14    PT Start Time 0801    PT Stop Time 0845    PT Time Calculation (min) 44 min    Activity Tolerance Patient tolerated treatment well;Patient limited by pain    Behavior During Therapy [facility] for tasks assessed/performed           Past Medical History:  Diagnosis Date  . BRCA negative 01/2017   MyRisk neg  . Family history of breast cancer    1/18 IBIS=10.7%  . Family history of ovarian cancer    MyRisk neg  . Genital herpes   . Hyperlipidemia   . Hypertension   . IBS (irritable bowel syndrome) 02/20/2017  . Sarcoidosis     Past Surgical History:  Procedure Laterality Date  . BACK SURGERY    . BUNIONECTOMY Left   . COLONOSCOPY  2012   cleared for 10 yrs  . COLONOSCOPY WITH PROPOFOL N/A 02/27/2017   Procedure: COLONOSCOPY WITH PROPOFOL;  Surgeon: Lollie Sails, MD;  Location: [facility] ENDOSCOPY;  Service: Endoscopy;  Laterality: N/A;  . VAGINAL HYSTERECTOMY      There were no vitals filed for this visit.   Subjective Assessment - 02/27/21 0759    Subjective Pt reports she is having 8/10 pain upon arrival today. Her L hip pain continues to improve. She states that she had more soreness yesterday than she has had during previous therapy sessions. She reports that the kinesiotape was not as helpful in providing support and decreasing pain in her L ankle/foot after last session compared to prior visits.    Pertinent History Pt reports that approximately 1 year ago she  started having L foot pain. She was wearing a lot of flats around that time when the pain started started. The pain improved after approximately 1 week without any intervention. She was visiting her dad in October 2021 and her foot pain flared up significantly again. She was unable ot put weight on her foot due to the pain and had to use crutches to help her walk. She went to the urgent care and was sent to general orthopedics who recommended orthotics and gave her two steroid shots on her medial foot near her L posterior tibial tendon. She reports improvement for 1-2 days and then the pain returned. After returning to [place] she saw general orthopedics who placed a medial heel wedge in her shoes. He also prescribed her Voltaren gel for topical anti-inflammatory use to be used up to 4 times/day. 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 The pain is located on the medial aspect of her L foot at the insertion on the medial navicular and she describes it as "achy." No other recent changes in medication or health. History of chronic back pain with LLE radicular pain to the calf. Pt reports she has had back surgery without improvement but does not recall what type of surgery she had. Pt has a history of sarcoidosis but states  that she had a breathing test yesterday which was good.    Limitations Walking;Standing    How long can you stand comfortably? 5 minutes    Diagnostic tests MRI    Patient Stated Goals Improve foot pain so she can walk comfortably    Currently in Pain? Yes    Pain Score 8     Pain Location Ankle    Pain Orientation Left    Pain Descriptors / Indicators Aching    Pain Type Chronic pain    Pain Onset More than a month ago             TREATMENT   Ther-ex Moist heat pack applied to L ankle x 5 minutes at start of session during interval history (2 minutes unbilled); Passive L ankle eversion stretch 45s hold x 2; L ankle eccentric inversion 2 x 10; Passive  L ankle dorsiflexion stretch x 45s; L ankle circles clockwise/counterclockwise x 20 each direction; L ankle ABC x 1 round; Seatedtalar doming 3s hold x 10; Standing Prostretch calf stretch 45s x 3; Total Gym L22 eccentric L calf lowering (concentric heel raise on R side only) 2 x 10;   Manual Therapy L TC A/P mobilizations at available end range dorsiflexion, grade III, 30s/bout x3bouts; L TC distraction 30s/bout x3bouts; STM to L posterior tibialis and gastroc/soleus complexas well as plantar surface of foot with occasional instrument assist; Kinesiotape applied to L foot/ankle with 3 strips (one long strip from met heads around heel and up calf, two short strips from lateral aspect of foot to medial supporting the transverse arch);   Pt educated throughout session about proper posture and technique with exercises. Improved exercise technique, movement at target joints, use of target muscles after min to mod verbal, visual, tactile cues.   Pt reports continued discomfort/pain in L foot. It is slightly more aggravated compared to previous visit. Deferred trigger point dry needling today since it was performed at last session and pt is still sore.Performed manual techniques today during session today as well asLfoot/ankle strengtheningincluding eccentric L ankle inversion and eccentric heel lowering on Total Gym. Pt reports that she is able to feel pulling along her posterior tib tendon during eccentric heel lower. Applied kinesiotape again to help provide support to L foot and ankle. Pt encouraged to continue HEP. Shewill benefit from PT services to address deficits in L foot pain in order to return to full pain-free function at home.                           PT Short Term Goals - 01/15/21 1552      PT SHORT TERM GOAL #1   Title Pt will be independent with HEP in order to decrease foot pain and increase strength/ROM in order to improve pain-free  function at home.    Time 4    Period Weeks    Status New    Target Date 02/12/21             PT Long Term Goals - 01/15/21 1553      PT LONG TERM GOAL #1   Title Pt will decrease worst pain as reported on NPRS by at least 3 points in order to demonstrate clinically significant reduction in foot pain.    Baseline 01/15/21: worst: 10/10    Time 8    Period Weeks    Status New    Target Date 03/12/21  PT LONG TERM GOAL #2   Title Pt will increase LEFS by at least 9 points in order to demonstrate significant improvement in lower extremity function.    Baseline 01/15/21: to be completed at next visit    Time 8    Period Weeks    Status New    Target Date 03/12/21      PT LONG TERM GOAL #3   Title Pt will improve painless L ankle active eversion to within 2 degrees of R side in order to demonstrate improved ankle mobility with less pain    Baseline 01/15/21: R/L 18/12 Ankle Eversion (L ankle painful)    Time 8    Period Weeks    Status New    Target Date 03/12/21      PT LONG TERM GOAL #4   Title Pt will improve FOTO to at least 50 in order to demonstrate significant improvement in L ankle function    Baseline 01/15/21: 26    Time 8    Period Weeks    Status New    Target Date 03/12/21                 Plan - 02/27/21 0800    Clinical Impression Statement Pt reports continued discomfort/pain in L foot. It is slightly more aggravated compared to previous visit. Deferred trigger point dry needling today since it was performed at last session and pt is still sore. Performed manual techniques today during session today as well as L foot/ankle strengthening including eccentric L ankle inversion and eccentric heel lowering on Total Gym. Pt reports that she is able to feel pulling along her posterior tib tendon during eccentric heel lower. Applied kinesiotape again to help provide support to L foot and ankle. Pt encouraged to continue HEP. She will benefit from PT services  to address deficits in L foot pain in order to return to full pain-free function at home.    Personal Factors and Comorbidities Time since onset of injury/illness/exacerbation;Past/Current Experience;Comorbidity 3+    Comorbidities HTN, hyperlipidemia, scarcoidosis, IBS    Examination-Activity Limitations Squat;Stand    Examination-Participation Restrictions Community Activity;Cleaning;Meal Prep    Stability/Clinical Decision Making Evolving/Moderate complexity    Rehab Potential Good    PT Frequency 2x / week    PT Duration 8 weeks    PT Treatment/Interventions Canalith Repostioning;Gait training;Stair training;Therapeutic activities;Balance training;Therapeutic exercise;Neuromuscular re-education;Patient/family education;Vestibular;Functional mobility training;ADLs/Self Care Home Management;Aquatic Therapy;Biofeedback;Electrical Stimulation;Cryotherapy;Iontophoresis 61m/ml Dexamethasone;Moist Heat;Ultrasound;Traction;Manual techniques;Passive range of motion;Dry needling;Taping;Joint Manipulations;Other (comment)   Phonophoresis with 1% Diclofenac topical   PT Next Visit Plan Assess ROM and strength of digits, continue exercises/stretches, assess response to iontophoresis    PT Home Exercise Plan Access Code: [email or web address]   Consulted and Agree with Plan of Care Patient           Patient will benefit from skilled therapeutic intervention in order to improve the following deficits and impairments:  Pain,Abnormal gait,Difficulty walking  Visit Diagnosis: Pain in left foot     Problem List Patient Active Problem List   Diagnosis Date Noted  . Pure hypercholesterolemia 11/04/2016  . Essential hypertension 09/18/2015  . Hyperlipidemia 09/18/2015  . Recurrent genital herpes simplex 09/18/2015   JLyndel SafeHuprich PT, DPT, GCS  Cheyenne Bean 02/27/2021, 9:55 AM  [place] [facility] [place] [address]Phone: [phone]  Fax:   [phone] Name: Cheyenne CaraveoMRN: [id number] of Birth: 111-10-1956

## 2021-03-04 ENCOUNTER — Ambulatory Visit: Payer: Medicaid Other

## 2021-03-04 ENCOUNTER — Other Ambulatory Visit: Payer: Self-pay

## 2021-03-04 DIAGNOSIS — M79672 Pain in left foot: Secondary | ICD-10-CM

## 2021-03-04 NOTE — Therapy (Signed)
[facility] [address] [place], [place], [address] Phone: [phone]   Fax:  [phone]  Physical Therapy Treatment  Patient Details  Name: Cheyenne Bean MRN: [id number] Date of Birth: [birth year] Referring Provider (PT): Dr. Gardiner Fanti   Encounter Date: 03/04/2021   PT End of Session - 03/04/21 0940    Visit Number 8    Number of Visits 17    Date for PT Re-Evaluation 03/12/21    Authorization Type eval: 01/15/21, FOTO predicted visit count 14    PT Start Time 0933    PT Stop Time 1015    PT Time Calculation (min) 42 min    Activity Tolerance Patient tolerated treatment well;Patient limited by pain    Behavior During Therapy [facility] for tasks assessed/performed           Past Medical History:  Diagnosis Date  . BRCA negative 01/2017   MyRisk neg  . Family history of breast cancer    1/18 IBIS=10.7%  . Family history of ovarian cancer    MyRisk neg  . Genital herpes   . Hyperlipidemia   . Hypertension   . IBS (irritable bowel syndrome) 02/20/2017  . Sarcoidosis     Past Surgical History:  Procedure Laterality Date  . BACK SURGERY    . BUNIONECTOMY Left   . COLONOSCOPY  2012   cleared for 10 yrs  . COLONOSCOPY WITH PROPOFOL N/A 02/27/2017   Procedure: COLONOSCOPY WITH PROPOFOL;  Surgeon: Lollie Sails, MD;  Location: [facility] ENDOSCOPY;  Service: Endoscopy;  Laterality: N/A;  . VAGINAL HYSTERECTOMY      There were no vitals filed for this visit.   Subjective Assessment - 03/04/21 0934    Subjective Pt reports she is having 6/10 pain upon arrival today. She has been icing aggressively which is very helpful for her. She is unsure if the kinesiotape has been helping her foot or not. No specific questions or concerns upon arrival today.    Pertinent History Pt reports that approximately 1 year ago she started having L foot pain. She was wearing a lot of flats around that time when the pain started started. The  pain improved after approximately 1 week without any intervention. She was visiting her dad in October 2021 and her foot pain flared up significantly again. She was unable ot put weight on her foot due to the pain and had to use crutches to help her walk. She went to the urgent care and was sent to general orthopedics who recommended orthotics and gave her two steroid shots on her medial foot near her L posterior tibial tendon. She reports improvement for 1-2 days and then the pain returned. After returning to [place] she saw general orthopedics who placed a medial heel wedge in her shoes. He also prescribed her Voltaren gel for topical anti-inflammatory use to be used up to 4 times/day. 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 The pain is located on the medial aspect of her L foot at the insertion on the medial navicular and she describes it as "achy." No other recent changes in medication or health. History of chronic back pain with LLE radicular pain to the calf. Pt reports she has had back surgery without improvement but does not recall what type of surgery she had. Pt has a history of sarcoidosis but states that she had a breathing test yesterday which was good.    Limitations Walking;Standing  How long can you stand comfortably? 5 minutes    Diagnostic tests MRI    Patient Stated Goals Improve foot pain so she can walk comfortably    Currently in Pain? Yes    Pain Score 6     Pain Location Ankle    Pain Orientation Left    Pain Descriptors / Indicators Aching    Pain Type Chronic pain    Pain Onset More than a month ago               TREATMENT   Ther-ex Moist heat pack applied to L ankle x 5 minutes at start of session during interval history (2 minutes unbilled); Passive L ankle eversion stretch 45s hold x 2; Passive L ankle dorsiflexion stretch 45s x 2; L ankle eccentric inversion 2 x 10; Seatedtalar doming 3s hold 2 x 10; Standing Prostretch calf  stretch 45s x 3; Eccentric L heel lowering on step 2 x 10;   Manual Therapy L TC A/P mobilizations at available end range dorsiflexion, grade III, 30s/bout x3bouts; STM to L posterior tibialis and gastroc/soleus complex with occasional instrument assist; Transverse friction massage applied to tibialis posterior tendon posterior to medial malleolus x 2 minutes, no visible signs of skin irritation afterward;   Pt educated throughout session about proper posture and technique with exercises. Improved exercise technique, movement at target joints, use of target muscles after min to mod verbal, visual, tactile cues.   Pt reports that she has been icing aggressively which is helping with her pain. She was able to perform all exercises as instructed today but does report reproduction of her foot pain during eccentric heel lowering on step. Continued with manual techniques and included transverse friction massage to tibialis posterior tendon. Deferred kinesiotape today to see how she tolerates between sessions. Pt encouraged to continue HEP. Shewill benefit from PT services to address deficits in L foot pain in order to return to full pain-free function at home.                         PT Short Term Goals - 01/15/21 1552      PT SHORT TERM GOAL #1   Title Pt will be independent with HEP in order to decrease foot pain and increase strength/ROM in order to improve pain-free function at home.    Time 4    Period Weeks    Status New    Target Date 02/12/21             PT Long Term Goals - 01/15/21 1553      PT LONG TERM GOAL #1   Title Pt will decrease worst pain as reported on NPRS by at least 3 points in order to demonstrate clinically significant reduction in foot pain.    Baseline 01/15/21: worst: 10/10    Time 8    Period Weeks    Status New    Target Date 03/12/21      PT LONG TERM GOAL #2   Title Pt will increase LEFS by at least 9 points in order to  demonstrate significant improvement in lower extremity function.    Baseline 01/15/21: to be completed at next visit    Time 8    Period Weeks    Status New    Target Date 03/12/21      PT LONG TERM GOAL #3   Title Pt will improve painless L ankle active eversion to within 2 degrees  of R side in order to demonstrate improved ankle mobility with less pain    Baseline 01/15/21: R/L 18/12 Ankle Eversion (L ankle painful)    Time 8    Period Weeks    Status New    Target Date 03/12/21      PT LONG TERM GOAL #4   Title Pt will improve FOTO to at least 50 in order to demonstrate significant improvement in L ankle function    Baseline 01/15/21: 26    Time 8    Period Weeks    Status New    Target Date 03/12/21                 Plan - 03/04/21 0941    Clinical Impression Statement Pt reports that she has been icing aggressively which is helping with her pain. She was able to perform all exercises as instructed today but does report reproduction of her foot pain during eccentric heel lowering on step. Continued with manual techniques and included transverse friction massage to tibialis posterior tendon. Deferred kinesiotape today to see how she tolerates between sessions. Pt encouraged to continue HEP. She will benefit from PT services to address deficits in L foot pain in order to return to full pain-free function at home.    Personal Factors and Comorbidities Time since onset of injury/illness/exacerbation;Past/Current Experience;Comorbidity 3+    Comorbidities HTN, hyperlipidemia, scarcoidosis, IBS    Examination-Activity Limitations Squat;Stand    Examination-Participation Restrictions Community Activity;Cleaning;Meal Prep    Stability/Clinical Decision Making Evolving/Moderate complexity    Rehab Potential Good    PT Frequency 2x / week    PT Duration 8 weeks    PT Treatment/Interventions Canalith Repostioning;Gait training;Stair training;Therapeutic activities;Balance  training;Therapeutic exercise;Neuromuscular re-education;Patient/family education;Vestibular;Functional mobility training;ADLs/Self Care Home Management;Aquatic Therapy;Biofeedback;Electrical Stimulation;Cryotherapy;Iontophoresis 85m/ml Dexamethasone;Moist Heat;Ultrasound;Traction;Manual techniques;Passive range of motion;Dry needling;Taping;Joint Manipulations;Other (comment)   Phonophoresis with 1% Diclofenac topical   PT Next Visit Plan Assess ROM and strength of digits, continue exercises/stretches, assess response to iontophoresis    PT Home Exercise Plan Access Code: [email or web address]   Consulted and Agree with Plan of Care Patient           Patient will benefit from skilled therapeutic intervention in order to improve the following deficits and impairments:  Pain,Abnormal gait,Difficulty walking  Visit Diagnosis: Pain in left foot     Problem List Patient Active Problem List   Diagnosis Date Noted  . Pure hypercholesterolemia 11/04/2016  . Essential hypertension 09/18/2015  . Hyperlipidemia 09/18/2015  . Recurrent genital herpes simplex 09/18/2015   JLyndel SafeHuprich PT, DPT, GCS  Dimitri Dsouza 03/04/2021, 3:21 PM  [place] [facility] [place] [address]Phone: [phone]  Fax:  [phone] Name: AKaytee TaliercioMRN: [id number] of Birth: [birth year]

## 2021-03-05 ENCOUNTER — Telehealth: Payer: Self-pay

## 2021-03-05 ENCOUNTER — Other Ambulatory Visit: Payer: Self-pay | Admitting: Family Medicine

## 2021-03-05 DIAGNOSIS — A6000 Herpesviral infection of urogenital system, unspecified: Secondary | ICD-10-CM

## 2021-03-05 NOTE — Telephone Encounter (Unsigned)
Copied from CRM [id number]. Topic: Referral - Request for Referral >> Mar 05, 2021  9:06 AM Gaetana Michaelis A wrote: Has patient seen PCP for this complaint? No  *If NO, is insurance requiring patient see PCP for this issue before PCP can refer them?  Referral for which specialty: Dermatology  Preferred provider/office: [facility] / no spec provider  Reason for referral: Facial blemishes, moles on neck  Patient has provided fax number for [facility] as well [phone]

## 2021-03-05 NOTE — Telephone Encounter (Signed)
May schedule appt to come in for eval then referral to derm. She may try to call [phone] and see if they will give her appt without referral. If not, schedule to see [place] first

## 2021-03-05 NOTE — Telephone Encounter (Signed)
Pt scheduled appt

## 2021-03-06 ENCOUNTER — Encounter: Payer: Self-pay | Admitting: Family Medicine

## 2021-03-06 ENCOUNTER — Ambulatory Visit (INDEPENDENT_AMBULATORY_CARE_PROVIDER_SITE_OTHER): Payer: Medicaid Other | Admitting: Family Medicine

## 2021-03-06 ENCOUNTER — Other Ambulatory Visit: Payer: Self-pay

## 2021-03-06 ENCOUNTER — Ambulatory Visit: Payer: Medicaid Other

## 2021-03-06 VITALS — BP 120/80 | HR 98 | Ht 64.0 in | Wt 155.0 lb

## 2021-03-06 DIAGNOSIS — M79672 Pain in left foot: Secondary | ICD-10-CM

## 2021-03-06 DIAGNOSIS — L700 Acne vulgaris: Secondary | ICD-10-CM

## 2021-03-06 DIAGNOSIS — L918 Other hypertrophic disorders of the skin: Secondary | ICD-10-CM | POA: Diagnosis not present

## 2021-03-06 DIAGNOSIS — R2681 Unsteadiness on feet: Secondary | ICD-10-CM

## 2021-03-06 DIAGNOSIS — R42 Dizziness and giddiness: Secondary | ICD-10-CM

## 2021-03-06 NOTE — Therapy (Signed)
[facility] [address] [place], [place], [address] Phone: [phone]   Fax:  [phone]  Physical Therapy Treatment  Patient Details  Name: Cheyenne Bean MRN: [id number] Date of Birth: Apr 21, 1956 Referring Provider (PT): Dr. Gardiner Fanti   Encounter Date: 03/06/2021   PT End of Session - 03/06/21 0850    Visit Number 9    Number of Visits 17    Date for PT Re-Evaluation 03/12/21    Authorization Type eval: 01/15/21, FOTO predicted visit count 14    PT Start Time 0845    PT Stop Time 0930    PT Time Calculation (min) 45 min    Activity Tolerance Patient tolerated treatment well;Patient limited by pain    Behavior During Therapy [facility] for tasks assessed/performed           Past Medical History:  Diagnosis Date  . BRCA negative 01/2017   MyRisk neg  . Family history of breast cancer    1/18 IBIS=10.7%  . Family history of ovarian cancer    MyRisk neg  . Genital herpes   . Hyperlipidemia   . Hypertension   . IBS (irritable bowel syndrome) 02/20/2017  . Sarcoidosis     Past Surgical History:  Procedure Laterality Date  . BACK SURGERY    . BUNIONECTOMY Left   . COLONOSCOPY  2012   cleared for 10 yrs  . COLONOSCOPY WITH PROPOFOL N/A 02/27/2017   Procedure: COLONOSCOPY WITH PROPOFOL;  Surgeon: Lollie Sails, MD;  Location: [facility] ENDOSCOPY;  Service: Endoscopy;  Laterality: N/A;  . VAGINAL HYSTERECTOMY      There were no vitals filed for this visit.   Subjective Assessment - 03/06/21 0848    Subjective Patient reported that after therapy she will sometimes hurt or feel a little better depending on the day, but when she wakes up in the morning it backs to being its normal pain level. Defintely more painful after a long walk.    Pertinent History Pt reports that approximately 1 year ago she started having L foot pain. She was wearing a lot of flats around that time when the pain started started. The pain  improved after approximately 1 week without any intervention. She was visiting her dad in October 2021 and her foot pain flared up significantly again. She was unable ot put weight on her foot due to the pain and had to use crutches to help her walk. She went to the urgent care and was sent to general orthopedics who recommended orthotics and gave her two steroid shots on her medial foot near her L posterior tibial tendon. She reports improvement for 1-2 days and then the pain returned. After returning to [place] she saw general orthopedics who placed a medial heel wedge in her shoes. He also prescribed her Voltaren gel for topical anti-inflammatory use to be used up to 4 times/day. 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 The pain is located on the medial aspect of her L foot at the insertion on the medial navicular and she describes it as "achy." No other recent changes in medication or health. History of chronic back pain with LLE radicular pain to the calf. Pt reports she has had back surgery without improvement but does not recall what type of surgery she had. Pt has a history of sarcoidosis but states that she had a breathing test yesterday which was good.    Limitations Walking;Standing  How long can you stand comfortably? 5 minutes    Diagnostic tests MRI    Patient Stated Goals Improve foot pain so she can walk comfortably    Currently in Pain? Yes    Pain Score 7     Pain Location Ankle    Pain Orientation Left    Pain Descriptors / Indicators Aching    Pain Type Chronic pain    Pain Onset More than a month ago           TREATMENT     Ther-ex   Moist heat pack applied to L ankle x 5 minutes at start of session during interval history (2 minutes unbilled); Passive L ankle eversion stretch 45s hold x 2; Passive L ankle dorsiflexion stretch 45s x 2; L ankle eccentric inversion 2 x 10; Seated talar doming 3s hold 2 x 10; Standing Prostretch calf stretch 45s x  3; Eccentric L heel lowering on step 2 x 10;     Manual Therapy  L TC A/P mobilizations at available end range dorsiflexion, grade III, 30s/bout x 3 bouts; STM to L posterior tibialis and gastroc/soleus complex with occasional instrument assist; Transverse friction massage applied to tibialis posterior tendon posterior to medial malleolus x 2 minutes, no visible signs of skin irritation afterward;     Pt educated throughout session about proper posture and technique with exercises. Improved exercise technique, movement at target joints, use of target muscles after min to mod verbal, visual, tactile cues.    pt response/clinical impression: pt reported no pain with manual therapy today, most sensitive with eccentric heel lowering. Educated about importance of eccentric exercises and pt condition, verbalized understanding. The patient was able complete all exercises as planned without increase in pain at end of session. The patient would benefit from further skilled PT intervention to continue to progress towards goals.       PT Education - 03/06/21 0849    Education Details therex    Person(s) Educated Patient    Methods Explanation;Handout    Comprehension Verbalized understanding            PT Short Term Goals - 01/15/21 1552      PT SHORT TERM GOAL #1   Title Pt will be independent with HEP in order to decrease foot pain and increase strength/ROM in order to improve pain-free function at home.    Time 4    Period Weeks    Status New    Target Date 02/12/21             PT Long Term Goals - 01/15/21 1553      PT LONG TERM GOAL #1   Title Pt will decrease worst pain as reported on NPRS by at least 3 points in order to demonstrate clinically significant reduction in foot pain.    Baseline 01/15/21: worst: 10/10    Time 8    Period Weeks    Status New    Target Date 03/12/21      PT LONG TERM GOAL #2   Title Pt will increase LEFS by at least 9 points in order to  demonstrate significant improvement in lower extremity function.    Baseline 01/15/21: to be completed at next visit    Time 8    Period Weeks    Status New    Target Date 03/12/21      PT LONG TERM GOAL #3   Title Pt will improve painless L ankle active eversion to within  2 degrees of R side in order to demonstrate improved ankle mobility with less pain    Baseline 01/15/21: R/L 18/12 Ankle Eversion (L ankle painful)    Time 8    Period Weeks    Status New    Target Date 03/12/21      PT LONG TERM GOAL #4   Title Pt will improve FOTO to at least 50 in order to demonstrate significant improvement in L ankle function    Baseline 01/15/21: 26    Time 8    Period Weeks    Status New    Target Date 03/12/21                 Plan - 03/06/21 0850    Clinical Impression Statement pt reported no pain with manual therapy today, most sensitive with eccentric heel lowering. Educated about importance of eccentric exercises and pt condition, verbalized understanding. The patient was able complete all exercises as planned without increase in pain at end of session. The patient would benefit from further skilled PT intervention to continue to progress towards goals.    Personal Factors and Comorbidities Time since onset of injury/illness/exacerbation;Past/Current Experience;Comorbidity 3+    Comorbidities HTN, hyperlipidemia, scarcoidosis, IBS    Examination-Activity Limitations Squat;Stand    Examination-Participation Restrictions Community Activity;Cleaning;Meal Prep    Stability/Clinical Decision Making Evolving/Moderate complexity    Rehab Potential Good    PT Frequency 2x / week    PT Duration 8 weeks    PT Treatment/Interventions Canalith Repostioning;Gait training;Stair training;Therapeutic activities;Balance training;Therapeutic exercise;Neuromuscular re-education;Patient/family education;Vestibular;Functional mobility training;ADLs/Self Care Home Management;Aquatic  Therapy;Biofeedback;Electrical Stimulation;Cryotherapy;Iontophoresis 51m/ml Dexamethasone;Moist Heat;Ultrasound;Traction;Manual techniques;Passive range of motion;Dry needling;Taping;Joint Manipulations;Other (comment)   phonophoresis   PT Next Visit Plan Assess ROM and strength of digits, continue exercises/stretches, assess response to iontophoresis    PT Home Exercise Plan Access Code: [email or web address]   Consulted and Agree with Plan of Care Patient           Patient will benefit from skilled therapeutic intervention in order to improve the following deficits and impairments:  Pain,Abnormal gait,Difficulty walking  Visit Diagnosis: Pain in left foot  Dizziness and giddiness  Unsteadiness on feet     Problem List Patient Active Problem List   Diagnosis Date Noted  . Pure hypercholesterolemia 11/04/2016  . Essential hypertension 09/18/2015  . Hyperlipidemia 09/18/2015  . Recurrent genital herpes simplex 09/18/2015    DLieutenant DiegoPT, DPT 10:40 AM,03/06/21   [place] [facility] [place] [address]Phone: [phone]  Fax:  [phone] Name: ADoristine ShehanMRN: [id number] of Birth: [birth year]

## 2021-03-06 NOTE — Progress Notes (Signed)
Date:  03/06/2021   Name:  Cheyenne Bean   DOB:  [birth year]   MRN:  [id number]   Chief Complaint: Nevus (Pt has moles and places on skin that she would like to have checked by Dr Cheree Ditto)  HPI  Lab Results  Component Value Date   CREATININE 0.80 12/13/2020   BUN 18 12/13/2020   NA 139 12/13/2020   K 3.7 12/13/2020   CL 104 12/13/2020   CO2 28 12/13/2020   Lab Results  Component Value Date   CHOL 170 04/03/2020   HDL 45 04/03/2020   LDLCALC 112 (H) 04/03/2020   TRIG 65 04/03/2020   CHOLHDL 3.2 03/26/2018   No results found for: TSH No results found for: HGBA1C Lab Results  Component Value Date   WBC 5.5 12/13/2020   HGB 14.4 12/13/2020   HCT 43.3 12/13/2020   MCV 87.1 12/13/2020   PLT 199 12/13/2020   Lab Results  Component Value Date   ALT 24 08/05/2019   AST 21 08/05/2019   ALKPHOS 101 08/05/2019   BILITOT 0.3 08/05/2019     Review of Systems  Constitutional: Negative.  Negative for chills, fatigue, fever and unexpected weight change.  HENT: Negative for congestion, ear discharge, ear pain, rhinorrhea, sinus pressure, sneezing and sore throat.   Eyes: Negative for photophobia, pain, discharge, redness and itching.  Respiratory: Negative for cough, shortness of breath, wheezing and stridor.   Gastrointestinal: Negative for abdominal pain, blood in stool, constipation, diarrhea, nausea and vomiting.  Endocrine: Negative for cold intolerance, heat intolerance, polydipsia, polyphagia and polyuria.  Genitourinary: Negative for dysuria, flank pain, frequency, hematuria, menstrual problem, pelvic pain, urgency, vaginal bleeding and vaginal discharge.  Musculoskeletal: Negative for arthralgias, back pain and myalgias.  Skin: Negative for rash.  Allergic/Immunologic: Negative for environmental allergies and food allergies.  Neurological: Negative for dizziness, weakness, light-headedness, numbness and headaches.  Hematological: Negative for adenopathy. Does  not bruise/bleed easily.  Psychiatric/Behavioral: Negative for dysphoric mood. The patient is not nervous/anxious.     Patient Active Problem List   Diagnosis Date Noted  . Pure hypercholesterolemia 11/04/2016  . Essential hypertension 09/18/2015  . Hyperlipidemia 09/18/2015  . Recurrent genital herpes simplex 09/18/2015    No Known Allergies  Past Surgical History:  Procedure Laterality Date  . BACK SURGERY    . BUNIONECTOMY Left   . COLONOSCOPY  2012   cleared for 10 yrs  . COLONOSCOPY WITH PROPOFOL N/A 02/27/2017   Procedure: COLONOSCOPY WITH PROPOFOL;  Surgeon: Christena Deem, MD;  Location: [facility] ENDOSCOPY;  Service: Endoscopy;  Laterality: N/A;  . VAGINAL HYSTERECTOMY      Social History   Tobacco Use  . Smoking status: Never Smoker  . Smokeless tobacco: Never Used  Vaping Use  . Vaping Use: Never used  Substance Use Topics  . Alcohol use: No    Alcohol/week: 0.0 standard drinks  . Drug use: No     Medication list has been reviewed and updated.  Current Meds  Medication Sig  . amLODipine (NORVASC) 5 MG tablet Take 1 tablet by mouth daily. Gwen Pounds  . diclofenac sodium (VOLTAREN) 1 % GEL Apply 2 g topically 4 (four) times daily.  . famotidine (PEPCID) 20 MG tablet Take 1 tablet (20 mg total) by mouth 2 (two) times daily.  Marland Kitchen FLUoxetine (PROZAC) 40 MG capsule Take 1 capsule by mouth daily. Behavioral health  . pravastatin (PRAVACHOL) 20 MG tablet Take 1 tablet by mouth once daily  . valACYclovir (  VALTREX) 500 MG tablet TAKE 1 TABLET BY MOUTH ONCE DAILY. APPOINTMENT NEEDED    PHQ 2/9 Scores 03/06/2021 11/28/2020 04/03/2020 08/05/2019  PHQ - 2 Score 0 0 2 0  PHQ- 9 Score 0 0 2 0    GAD 7 : Generalized Anxiety Score 11/28/2020 04/03/2020  Nervous, Anxious, on Edge 0 0  Control/stop worrying 0 0  Worry too much - different things 0 0  Trouble relaxing 0 0  Restless 0 0  Easily annoyed or irritable 0 0  Afraid - awful might happen 0 0  Total GAD 7 Score 0 0     BP Readings from Last 3 Encounters:  03/06/21 120/80  12/13/20 (!) 150/84  11/28/20 (!) 140/100    Physical Exam Vitals and nursing note reviewed.  Constitutional:      Appearance: She is well-developed.  HENT:     Head: Normocephalic.     Right Ear: Tympanic membrane, ear canal and external ear normal. There is no impacted cerumen.     Left Ear: Tympanic membrane, ear canal and external ear normal. There is no impacted cerumen.     Nose: Nose normal.     Mouth/Throat:     Mouth: Mucous membranes are moist.  Eyes:     General: Lids are everted, no foreign bodies appreciated. No scleral icterus.       Left eye: No foreign body or hordeolum.     Conjunctiva/sclera: Conjunctivae normal.     Right eye: Right conjunctiva is not injected.     Left eye: Left conjunctiva is not injected.     Pupils: Pupils are equal, round, and reactive to light.  Neck:     Thyroid: No thyromegaly.     Vascular: No JVD.     Trachea: No tracheal deviation.  Cardiovascular:     Rate and Rhythm: Normal rate and regular rhythm.     Heart sounds: Normal heart sounds. No murmur heard. No friction rub. No gallop.   Pulmonary:     Effort: Pulmonary effort is normal. No respiratory distress.     Breath sounds: Normal breath sounds. No stridor. No wheezing, rhonchi or rales.  Chest:     Chest wall: No tenderness.  Abdominal:     General: Bowel sounds are normal.     Palpations: Abdomen is soft. There is no mass.     Tenderness: There is no abdominal tenderness. There is no guarding or rebound.  Musculoskeletal:        General: No tenderness. Normal range of motion.     Cervical back: Normal range of motion and neck supple.  Lymphadenopathy:     Cervical: No cervical adenopathy.  Skin:    General: Skin is warm.     Capillary Refill: Capillary refill takes less than 2 seconds.     Findings: No rash.  Neurological:     Mental Status: She is alert and oriented to person, place, and time.      Cranial Nerves: No cranial nerve deficit.     Deep Tendon Reflexes: Reflexes normal.  Psychiatric:        Mood and Affect: Mood is not anxious or depressed.     Wt Readings from Last 3 Encounters:  03/06/21 155 lb (70.3 kg)  12/13/20 160 lb (72.6 kg)  11/28/20 159 lb (72.1 kg)    BP 120/80   Pulse 98   Ht 5\' 4"  (1.626 m)   Wt 155 lb (70.3 kg)   BMI 26.61 kg/m  Assessment and Plan:  1. Acne vulgaris Chronic.  Persistent.  Patient has lingering issues with comedones.  She would like a referral to dermatology and we will proceed with referral to Dr. Cheree Ditto. - Ambulatory referral to Dermatology  2. Acrochordon Patient has multiple skin tags in the neck area that she would like removed and we will refer to dermatology for evaluation and likely removal. - Ambulatory referral to Dermatology

## 2021-03-06 NOTE — Patient Instructions (Signed)
Skin Tag, Adult  A skin tag (acrochordon) is a soft, extra growth of skin. Most skin tags are skin-colored and rarely bigger than a pencil eraser. They commonly form in areas where there is frequent rubbing, or friction, on the skin. This may be where there are folds in the skin, such as the eyelids, neck, armpit, or groin. Skin tags are not dangerous, and they do not spread from person to person (are not contagious). You may have one skin tag or several. Skin tags do not require treatment. However, your health care provider may recommend removal of a skin tag if it:  Gets irritated from clothing or jewelry.  Bleeds.  Is visible and unsightly. What are the causes? This condition is linked with:  Increasing age.  Pregnancy.  Diabetes.  Obesity. What are the signs or symptoms? Skin tags usually do not cause symptoms unless they get irritated by items touching your skin, such as clothing or jewelry. When this happens, you may have pain, itching, or bleeding. How is this diagnosed? This condition is diagnosed with an evaluation from your health care provider. No testing is needed for diagnosis. How is this treated? Treatment for this condition depends on whether you have symptoms. If a skin tag needs to be removed, your health care provider can remove it with:  A simple surgical procedure using scissors.  A procedure that involves freezing your skin tag with a gas in liquid form (liquid nitrogen).  A procedure that uses heat to destroy your skin tag (electrodessication). Your health care provider may also remove your skin tag if it is visible or unsightly, Follow these instructions at home:  Watch for any changes in your skin tag. A normal skin tag does not require any other special care at home.  Take over-the-counter and prescription medicines only as told by your health care provider.  Keep all follow-up visits as told by your health care provider. This is important. Contact a  health care provider if:  You have a skin tag that: ? Becomes painful. ? Changes color. ? Bleeds. ? Swells. Summary  Skin tags are soft, extra growths of skin found in areas of frequent rubbing or friction.  Skin tags usually do not cause symptoms. If symptoms occur, you may have pain, itching, or bleeding.  If your skin tag causes symptoms or is unsightly, your health care provider can remove it. This information is not intended to replace advice given to you by your health care provider. Make sure you discuss any questions you have with your health care provider. Document Revised: 10/10/2019 Document Reviewed: 10/10/2019 Elsevier Patient Education  2021 Elsevier Inc.  

## 2021-03-11 ENCOUNTER — Other Ambulatory Visit: Payer: Self-pay

## 2021-03-11 ENCOUNTER — Ambulatory Visit: Payer: Medicaid Other

## 2021-03-11 DIAGNOSIS — M79672 Pain in left foot: Secondary | ICD-10-CM | POA: Diagnosis not present

## 2021-03-11 NOTE — Therapy (Signed)
[facility] [address]. [place], [place], [address] Phone: [phone]   Fax:  [phone]  Physical Therapy Progress Note/Recertification   Dates of reporting period  01/15/21   to   03/11/21  Patient Details  Name: Lamiah Marmol MRN: [id number] Date of Birth: June 06, 1956 Referring Provider (PT): Dr. Gardiner Fanti   Encounter Date: 03/11/2021   PT End of Session - 03/11/21 0857    Visit Number 10    Number of Visits 33    Date for PT Re-Evaluation 05/06/21    Authorization Type eval: 1/59/47, PN(and recert): 0/76/15; FOTO predicted visit count 14    PT Start Time 0850    PT Stop Time 0945    PT Time Calculation (min) 55 min    Activity Tolerance Patient tolerated treatment well;Patient limited by pain    Behavior During Therapy [facility] for tasks assessed/performed           Past Medical History:  Diagnosis Date  . BRCA negative 01/2017   MyRisk neg  . Family history of breast cancer    1/18 IBIS=10.7%  . Family history of ovarian cancer    MyRisk neg  . Genital herpes   . Hyperlipidemia   . Hypertension   . IBS (irritable bowel syndrome) 02/20/2017  . Sarcoidosis     Past Surgical History:  Procedure Laterality Date  . BACK SURGERY    . BUNIONECTOMY Left   . COLONOSCOPY  2012   cleared for 10 yrs  . COLONOSCOPY WITH PROPOFOL N/A 02/27/2017   Procedure: COLONOSCOPY WITH PROPOFOL;  Surgeon: Lollie Sails, MD;  Location: [facility] ENDOSCOPY;  Service: Endoscopy;  Laterality: N/A;  . VAGINAL HYSTERECTOMY      There were no vitals filed for this visit.   Subjective Assessment - 03/11/21 0856    Subjective Patient reports that she is doing well today.  Her left ankle/foot has been more aggravated recently and she reports 8/10 upon arrival.  No specific questions upon arrival.    Pertinent History Pt reports that approximately 1 year ago she started having L foot pain. She was wearing a lot of flats around that time  when the pain started started. The pain improved after approximately 1 week without any intervention. She was visiting her dad in October 2021 and her foot pain flared up significantly again. She was unable ot put weight on her foot due to the pain and had to use crutches to help her walk. She went to the urgent care and was sent to general orthopedics who recommended orthotics and gave her two steroid shots on her medial foot near her L posterior tibial tendon. She reports improvement for 1-2 days and then the pain returned. After returning to [place] she saw general orthopedics who placed a medial heel wedge in her shoes. He also prescribed her Voltaren gel for topical anti-inflammatory use to be used up to 4 times/day. 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 The pain is located on the medial aspect of her L foot at the insertion on the medial navicular and she describes it as "achy." No other recent changes in medication or health. History of chronic back pain with LLE radicular pain to the calf. Pt reports she has had back surgery without improvement but does not recall what type of surgery she had. Pt has a history of sarcoidosis but states that she had a breathing test yesterday which was good.  Limitations Walking;Standing    How long can you stand comfortably? 5 minutes    Diagnostic tests MRI    Patient Stated Goals Improve foot pain so she can walk comfortably    Currently in Pain? Yes    Pain Score 8     Pain Location Ankle    Pain Orientation Left    Pain Descriptors / Indicators Aching    Pain Type Chronic pain    Pain Onset More than a month ago    Pain Frequency Intermittent              TREATMENT     Ther-ex   NuStep L2 x 5 minutes for warm-up during history (4 minutes unbilled); Moist heat pack applied to L ankle x 5 minutes after NuStep during goal review (2 minutes unbilled); Passive L ankle eversion stretch 45s hold x 2; Passive L ankle  dorsiflexion stretch in longsitting with knee straight 45s x 2; L ankle eccentric inversion in longsitting with resistance from therapist x 10; Standing Prostretch calf stretch 45s x 3; Standing eccentric L heel lowering on step x 10;     Manual Therapy  L TC A/P mobilizations at available end range dorsiflexion, grade III, 30s/bout x 3 bouts; L TC distraction 30s/bout x 3 bouts; Navicular and cuboid superior to inferior mobilizations, grade II-III, 30s/bout x 2 bouts each; Transverse friction massage applied to tibialis posterior tendon posterior to medial malleolus in longsitting to improve tissue extensibility x 2 minutes, no visible signs of skin irritation afterward however pt does report discomfort during; Updated goals with patient including worst pain, LEFS, and FOTO, deferred range of motion measures to next session; Kinesiotape applied to L foot/ankle with 3 strips (one long strip from met heads around heel and up calf, two short strips from lateral aspect of foot to medial supporting the transverse arch);     Pt educated throughout session about proper posture and technique with exercises. Improved exercise technique, movement at target joints, use of target muscles after min to mod verbal, visual, tactile cues.    Pt reports continued discomfort/pain in L foot however her worst pain has improved from 10/10 when first starting therapy to 8/10 today. Her FOTO score has improved from 26 initially to 29 today. LEFS score of 21/80 today.  Overall she is made progress in starting therapy however continues to have significant and irritable pain in foot/ankle.   We have attempted some trigger point dry needling in her posterior tibialis muscle however it is quite uncomfortable for patient with minimal benefit.Performed manual techniques today during session today as well asLfoot/ankle strengtheningincluding eccentric L ankle inversion and eccentric heel lowering. Pt reports that she is able  to feel pulling along her posterior tib tendon during eccentric heel lowering. Applied kinesiotape again to help provide support to L foot and ankle. Pt encouraged to continue HEP. Patient's condition has the potential to improve in response to therapy. Maximum improvement is yet to be obtained. The anticipated improvement is attainable and reasonable in a generally predictable time. Shewill benefit from PT services to address deficits in L foot/ankle pain in order to return to full pain-free function at home.                            PT Short Term Goals - 03/11/21 1012      PT SHORT TERM GOAL #1   Title Pt will be independent with HEP in order  to decrease foot pain and increase strength/ROM in order to improve pain-free function at home.    Time 4    Period Weeks    Status On-going    Target Date 04/08/21             PT Long Term Goals - 03/11/21 0918      PT LONG TERM GOAL #1   Title Pt will decrease worst pain as reported on NPRS by at least 3 points in order to demonstrate clinically significant reduction in foot pain.    Baseline 01/15/21: worst: 10/10, 03/11/21: 8/10    Time 8    Period Weeks    Status Partially Met    Target Date 05/06/21      PT LONG TERM GOAL #2   Title Pt will increase LEFS by at least 9 points in order to demonstrate significant improvement in lower extremity function.    Baseline 01/15/21: to be completed at next visit; 03/11/21: 21/80    Time 8    Period Weeks    Status On-going    Target Date 05/06/21      PT LONG TERM GOAL #3   Title Pt will improve painless L ankle active eversion to within 2 degrees of R side in order to demonstrate improved ankle mobility with less pain    Baseline 01/15/21: R/L 18/12 Ankle Eversion (L ankle painful); 03/11/21: Deferred to next session    Time 8    Period Weeks    Status Deferred    Target Date 05/06/21      PT LONG TERM GOAL #4   Title Pt will improve FOTO to at least 50 in order to  demonstrate significant improvement in L ankle function    Baseline 01/15/21: 26; 03/11/21: 29    Time 8    Period Weeks    Status Partially Met    Target Date 05/06/21                 Plan - 03/11/21 0858    Clinical Impression Statement Pt reports continued discomfort/pain in L foot however her worst pain has improved from 10/10 when first starting therapy to 8/10 today. Her FOTO score has improved from 26 initially to 29 today. LEFS score of 21/80 today.  Overall she is made progress in starting therapy however continues to have significant and irritable pain in foot/ankle.   We have attempted some trigger point dry needling in her posterior tibialis muscle however it is quite uncomfortable for patient with minimal benefit. Performed manual techniques today during session today as well as L foot/ankle strengthening including eccentric L ankle inversion and eccentric heel lowering. Pt reports that she is able to feel pulling along her posterior tib tendon during eccentric heel lowering. Applied kinesiotape again to help provide support to L foot and ankle. Pt encouraged to continue HEP. Patient's condition has the potential to improve in response to therapy. Maximum improvement is yet to be obtained. The anticipated improvement is attainable and reasonable in a generally predictable time. She will benefit from PT services to address deficits in L foot/ankle pain in order to return to full pain-free function at home.    Personal Factors and Comorbidities Time since onset of injury/illness/exacerbation;Past/Current Experience;Comorbidity 3+    Comorbidities HTN, hyperlipidemia, scarcoidosis, IBS    Examination-Activity Limitations Squat;Stand    Examination-Participation Restrictions Community Activity;Cleaning;Meal Prep    Stability/Clinical Decision Making Evolving/Moderate complexity    Rehab Potential Good    PT Frequency  2x / week    PT Duration 8 weeks    PT Treatment/Interventions  Canalith Repostioning;Gait training;Stair training;Therapeutic activities;Balance training;Therapeutic exercise;Neuromuscular re-education;Patient/family education;Vestibular;Functional mobility training;ADLs/Self Care Home Management;Aquatic Therapy;Biofeedback;Electrical Stimulation;Cryotherapy;Iontophoresis 23m/ml Dexamethasone;Moist Heat;Ultrasound;Traction;Manual techniques;Passive range of motion;Dry needling;Taping;Joint Manipulations;Other (comment)   phonophoresis   PT Next Visit Plan Assess ROM and strength of digits, continue exercises/stretches, assess response to iontophoresis    PT Home Exercise Plan Access Code: [email or web address]   Consulted and Agree with Plan of Care Patient           Patient will benefit from skilled therapeutic intervention in order to improve the following deficits and impairments:  Pain,Abnormal gait,Difficulty walking  Visit Diagnosis: Pain in left foot     Problem List Patient Active Problem List   Diagnosis Date Noted  . Pure hypercholesterolemia 11/04/2016  . Essential hypertension 09/18/2015  . Hyperlipidemia 09/18/2015  . Recurrent genital herpes simplex 09/18/2015   JLyndel SafeHuprich PT, DPT, GCS  Jaretssi Kraker 03/11/2021, 10:23 AM  [place] [facility] [place] [place] [id number]: [phone]  Fax:  [phone] Name: AAnissia WessellsMRN: [id number] of Birth: [birth year]

## 2021-03-13 NOTE — Patient Instructions (Incomplete)
TREATMENT   Ther-ex NuStep L2 x 5 minutes for warm-up during history (4 minutes unbilled); Moist heat pack applied to L ankle x24mnutes after NuStep during goal review (2 minutes unbilled); Passive L ankle eversion stretch 45s hold x2; Passive L ankle dorsiflexion stretch in longsitting with knee straight 45sx 2; L ankle eccentric inversion in longsitting with resistance from therapistx 10; Standing Prostretch calf stretch 45s x 3; Standing eccentric Lheelloweringon stepx 10;   Manual Therapy L TC A/P mobilizations at available end range dorsiflexion, grade III, 30s/bout x3bouts; L TC distraction 30s/bout x 3 bouts; Navicular and cuboid superior to inferior mobilizations, grade II-III, 30s/bout x 2 bouts each; Transverse friction massage applied to tibialis posterior tendon posterior to medial malleolus in longsitting to improve tissue extensibility x 2 minutes, no visible signs of skin irritationafterward however pt does report discomfort during; Updated goals with patient including worst pain, LEFS, and FOTO, deferred range of motion measures to next session; Kinesiotape applied to L foot/ankle with 3 strips (one long strip from met heads around heel and up calf, two short strips from lateral aspect of foot to medial supporting the transverse arch);   Pt educated throughout session about proper posture and technique with exercises. Improved exercise technique, movement at target joints, use of target muscles after min to mod verbal, visual, tactile cues.   Pt reports that she has been icing aggressively which is helping with her pain. She was able to perform all exercises as instructed today but does report reproduction of her foot pain during eccentric heel lowering on step. Continued with manual techniques and included transverse friction massage to tibialis posterior tendon. Deferred kinesiotape today to see how she tolerates between sessions.Pt encouraged to  continue HEP. Shewill benefit from PT services to address deficits in L foot pain in order to return to full pain-free function at home.   Pt reports continued discomfort/pain in L foot however her worst pain has improved from 10/10 when first starting therapy to 8/10 today. Her FOTO score has improved from 26 initially to 29 today. LEFS score of 21/80 today.  Overall she is made progress in starting therapy however continues to have significant and irritable pain in foot/ankle.  We have attempted some trigger point dry needling in her posterior tibialis muscle however it is quite uncomfortable for patient with minimal benefit.Performed manual techniques today during session today as well asLfoot/ankle strengtheningincludingeccentric L ankle inversionand eccentric heel lowering. Pt reports that she is able to feel pulling along her posterior tib tendon during eccentric heel lowering.Applied kinesiotape againto help provide support to L foot and ankle.Pt encouraged to continue HEP. Patient's condition has the potential to improve in response to therapy. Maximum improvement is yet to be obtained. The anticipated improvement is attainable and reasonable in a generally predictable time. Shewill benefit from PT services to address deficits in L foot/ankle pain in order to return to full pain-free function at home.

## 2021-03-14 ENCOUNTER — Other Ambulatory Visit: Payer: Self-pay

## 2021-03-14 ENCOUNTER — Ambulatory Visit: Payer: Medicaid Other

## 2021-03-14 DIAGNOSIS — M79672 Pain in left foot: Secondary | ICD-10-CM | POA: Diagnosis not present

## 2021-03-14 NOTE — Therapy (Signed)
[facility] [address]. [place], [place], [address] Phone: [phone]   Fax:  [phone]  Physical Therapy Treatment  Patient Details  Name: Cheyenne Bean MRN: [id number] Date of Birth: [birth year] Referring Provider (PT): Dr. Gardiner Fanti   Encounter Date: 03/14/2021   PT End of Session - 03/14/21 0856    Visit Number 11    Number of Visits 33    Date for PT Re-Evaluation 05/06/21    Authorization Type eval: 0/10/27, PN(and recert): 2/53/66; FOTO predicted visit count 14    PT Start Time 0847    PT Stop Time 0930    PT Time Calculation (min) 43 min    Activity Tolerance Patient tolerated treatment well;Patient limited by pain    Behavior During Therapy [facility] for tasks assessed/performed           Past Medical History:  Diagnosis Date  . BRCA negative 01/2017   MyRisk neg  . Family history of breast cancer    1/18 IBIS=10.7%  . Family history of ovarian cancer    MyRisk neg  . Genital herpes   . Hyperlipidemia   . Hypertension   . IBS (irritable bowel syndrome) 02/20/2017  . Sarcoidosis     Past Surgical History:  Procedure Laterality Date  . BACK SURGERY    . BUNIONECTOMY Left   . COLONOSCOPY  2012   cleared for 10 yrs  . COLONOSCOPY WITH PROPOFOL N/A 02/27/2017   Procedure: COLONOSCOPY WITH PROPOFOL;  Surgeon: Lollie Sails, MD;  Location: [facility] ENDOSCOPY;  Service: Endoscopy;  Laterality: N/A;  . VAGINAL HYSTERECTOMY      There were no vitals filed for this visit.   Subjective Assessment - 03/14/21 0855    Subjective Patient reports that she is doing well today.  Her left ankle/foot has been improving and she reports 6/10 upon arrival.  No specific questions upon arrival.    Pertinent History Pt reports that approximately 1 year ago she started having L foot pain. She was wearing a lot of flats around that time when the pain started started. The pain improved after approximately 1 week without any  intervention. She was visiting her dad in October 2021 and her foot pain flared up significantly again. She was unable ot put weight on her foot due to the pain and had to use crutches to help her walk. She went to the urgent care and was sent to general orthopedics who recommended orthotics and gave her two steroid shots on her medial foot near her L posterior tibial tendon. She reports improvement for 1-2 days and then the pain returned. After returning to [place] she saw general orthopedics who placed a medial heel wedge in her shoes. He also prescribed her Voltaren gel for topical anti-inflammatory use to be used up to 4 times/day. 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 The pain is located on the medial aspect of her L foot at the insertion on the medial navicular and she describes it as "achy." No other recent changes in medication or health. History of chronic back pain with LLE radicular pain to the calf. Pt reports she has had back surgery without improvement but does not recall what type of surgery she had. Pt has a history of sarcoidosis but states that she had a breathing test yesterday which was good.    Limitations Walking;Standing    How long can you stand comfortably? 5 minutes  Diagnostic tests MRI    Patient Stated Goals Improve foot pain so she can walk comfortably    Currently in Pain? Yes    Pain Score 6     Pain Location Ankle    Pain Orientation Left    Pain Descriptors / Indicators Aching    Pain Type Chronic pain    Pain Onset More than a month ago    Pain Frequency Intermittent              TREATMENT     Ther-ex   NuStep L2 x 5 minutes for warm-up; Moist heat pack applied to L ankle x 5 minutes during interim history; Passive L ankle eversion stretch 45s hold x 2; Passive L ankle dorsiflexion stretch in longsitting with knee straight 45s x 2; Longsitting L ankle plantarflexion with mild resistance from therapist x 10; Longsitting L ankle  dorsiflexion with mild resistance from therapist x 10; Longsitting L ankle eversion with mild resistance from therapist x 10; Longistting L ankle eccentric inversion with resistance from therapist x 10, pt denies pain; L single leg Airex balance 30s x 3, very mild pain reported; Standing Prostretch calf stretch 45s x 3; 1/2 foam roll tandem balance with L foot in rear 30s x 3, very mild pain reported; Standing eccentric L heel lowering on step x 10;     Manual Therapy  L TC A/P mobilizations at available end range dorsiflexion, grade III, 30s/bout x 3 bouts; L TC distraction 30s/bout x 3 bouts; Navicular and cuboid superior to inferior mobilizations, grade II-III, 30s/bout x 2 bouts each; Transverse friction massage applied to tibialis posterior tendon posterior to medial malleolus in longsitting to improve tissue extensibility x 2 minutes, no visible signs of skin irritation afterward however pt does report discomfort during; Kinesiotape applied to L foot/ankle with 3 strips (one long strip from met heads around heel and up calf, two short strips from lateral aspect of foot to medial supporting the transverse arch);     Pt educated throughout session about proper posture and technique with exercises. Improved exercise technique, movement at target joints, use of target muscles after min to mod verbal, visual, tactile cues.    Patient making excellent progress towards her goals with decrease in pain and improved ability to walk.  She was able to perform all exercises as instructed today and reports mild pain with left single-leg stance on Airex, half foam roll balance, and eccentric left heel lowering on step.  Continued with manual techniques and included transverse friction massage to tibialis posterior tendon.  Also applied kinesiotape today to provide additional support to arches of foot.  Pt encouraged to continue HEP. Shewill benefit from PT services to address deficits in L foot pain in  order to return to full pain-free function at home.                          PT Short Term Goals - 03/11/21 1012      PT SHORT TERM GOAL #1   Title Pt will be independent with HEP in order to decrease foot pain and increase strength/ROM in order to improve pain-free function at home.    Time 4    Period Weeks    Status On-going    Target Date 04/08/21             PT Long Term Goals - 03/11/21 0918      PT LONG TERM GOAL #1  Title Pt will decrease worst pain as reported on NPRS by at least 3 points in order to demonstrate clinically significant reduction in foot pain.    Baseline 01/15/21: worst: 10/10, 03/11/21: 8/10    Time 8    Period Weeks    Status Partially Met    Target Date 05/06/21      PT LONG TERM GOAL #2   Title Pt will increase LEFS by at least 9 points in order to demonstrate significant improvement in lower extremity function.    Baseline 01/15/21: to be completed at next visit; 03/11/21: 21/80    Time 8    Period Weeks    Status On-going    Target Date 05/06/21      PT LONG TERM GOAL #3   Title Pt will improve painless L ankle active eversion to within 2 degrees of R side in order to demonstrate improved ankle mobility with less pain    Baseline 01/15/21: R/L 18/12 Ankle Eversion (L ankle painful); 03/11/21: Deferred to next session    Time 8    Period Weeks    Status Deferred    Target Date 05/06/21      PT LONG TERM GOAL #4   Title Pt will improve FOTO to at least 50 in order to demonstrate significant improvement in L ankle function    Baseline 01/15/21: 26; 03/11/21: 29    Time 8    Period Weeks    Status Partially Met    Target Date 05/06/21                 Plan - 03/14/21 0856    Clinical Impression Statement Patient making excellent progress towards her goals with decrease in pain and improved ability to walk.  She was able to perform all exercises as instructed today and reports mild pain with left single-leg stance  on Airex, half foam roll balance, and eccentric left heel lowering on step.  Continued with manual techniques and included transverse friction massage to tibialis posterior tendon.  Also applied kinesiotape today to provide additional support to arches of foot.  Pt encouraged to continue HEP. She will benefit from PT services to address deficits in L foot pain in order to return to full pain-free function at home.    Personal Factors and Comorbidities Time since onset of injury/illness/exacerbation;Past/Current Experience;Comorbidity 3+    Comorbidities HTN, hyperlipidemia, scarcoidosis, IBS    Examination-Activity Limitations Squat;Stand    Examination-Participation Restrictions Community Activity;Cleaning;Meal Prep    Stability/Clinical Decision Making Evolving/Moderate complexity    Rehab Potential Good    PT Frequency 2x / week    PT Duration 8 weeks    PT Treatment/Interventions Canalith Repostioning;Gait training;Stair training;Therapeutic activities;Balance training;Therapeutic exercise;Neuromuscular re-education;Patient/family education;Vestibular;Functional mobility training;ADLs/Self Care Home Management;Aquatic Therapy;Biofeedback;Electrical Stimulation;Cryotherapy;Iontophoresis 100m/ml Dexamethasone;Moist Heat;Ultrasound;Traction;Manual techniques;Passive range of motion;Dry needling;Taping;Joint Manipulations;Other (comment)   phonophoresis   PT Next Visit Plan Assess ROM and strength of digits, continue exercises/stretches, assess response to iontophoresis    PT Home Exercise Plan Access Code: [email or web address]   Consulted and Agree with Plan of Care Patient           Patient will benefit from skilled therapeutic intervention in order to improve the following deficits and impairments:  Pain,Abnormal gait,Difficulty walking  Visit Diagnosis: Pain in left foot     Problem List Patient Active Problem List   Diagnosis Date Noted  . Pure hypercholesterolemia 11/04/2016  . Essential  hypertension 09/18/2015  . Hyperlipidemia 09/18/2015  . Recurrent genital herpes  simplex 09/18/2015   Phillips Grout PT, DPT, GCS  Jerman Tinnon 03/14/2021, 12:48 PM  [place] [facility] [address]. [place], [place], [address] Phone: [phone]   Fax:  [phone]  Name: Verlie Liotta MRN: [id number] Date of Birth: 1956/05/07

## 2021-03-15 NOTE — Patient Instructions (Addendum)
TREATMENT   Ther-ex NuStep L2 x 5 minutes for warm-up; Moist heat pack applied to L ankle x45mnutes during interim history; Passive L ankle eversion stretch 45s hold x2; Passive L ankle dorsiflexion stretch in longsitting with knee straight 45sx 2; Longsitting L ankle plantarflexion with mild resistance from therapist x 10; Longsitting L ankle dorsiflexion with mild resistance from therapist x 10; Longsitting L ankle eversion with mild resistance from therapist x 10; Longistting L ankle eccentric inversion with resistance from therapistx 10, pt denies pain; L single leg Airex balance 30s x 3, very mild pain reported; Standing Prostretch calf stretch 45s x 3; 1/2 foam roll tandem balance with L foot in rear 30s x 3, very mild pain reported; Standing eccentric Lheelloweringon stepx 10;   Manual Therapy L TC A/P mobilizations at available end range dorsiflexion, grade III, 30s/bout x3bouts; L TC distraction 30s/bout x 3 bouts; Navicular and cuboid superior to inferior mobilizations, grade II-III, 30s/bout x 2 bouts each; Transverse friction massage applied to tibialis posterior tendon posterior to medial malleolus in longsitting to improve tissue extensibility x 2 minutes, no visible signs of skin irritationafterward however pt does report discomfort during; Kinesiotape applied to L foot/ankle with 3 strips (one long strip from met heads around heel and up calf, two short strips from lateral aspect of foot to medial supporting the transverse arch);   Pt educated throughout session about proper posture and technique with exercises. Improved exercise technique, movement at target joints, use of target muscles after min to mod verbal, visual, tactile cues.   Patient making excellent progress towards her goals with decrease in pain and improved ability to walk.  She was able to perform all exercises as instructed today and reports mild pain with left single-leg stance on  Airex, half foam roll balance, and eccentric left heel lowering on step.  Continued with manual techniques and included transverse friction massage to tibialis posterior tendon.  Also applied kinesiotape today to provide additional support to arches of foot.  Pt encouraged to continue HEP. Shewill benefit from PT services to address deficits in L foot pain in order to return to full pain-free function at home.

## 2021-03-18 ENCOUNTER — Other Ambulatory Visit: Payer: Self-pay

## 2021-03-18 ENCOUNTER — Ambulatory Visit: Payer: Medicaid Other

## 2021-03-18 DIAGNOSIS — M79672 Pain in left foot: Secondary | ICD-10-CM

## 2021-03-18 NOTE — Therapy (Signed)
[facility] [address] [place], [place], [address] Phone: [phone]   Fax:  [phone]  Physical Therapy Treatment  Patient Details  Name: Cheyenne Bean MRN: [id number] Date of Birth: 08/04/1956 Referring Provider (PT): Dr. Gardiner Fanti   Encounter Date: 03/18/2021   PT End of Session - 03/18/21 0955    Visit Number 12    Number of Visits 33    Date for PT Re-Evaluation 05/06/21    Authorization Type eval: 5/49/82, PN(and recert): 6/41/58; FOTO predicted visit count 14    PT Start Time 0845    PT Stop Time 0935    PT Time Calculation (min) 50 min    Activity Tolerance Patient tolerated treatment well;Patient limited by pain    Behavior During Therapy [facility] for tasks assessed/performed           Past Medical History:  Diagnosis Date  . BRCA negative 01/2017   MyRisk neg  . Family history of breast cancer    1/18 IBIS=10.7%  . Family history of ovarian cancer    MyRisk neg  . Genital herpes   . Hyperlipidemia   . Hypertension   . IBS (irritable bowel syndrome) 02/20/2017  . Sarcoidosis     Past Surgical History:  Procedure Laterality Date  . BACK SURGERY    . BUNIONECTOMY Left   . COLONOSCOPY  2012   cleared for 10 yrs  . COLONOSCOPY WITH PROPOFOL N/A 02/27/2017   Procedure: COLONOSCOPY WITH PROPOFOL;  Surgeon: Lollie Sails, MD;  Location: [facility] ENDOSCOPY;  Service: Endoscopy;  Laterality: N/A;  . VAGINAL HYSTERECTOMY      There were no vitals filed for this visit.   Subjective Assessment - 03/18/21 0847    Subjective Patient reports that she is doing well today. She reports 5/10 L ankle pain upon arrival today. Overall she reports improvement in her pain however still notices aggravation with extended walks. No specific questions upon arrival.    Pertinent History Pt reports that approximately 1 year ago she started having L foot pain. She was wearing a lot of flats around that time when the pain  started started. The pain improved after approximately 1 week without any intervention. She was visiting her dad in October 2021 and her foot pain flared up significantly again. She was unable ot put weight on her foot due to the pain and had to use crutches to help her walk. She went to the urgent care and was sent to general orthopedics who recommended orthotics and gave her two steroid shots on her medial foot near her L posterior tibial tendon. She reports improvement for 1-2 days and then the pain returned. After returning to [place] she saw general orthopedics who placed a medial heel wedge in her shoes. He also prescribed her Voltaren gel for topical anti-inflammatory use to be used up to 4 times/day. 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 The pain is located on the medial aspect of her L foot at the insertion on the medial navicular and she describes it as "achy." No other recent changes in medication or health. History of chronic back pain with LLE radicular pain to the calf. Pt reports she has had back surgery without improvement but does not recall what type of surgery she had. Pt has a history of sarcoidosis but states that she had a breathing test yesterday which was good.    Limitations Walking;Standing    How long  can you stand comfortably? 5 minutes    Diagnostic tests MRI    Patient Stated Goals Improve foot pain so she can walk comfortably    Currently in Pain? Yes    Pain Score 5     Pain Location Ankle    Pain Orientation Left    Pain Descriptors / Indicators Aching    Pain Type Chronic pain    Pain Onset More than a month ago    Pain Frequency Intermittent                   TREATMENT   Ther-ex NuStep L2 x 5 minutes for warm-up; Standing Prostretch calf stretch 45s x 3; 1/2 foam roll tandem balance with L foot in rear 45s x 3, no pain reported; BOSU forward lunges leading with LLE without UE support x 10 each; L single leg Airex balance  30s x 3, very mild pain reported; Total Gym L22 L single leg squats with dynadisc under LLE 2 x 10; Standing eccentric Lheelloweringon step 2x 10; Passive L ankle eversion stretch 45s hold x2;   Manual Therapy L TC A/P mobilizations at available end range dorsiflexion, grade III, 30s/bout x2bouts; Navicular and cuboid superior to inferior mobilizations, grade II-III, 30s/bout x 2 bouts each; Transverse friction massage applied to tibialis posterior tendon posterior to medial malleolus in longsitting to improve tissue extensibility x 2 minutes, no visible signs of skin irritationafterward however pt does report discomfort during; Kinesiotape applied to L foot/ankle with 3 strips (one long strip from met heads around heel and up calf, two short strips from lateral aspect of foot to medial supporting the transverse arch);   Pt educated throughout session about proper posture and technique with exercises. Improved exercise technique, movement at target joints, use of target muscles after min to mod verbal, visual, tactile cues.   Patient making excellent progress towards her goals with decrease in pain and improved ability to walk.  She reports approximately 20% improvement in her symptoms since starting therapy. No increase in pain during session with the exception of eccentric lowering on step. Continued with manual techniques and included transverse friction massage to tibialis posterior tendon again today which is uncomfortable for patient. Once again applied kinesiotape today to provide additional support to arches of foot.  Pt encouraged to continue HEP. Shewill benefit from PT services to address deficits in L foot pain in order to return to full pain-free function at home.                        PT Short Term Goals - 03/11/21 1012      PT SHORT TERM GOAL #1   Title Pt will be independent with HEP in order to decrease foot pain and increase strength/ROM  in order to improve pain-free function at home.    Time 4    Period Weeks    Status On-going    Target Date 04/08/21             PT Long Term Goals - 03/11/21 0918      PT LONG TERM GOAL #1   Title Pt will decrease worst pain as reported on NPRS by at least 3 points in order to demonstrate clinically significant reduction in foot pain.    Baseline 01/15/21: worst: 10/10, 03/11/21: 8/10    Time 8    Period Weeks    Status Partially Met    Target Date 05/06/21  PT LONG TERM GOAL #2   Title Pt will increase LEFS by at least 9 points in order to demonstrate significant improvement in lower extremity function.    Baseline 01/15/21: to be completed at next visit; 03/11/21: 21/80    Time 8    Period Weeks    Status On-going    Target Date 05/06/21      PT LONG TERM GOAL #3   Title Pt will improve painless L ankle active eversion to within 2 degrees of R side in order to demonstrate improved ankle mobility with less pain    Baseline 01/15/21: R/L 18/12 Ankle Eversion (L ankle painful); 03/11/21: Deferred to next session    Time 8    Period Weeks    Status Deferred    Target Date 05/06/21      PT LONG TERM GOAL #4   Title Pt will improve FOTO to at least 50 in order to demonstrate significant improvement in L ankle function    Baseline 01/15/21: 26; 03/11/21: 29    Time 8    Period Weeks    Status Partially Met    Target Date 05/06/21                 Plan - 03/18/21 0956    Clinical Impression Statement Patient making excellent progress towards her goals with decrease in pain and improved ability to walk.  She reports approximately 20% improvement in her symptoms since starting therapy. No increase in pain during session with the exception of eccentric lowering on step. Continued with manual techniques and included transverse friction massage to tibialis posterior tendon again today which is uncomfortable for patient. Once again applied kinesiotape today to provide  additional support to arches of foot.  Pt encouraged to continue HEP. She will benefit from PT services to address deficits in L foot pain in order to return to full pain-free function at home    Personal Factors and Comorbidities Time since onset of injury/illness/exacerbation;Past/Current Experience;Comorbidity 3+    Comorbidities HTN, hyperlipidemia, scarcoidosis, IBS    Examination-Activity Limitations Squat;Stand    Examination-Participation Restrictions Community Activity;Cleaning;Meal Prep    Stability/Clinical Decision Making Evolving/Moderate complexity    Rehab Potential Good    PT Frequency 2x / week    PT Duration 8 weeks    PT Treatment/Interventions Canalith Repostioning;Gait training;Stair training;Therapeutic activities;Balance training;Therapeutic exercise;Neuromuscular re-education;Patient/family education;Vestibular;Functional mobility training;ADLs/Self Care Home Management;Aquatic Therapy;Biofeedback;Electrical Stimulation;Cryotherapy;Iontophoresis 32m/ml Dexamethasone;Moist Heat;Ultrasound;Traction;Manual techniques;Passive range of motion;Dry needling;Taping;Joint Manipulations;Other (comment)   phonophoresis   PT Next Visit Plan Assess ROM and strength of digits, continue exercises/stretches, assess response to iontophoresis    PT Home Exercise Plan Access Code: [email or web address]   Consulted and Agree with Plan of Care Patient           Patient will benefit from skilled therapeutic intervention in order to improve the following deficits and impairments:  Pain,Abnormal gait,Difficulty walking  Visit Diagnosis: Pain in left foot     Problem List Patient Active Problem List   Diagnosis Date Noted  . Pure hypercholesterolemia 11/04/2016  . Essential hypertension 09/18/2015  . Hyperlipidemia 09/18/2015  . Recurrent genital herpes simplex 09/18/2015   JLyndel SafeHuprich PT, DPT, GCS  Kaydi Kley 03/18/2021, 10:02 AM  [place] [facility] [place] [address]Phone: [phone]  Fax:  [phone] Name: ACharleen MaderaMRN: [id number] of Birth: [birth year]

## 2021-03-19 NOTE — Patient Instructions (Incomplete)
TREATMENT   Ther-ex NuStep L2 x 5 minutes for warm-up; Standing Prostretch calf stretch 45s x 3; 1/2 foam roll tandem balancewith L foot in rear 45s x 3, no pain reported; BOSU forward lunges leading with LLE without UE support x 10 each; L single leg Airex balance 30s x 3, very mild pain reported; Total Gym L22 L single leg squats with dynadisc under LLE 2 x 10; Standing eccentric Lheelloweringon step 2x 10; Passive L ankle eversion stretch 45s hold x2;   Manual Therapy L TC A/P mobilizations at available end range dorsiflexion, grade III, 30s/bout x2bouts; Navicular and cuboid superior to inferior mobilizations, grade II-III, 30s/bout x 2 bouts each; Transverse friction massage applied to tibialis posterior tendon posterior to medial malleolusin longsitting to improve tissue extensibility x 2 minutes, no visible signs of skin irritationafterwardhowever pt does report discomfort during; Kinesiotape applied to L foot/ankle with 3 strips (one long strip from met heads around heel and up calf, two short strips from lateral aspect of foot to medial supporting the transverse arch);   Pt educated throughout session about proper posture and technique with exercises. Improved exercise technique, movement at target joints, use of target muscles after min to mod verbal, visual, tactile cues.   Patient making excellent progress towards her goals with decrease in pain and improved ability to walk.She reports approximately 20% improvement in her symptoms since starting therapy. No increase in pain during session with the exception of eccentric lowering on step.Continued with manual techniques and included transverse friction massage to tibialis posterior tendon again today which is uncomfortable for patient. Once again appliedkinesiotape today toprovide additional support to arches of foot. Pt encouraged to continue HEP. Shewill benefit from PT services to address deficits in  L foot pain in order to return to full pain-free function at home.

## 2021-03-20 ENCOUNTER — Ambulatory Visit: Payer: Medicaid Other

## 2021-03-20 ENCOUNTER — Other Ambulatory Visit: Payer: Self-pay

## 2021-03-20 DIAGNOSIS — M79672 Pain in left foot: Secondary | ICD-10-CM

## 2021-03-20 NOTE — Therapy (Signed)
[facility] [address] [place], [place], [address] Phone: [phone]   Fax:  [phone]  Physical Therapy Treatment  Patient Details  Name: Cheyenne Bean MRN: [id number] Date of Birth: 1956-09-24 Referring Provider (PT): Dr. Gardiner Fanti   Encounter Date: 03/20/2021   PT End of Session - 03/20/21 0906    Visit Number 13    Number of Visits 33    Date for PT Re-Evaluation 05/06/21    Authorization Type eval: 03/30/72, PN(and recert): 5/32/99; FOTO predicted visit count 14    PT Start Time 0845    PT Stop Time 0930    PT Time Calculation (min) 45 min    Activity Tolerance Patient tolerated treatment well;Patient limited by pain    Behavior During Therapy [facility] for tasks assessed/performed           Past Medical History:  Diagnosis Date  . BRCA negative 01/2017   MyRisk neg  . Family history of breast cancer    1/18 IBIS=10.7%  . Family history of ovarian cancer    MyRisk neg  . Genital herpes   . Hyperlipidemia   . Hypertension   . IBS (irritable bowel syndrome) 02/20/2017  . Sarcoidosis     Past Surgical History:  Procedure Laterality Date  . BACK SURGERY    . BUNIONECTOMY Left   . COLONOSCOPY  2012   cleared for 10 yrs  . COLONOSCOPY WITH PROPOFOL N/A 02/27/2017   Procedure: COLONOSCOPY WITH PROPOFOL;  Surgeon: Lollie Sails, MD;  Location: [facility] ENDOSCOPY;  Service: Endoscopy;  Laterality: N/A;  . VAGINAL HYSTERECTOMY      There were no vitals filed for this visit.   Subjective Assessment - 03/20/21 0850    Subjective Patient reports that she is doing well today. She reports 7/10 L foot/ankle pain upon arrival today. She did a lot of walking yesterday which was an improvement for her but she did have an increase in her pain by the end of the day. She also reports some L hip pain upon arrival today because she had her L hip in a figure 4 position while icing her ankle for an extended time. No specific  questions upon arrival.    Pertinent History Pt reports that approximately 1 year ago she started having L foot pain. She was wearing a lot of flats around that time when the pain started started. The pain improved after approximately 1 week without any intervention. She was visiting her dad in October 2021 and her foot pain flared up significantly again. She was unable ot put weight on her foot due to the pain and had to use crutches to help her walk. She went to the urgent care and was sent to general orthopedics who recommended orthotics and gave her two steroid shots on her medial foot near her L posterior tibial tendon. She reports improvement for 1-2 days and then the pain returned. After returning to [place] she saw general orthopedics who placed a medial heel wedge in her shoes. He also prescribed her Voltaren gel for topical anti-inflammatory use to be used up to 4 times/day. 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 The pain is located on the medial aspect of her L foot at the insertion on the medial navicular and she describes it as "achy." No other recent changes in medication or health. History of chronic back pain with LLE radicular pain to the calf. Pt reports she has  had back surgery without improvement but does not recall what type of surgery she had. Pt has a history of sarcoidosis but states that she had a breathing test yesterday which was good.    Limitations Walking;Standing    How long can you stand comfortably? 5 minutes    Diagnostic tests MRI    Patient Stated Goals Improve foot pain so she can walk comfortably    Currently in Pain? Yes    Pain Score 7     Pain Location Ankle    Pain Orientation Left    Pain Descriptors / Indicators Aching    Pain Type Chronic pain    Pain Onset More than a month ago    Pain Frequency Intermittent              TREATMENT   Ther-ex NuStep L2 x 5 minutes for warm-up; Standing eccentric Lheelloweringon step  2x 10; Passive L ankle eversion stretch 45s hold x2; Seated ABC x 1 bout; Seated circle CW/CCW x 20 each direction; Resisted L ankle dorsiflexion, inversion, and eversion 2 x 10 each; Seated towel scrunches x 1 minute; Seated talar doming x 1 minute;   Manual Therapy L TC A/P mobilizations at available end range dorsiflexion, grade III, 30s/bout x2bouts; Navicular and cuboid superior to inferior mobilizations, grade II-III, 30s/bout x 2 bouts each; Transverse friction massage applied to tibialis posterior tendon posterior to medial malleolus in longsitting to improve tissue extensibility x 2 minutes, no visible signs of skin irritationafterward however pt does report discomfort during; Kinesiotape applied to L foot/ankle with 3 strips (one long strip from met heads around heel and up calf, two short strips from lateral aspect of foot to medial supporting the transverse arch);   Pt educated throughout session about proper posture and technique with exercises. Improved exercise technique, movement at target joints, use of target muscles after min to mod verbal, visual, tactile cues.   Patient making excellent progress towards her goals with decrease in pain and improved ability to walk longer distances with less pain. She is able to complete exercises during session however session limited today by therapist due to reports of some acute increase in L hip pain since last night. Continued with manual techniques and included transverse friction massage to tibialis posterior tendon again today which is uncomfortable for patient. Once again applied kinesiotape today to provide additional support to arches of foot.  Will continue to progress strengthening for long term management of her pain. Pt encouraged to continue HEP. Shewill benefit from PT services to address deficits in L foot pain in order to return to full pain-free function at  home.                                  PT Short Term Goals - 03/11/21 1012      PT SHORT TERM GOAL #1   Title Pt will be independent with HEP in order to decrease foot pain and increase strength/ROM in order to improve pain-free function at home.    Time 4    Period Weeks    Status On-going    Target Date 04/08/21             PT Long Term Goals - 03/11/21 0918      PT LONG TERM GOAL #1   Title Pt will decrease worst pain as reported on NPRS by at least 3 points in order to  demonstrate clinically significant reduction in foot pain.    Baseline 01/15/21: worst: 10/10, 03/11/21: 8/10    Time 8    Period Weeks    Status Partially Met    Target Date 05/06/21      PT LONG TERM GOAL #2   Title Pt will increase LEFS by at least 9 points in order to demonstrate significant improvement in lower extremity function.    Baseline 01/15/21: to be completed at next visit; 03/11/21: 21/80    Time 8    Period Weeks    Status On-going    Target Date 05/06/21      PT LONG TERM GOAL #3   Title Pt will improve painless L ankle active eversion to within 2 degrees of R side in order to demonstrate improved ankle mobility with less pain    Baseline 01/15/21: R/L 18/12 Ankle Eversion (L ankle painful); 03/11/21: Deferred to next session    Time 8    Period Weeks    Status Deferred    Target Date 05/06/21      PT LONG TERM GOAL #4   Title Pt will improve FOTO to at least 50 in order to demonstrate significant improvement in L ankle function    Baseline 01/15/21: 26; 03/11/21: 29    Time 8    Period Weeks    Status Partially Met    Target Date 05/06/21                 Plan - 03/20/21 0906    Clinical Impression Statement Patient making excellent progress towards her goals with decrease in pain and improved ability to walk longer distances with less pain. She is able to complete exercises during session however session limited today by therapist due to reports  of some acute increase in L hip pain since last night. Continued with manual techniques and included transverse friction massage to tibialis posterior tendon again today which is uncomfortable for patient. Once again applied kinesiotape today to provide additional support to arches of foot.  Will continue to progress strengthening for long term management of her pain. Pt encouraged to continue HEP. She will benefit from PT services to address deficits in L foot pain in order to return to full pain-free function at home.    Personal Factors and Comorbidities Time since onset of injury/illness/exacerbation;Past/Current Experience;Comorbidity 3+    Comorbidities HTN, hyperlipidemia, scarcoidosis, IBS    Examination-Activity Limitations Squat;Stand    Examination-Participation Restrictions Community Activity;Cleaning;Meal Prep    Stability/Clinical Decision Making Evolving/Moderate complexity    Rehab Potential Good    PT Frequency 2x / week    PT Duration 8 weeks    PT Treatment/Interventions Canalith Repostioning;Gait training;Stair training;Therapeutic activities;Balance training;Therapeutic exercise;Neuromuscular re-education;Patient/family education;Vestibular;Functional mobility training;ADLs/Self Care Home Management;Aquatic Therapy;Biofeedback;Electrical Stimulation;Cryotherapy;Iontophoresis 27m/ml Dexamethasone;Moist Heat;Ultrasound;Traction;Manual techniques;Passive range of motion;Dry needling;Taping;Joint Manipulations;Other (comment)   phonophoresis   PT Next Visit Plan Assess ROM and strength of digits, continue exercises/stretches, assess response to iontophoresis    PT Home Exercise Plan Access Code: [email or web address]   Consulted and Agree with Plan of Care Patient           Patient will benefit from skilled therapeutic intervention in order to improve the following deficits and impairments:  Pain,Abnormal gait,Difficulty walking  Visit Diagnosis: Pain in left foot     Problem  List Patient Active Problem List   Diagnosis Date Noted  . Pure hypercholesterolemia 11/04/2016  . Essential hypertension 09/18/2015  . Hyperlipidemia 09/18/2015  . Recurrent genital  herpes simplex 09/18/2015   Phillips Grout PT, DPT, GCS  Rydan Gulyas 03/20/2021, 1:01 PM  [facility] [address] [place], [place], [address] Phone: [phone]   Fax:  [phone]  Name: Cheyenne Bean MRN: [id number] Date of Birth: 03/06/1956

## 2021-03-22 NOTE — Patient Instructions (Addendum)
TREATMENT   Ther-ex NuStep L2 x 5 minutes for warm-up; Standing eccentric Lheelloweringon step 2x 10; Passive L ankle eversion stretch 45s hold x2; Seated ABC x 1 bout; Seated circle CW/CCW x 20 each direction; Resisted L ankle dorsiflexion, inversion, and eversion 2 x 10 each; Seated towel scrunches x 1 minute; Seated talar doming x 1 minute;   Manual Therapy L TC A/P mobilizations at available end range dorsiflexion, grade III, 30s/bout x2bouts; Navicular and cuboid superior to inferior mobilizations, grade II-III, 30s/bout x 2 bouts each; Transverse friction massage applied to tibialis posterior tendon posterior to medial malleolusin longsitting to improve tissue extensibility x 2 minutes, no visible signs of skin irritationafterwardhowever pt does report discomfort during; Kinesiotape applied to L foot/ankle with 3 strips (one long strip from met heads around heel and up calf, two short strips from lateral aspect of foot to medial supporting the transverse arch);   Pt educated throughout session about proper posture and technique with exercises. Improved exercise technique, movement at target joints, use of target muscles after min to mod verbal, visual, tactile cues.   Patient making excellent progress towards her goals with decrease in pain and improved ability to walk longer distances with less pain. She is able to complete exercises during session however session limited today by therapist due to reports of some acute increase in L hip pain since last night.Continued with manual techniques and included transverse friction massage to tibialis posterior tendon again today which is uncomfortable for patient. Once again appliedkinesiotape today toprovide additional support to arches of foot. Will continue to progress strengthening for long term management of her pain. Pt encouraged to continue HEP. Shewill benefit from PT services to address deficits in L foot  pain in order to return to full pain-free function at home.

## 2021-03-25 ENCOUNTER — Ambulatory Visit: Payer: Medicaid Other | Attending: Orthopaedic Surgery

## 2021-03-25 ENCOUNTER — Other Ambulatory Visit: Payer: Self-pay

## 2021-03-25 DIAGNOSIS — M79672 Pain in left foot: Secondary | ICD-10-CM | POA: Diagnosis not present

## 2021-03-25 NOTE — Therapy (Signed)
[facility] [address]. [place], [place], [address] Phone: [phone]   Fax:  [phone]  Physical Therapy Treatment  Patient Details  Name: Cheyenne Bean MRN: [id number] Date of Birth: 1956/11/06 Referring Provider (PT): Dr. Gardiner Fanti   Encounter Date: 03/25/2021   PT End of Session - 03/25/21 0928    Visit Number 14    Number of Visits 33    Date for PT Re-Evaluation 05/06/21    Authorization Type eval: 06/03/23, PN(and recert): 4/97/53; FOTO predicted visit count 14    PT Start Time 0930    PT Stop Time 1015    PT Time Calculation (min) 45 min    Activity Tolerance Patient tolerated treatment well;Patient limited by pain    Behavior During Therapy [facility] for tasks assessed/performed           Past Medical History:  Diagnosis Date  . BRCA negative 01/2017   MyRisk neg  . Family history of breast cancer    1/18 IBIS=10.7%  . Family history of ovarian cancer    MyRisk neg  . Genital herpes   . Hyperlipidemia   . Hypertension   . IBS (irritable bowel syndrome) 02/20/2017  . Sarcoidosis     Past Surgical History:  Procedure Laterality Date  . BACK SURGERY    . BUNIONECTOMY Left   . COLONOSCOPY  2012   cleared for 10 yrs  . COLONOSCOPY WITH PROPOFOL N/A 02/27/2017   Procedure: COLONOSCOPY WITH PROPOFOL;  Surgeon: Lollie Sails, MD;  Location: [facility] ENDOSCOPY;  Service: Endoscopy;  Laterality: N/A;  . VAGINAL HYSTERECTOMY      There were no vitals filed for this visit.   Subjective Assessment - 03/25/21 0927    Subjective Patient reports that she is doing well today. She reports 5/10 L foot/ankle pain upon arrival today. However she states that her back is bothering her today and she rates her back pain as 8/10. She did go out on Saturday and states that she was able to wear heels and dance without any increase in her ankle pain.    Pertinent History Pt reports that approximately 1 year ago she started  having L foot pain. She was wearing a lot of flats around that time when the pain started started. The pain improved after approximately 1 week without any intervention. She was visiting her dad in October 2021 and her foot pain flared up significantly again. She was unable ot put weight on her foot due to the pain and had to use crutches to help her walk. She went to the urgent care and was sent to general orthopedics who recommended orthotics and gave her two steroid shots on her medial foot near her L posterior tibial tendon. She reports improvement for 1-2 days and then the pain returned. After returning to [place] she saw general orthopedics who placed a medial heel wedge in her shoes. He also prescribed her Voltaren gel for topical anti-inflammatory use to be used up to 4 times/day. 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 The pain is located on the medial aspect of her L foot at the insertion on the medial navicular and she describes it as "achy." No other recent changes in medication or health. History of chronic back pain with LLE radicular pain to the calf. Pt reports she has had back surgery without improvement but does not recall what type of surgery she had. Pt has a history of  sarcoidosis but states that she had a breathing test yesterday which was good.    Limitations Walking;Standing    How long can you stand comfortably? 5 minutes    Diagnostic tests MRI    Patient Stated Goals Improve foot pain so she can walk comfortably    Currently in Pain? Yes    Pain Score 4     Pain Location Ankle    Pain Orientation Left    Pain Descriptors / Indicators Aching    Pain Type Chronic pain    Pain Onset More than a month ago    Pain Frequency Intermittent              TREATMENT   Ther-ex NuStep L2 x 5 minutes for warm-up; Standing Prostretch calf stretch 45s x 3; 1/2 foam roll tandem balance with L foot in rear 45s x 3, no pain reported; L single leg Airex  balance 45s x 3, very mild pain reported; Toe walking in parallel bars 10 feet x 6; Standing eccentric Lheelloweringon step 2x 10; Seated circle CW/CCW x 20 each direction; Passive L ankle eversion stretch 45s hold x2; Resisted L ankle dorsiflexion, inversion, and eversion 2 x 10 each;   Manual Therapy L TC A/P mobilizations at available end range dorsiflexion, grade III, 30s/bout x2bouts; Navicular and cuboid superior to inferior mobilizations, grade II-III, 30s/bout x 2 bouts each; Transverse friction massage applied to tibialis posterior tendon posterior to medial malleolusin longsitting to improve tissue extensibility x 2 minutes, no visible signs of skin irritationafterwardhowever pt does report discomfort during;   Electrical Stimulation, unbilled  TENS applied to patient's lower lumbar spine bilaterally throughout session to help her with pain control. Utilized large muscle default TENS setting on Empi Continuum unit with small pads at pt tolerated intensity of 8.0 bilaterally initially increased to 8.5 after first 15 minutes. Total time 30 minutes during exercise;   Pt educated throughout session about proper posture and technique with exercises. Improved exercise technique, movement at target joints, use of target muscles after min to mod verbal, visual, tactile cues.   Patient making excellent progress towards her goals with decrease in pain and improved ability to walk longer distances with less pain.  She was able to go out dancing in heels this weekend without any increase in her pain.  She is able to complete exercises during session and denies any increase in her ankle pain with eccentric L ankle lowering.  Will consider progressing to concentric strengthening of her plantarflexors during next session.  We will also update HEP if patient is able to tolerate without any increase in her pain.  Continued with manual techniques and included transverse friction massage  to tibialis posterior tendon again today which is uncomfortable for patient.  Deferred Kinesiotape to see how patient tolerates ambulation without the additional support. Will continue to progress strengthening for long term management of her pain. Pt encouraged to continue HEP. Shewill benefit from PT services to address deficits in L foot pain in order to return to full pain-free function at home.                           PT Short Term Goals - 03/11/21 1012      PT SHORT TERM GOAL #1   Title Pt will be independent with HEP in order to decrease foot pain and increase strength/ROM in order to improve pain-free function at home.    Time 4  Period Weeks    Status On-going    Target Date 04/08/21             PT Long Term Goals - 03/11/21 0918      PT LONG TERM GOAL #1   Title Pt will decrease worst pain as reported on NPRS by at least 3 points in order to demonstrate clinically significant reduction in foot pain.    Baseline 01/15/21: worst: 10/10, 03/11/21: 8/10    Time 8    Period Weeks    Status Partially Met    Target Date 05/06/21      PT LONG TERM GOAL #2   Title Pt will increase LEFS by at least 9 points in order to demonstrate significant improvement in lower extremity function.    Baseline 01/15/21: to be completed at next visit; 03/11/21: 21/80    Time 8    Period Weeks    Status On-going    Target Date 05/06/21      PT LONG TERM GOAL #3   Title Pt will improve painless L ankle active eversion to within 2 degrees of R side in order to demonstrate improved ankle mobility with less pain    Baseline 01/15/21: R/L 18/12 Ankle Eversion (L ankle painful); 03/11/21: Deferred to next session    Time 8    Period Weeks    Status Deferred    Target Date 05/06/21      PT LONG TERM GOAL #4   Title Pt will improve FOTO to at least 50 in order to demonstrate significant improvement in L ankle function    Baseline 01/15/21: 26; 03/11/21: 29    Time 8     Period Weeks    Status Partially Met    Target Date 05/06/21                 Plan - 03/25/21 0929    Clinical Impression Statement Patient making excellent progress towards her goals with decrease in pain and improved ability to walk longer distances with less pain.  She was able to go out dancing in heels this weekend without any increase in her pain.  She is able to complete exercises during session and denies any increase in her ankle pain with eccentric L ankle lowering.  Will consider progressing to concentric strengthening of her plantarflexors during next session.  We will also update HEP if patient is able to tolerate without any increase in her pain.  Continued with manual techniques and included transverse friction massage to tibialis posterior tendon again today which is uncomfortable for patient.  Deferred Kinesiotape to see how patient tolerates ambulation without the additional support.  Will continue to progress strengthening for long term management of her pain. Pt encouraged to continue HEP. She will benefit from PT services to address deficits in L foot pain in order to return to full pain-free function at home.    Personal Factors and Comorbidities Time since onset of injury/illness/exacerbation;Past/Current Experience;Comorbidity 3+    Comorbidities HTN, hyperlipidemia, scarcoidosis, IBS    Examination-Activity Limitations Squat;Stand    Examination-Participation Restrictions Community Activity;Cleaning;Meal Prep    Stability/Clinical Decision Making Evolving/Moderate complexity    Rehab Potential Good    PT Frequency 2x / week    PT Duration 8 weeks    PT Treatment/Interventions Canalith Repostioning;Gait training;Stair training;Therapeutic activities;Balance training;Therapeutic exercise;Neuromuscular re-education;Patient/family education;Vestibular;Functional mobility training;ADLs/Self Care Home Management;Aquatic Therapy;Biofeedback;Electrical  Stimulation;Cryotherapy;Iontophoresis 87m/ml Dexamethasone;Moist Heat;Ultrasound;Traction;Manual techniques;Passive range of motion;Dry needling;Taping;Joint Manipulations;Other (comment)   phonophoresis   PT  Next Visit Plan Assess ROM and strength of digits, continue exercises/stretches, assess response to iontophoresis    PT Home Exercise Plan Access Code: [email or web address]    Consulted and Agree with Plan of Care Patient           Patient will benefit from skilled therapeutic intervention in order to improve the following deficits and impairments:  Pain,Abnormal gait,Difficulty walking  Visit Diagnosis: Pain in left foot     Problem List Patient Active Problem List   Diagnosis Date Noted  . Pure hypercholesterolemia 11/04/2016  . Essential hypertension 09/18/2015  . Hyperlipidemia 09/18/2015  . Recurrent genital herpes simplex 09/18/2015   Lyndel Safe Bronson Bressman PT, DPT, GCS  Cheyenne Bean 03/25/2021, 10:31 AM   [facility] [address] [place], [place], [address] Phone: [phone]   Fax:  [phone]  Name: Cheyenne Bean MRN: [id number] Date of Birth: Mar 30, 1956

## 2021-03-26 NOTE — Patient Instructions (Incomplete)
TREATMENT   Ther-ex NuStep L2 x 5 minutes for warm-up; Standing Prostretch calf stretch 45s x 3; 1/2 foam roll tandem balancewith L foot in rear 45s x 3, no pain reported; L single leg Airex balance 45s x 3, very mild pain reported; Toe walking in parallel bars 10 feet x 6; Standing eccentric Lheelloweringon step 2x 10; Seated circle CW/CCW x 20 each direction; Passive L ankle eversion stretch 45s hold x2; Resisted L ankle dorsiflexion, inversion, and eversion 2 x 10 each;   Manual Therapy L TC A/P mobilizations at available end range dorsiflexion, grade III, 30s/bout x2bouts; Navicular and cuboid superior to inferior mobilizations, grade II-III, 30s/bout x 2 bouts each; Transverse friction massage applied to tibialis posterior tendon posterior to medial malleolusin longsitting to improve tissue extensibility x 2 minutes, no visible signs of skin irritationafterwardhowever pt does report discomfort during;   Electrical Stimulation, unbilled  TENS applied to patient's lower lumbar spine bilaterally throughout session to help her with pain control. Utilized large muscle default TENS setting on Empi Continuum unit with small pads at pt tolerated intensity of 8.0 bilaterally initially increased to 8.5 after first 15 minutes. Total time 30 minutes during exercise;   Pt educated throughout session about proper posture and technique with exercises. Improved exercise technique, movement at target joints, use of target muscles after min to mod verbal, visual, tactile cues.   Patient making excellent progress towards her goals with decrease in pain and improved ability to walklonger distances with less pain.  She was able to go out dancing in heels this weekend without any increase in her pain.  She is able to complete exercises during session and denies any increase in her ankle pain with eccentric L ankle lowering.  Will consider progressing to concentric strengthening of  her plantarflexors during next session.  We will also update HEP if patient is able to tolerate without any increase in her pain.  Continued with manual techniques and included transverse friction massage to tibialis posterior tendon again today which is uncomfortable for patient.  Deferred Kinesiotape to see how patient tolerates ambulation without the additional support. Will continue to progress strengthening for long term management of her pain.Pt encouraged to continue HEP. Shewill benefit from PT services to address deficits in L foot pain in order to return to full pain-free function at home.

## 2021-03-27 ENCOUNTER — Ambulatory Visit: Payer: Medicaid Other

## 2021-03-27 ENCOUNTER — Other Ambulatory Visit: Payer: Self-pay

## 2021-03-27 DIAGNOSIS — M79672 Pain in left foot: Secondary | ICD-10-CM

## 2021-03-27 NOTE — Therapy (Signed)
[facility] [address] [place], [place], [address] Phone: [phone]   Fax:  [phone]  Physical Therapy Treatment  Patient Details  Name: Cheyenne Bean MRN: [id number] Date of Birth: 12-17-1956 Referring Provider (PT): Dr. Gardiner Fanti   Encounter Date: 03/27/2021   PT End of Session - 03/27/21 0906    Visit Number 15    Number of Visits 33    Date for PT Re-Evaluation 05/06/21    Authorization Type eval: 8/54/62, PN(and recert): 06/23/49; FOTO predicted visit count 14    PT Start Time 0845    PT Stop Time 0930    PT Time Calculation (min) 45 min    Activity Tolerance Patient tolerated treatment well;Patient limited by pain    Behavior During Therapy [facility] for tasks assessed/performed           Past Medical History:  Diagnosis Date  . BRCA negative 01/2017   MyRisk neg  . Family history of breast cancer    1/18 IBIS=10.7%  . Family history of ovarian cancer    MyRisk neg  . Genital herpes   . Hyperlipidemia   . Hypertension   . IBS (irritable bowel syndrome) 02/20/2017  . Sarcoidosis     Past Surgical History:  Procedure Laterality Date  . BACK SURGERY    . BUNIONECTOMY Left   . COLONOSCOPY  2012   cleared for 10 yrs  . COLONOSCOPY WITH PROPOFOL N/A 02/27/2017   Procedure: COLONOSCOPY WITH PROPOFOL;  Surgeon: Lollie Sails, MD;  Location: [facility] ENDOSCOPY;  Service: Endoscopy;  Laterality: N/A;  . VAGINAL HYSTERECTOMY      There were no vitals filed for this visit.   Subjective Assessment - 03/27/21 0903    Subjective Patient reports that she is doing well today. She reports 5/10 L foot/ankle pain upon arrival today. However she states that her back is still bothering her today and she rates her back pain as 8/10.    Pertinent History Pt reports that approximately 1 year ago she started having L foot pain. She was wearing a lot of flats around that time when the pain started started. The pain improved  after approximately 1 week without any intervention. She was visiting her dad in October 2021 and her foot pain flared up significantly again. She was unable ot put weight on her foot due to the pain and had to use crutches to help her walk. She went to the urgent care and was sent to general orthopedics who recommended orthotics and gave her two steroid shots on her medial foot near her L posterior tibial tendon. She reports improvement for 1-2 days and then the pain returned. After returning to [place] she saw general orthopedics who placed a medial heel wedge in her shoes. He also prescribed her Voltaren gel for topical anti-inflammatory use to be used up to 4 times/day. 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 The pain is located on the medial aspect of her L foot at the insertion on the medial navicular and she describes it as "achy." No other recent changes in medication or health. History of chronic back pain with LLE radicular pain to the calf. Pt reports she has had back surgery without improvement but does not recall what type of surgery she had. Pt has a history of sarcoidosis but states that she had a breathing test yesterday which was good.    Limitations Walking;Standing    How long  can you stand comfortably? 5 minutes    Diagnostic tests MRI    Patient Stated Goals Improve foot pain so she can walk comfortably    Currently in Pain? Yes    Pain Score 5     Pain Location Ankle    Pain Orientation Left    Pain Descriptors / Indicators Aching    Pain Type Chronic pain    Pain Onset More than a month ago    Pain Frequency Intermittent              TREATMENT   Ther-ex NuStep L2 x 5 minutes for warm-up with moist heat on low back during history (unbilled); Standing Prostretch calf stretch 45s x 3; L single leg BOSU (round side up) balance 45s x 3, denies pain; Standing eccentric Lheelloweringon stepx 10; Standing bilateral heel raises on step 2 x 10, no  pain reported by therapist; Resisted L ankle dorsiflexion, inversion, and eversion 2 x 10 each;   Manual Therapy L TC A/P mobilizations at available end range dorsiflexion, grade III, 30s/bout x2bouts; Navicular and cuboid superior to inferior mobilizations, grade II-III, 30s/bout x 2 bouts each; Transverse friction massage applied to tibialis posterior tendon posterior to medial malleolusin longsitting to improve tissue extensibility x 2 minutes, no visible signs of skin irritationafterwardhowever pt does report discomfort during;   Electrical Stimulation, unbilled  TENS applied to patient's lower lumbar spine bilaterally throughout session to help her with pain control. Utilized large muscle default TENS setting on Empi Continuum unit with small pads at pt tolerated intensity of 8.0 bilaterally initially increased to 8.5 after first 15 minutes. Total time 30 minutes during exercise;   Pt educated throughout session about proper posture and technique with exercises. Improved exercise technique, movement at target joints, use of target muscles after min to mod verbal, visual, tactile cues.   Patient making excellent progress towards her goals with decrease in pain and improved ability to walklonger distances with less pain. Able to progress today to concentric calf strengthening without any increase I nher pain. Continued with manual techniques and included transverse friction massage to tibialis posterior tendon again today which remains uncomfortable for patient.  Deferred Kinesiotape again today as pt reports that after last session she did not notice any significant increase in her pain while walking. Will continue to progress strengthening for long term management of her pain.Will update HEP at next visit as appropriate. Pt encouraged to continue HEP. Shewill benefit from PT services to address deficits in L foot pain in order to return to full pain-free function at  home.                       PT Short Term Goals - 03/11/21 1012      PT SHORT TERM GOAL #1   Title Pt will be independent with HEP in order to decrease foot pain and increase strength/ROM in order to improve pain-free function at home.    Time 4    Period Weeks    Status On-going    Target Date 04/08/21             PT Long Term Goals - 03/11/21 0918      PT LONG TERM GOAL #1   Title Pt will decrease worst pain as reported on NPRS by at least 3 points in order to demonstrate clinically significant reduction in foot pain.    Baseline 01/15/21: worst: 10/10, 03/11/21: 8/10    Time 8  Period Weeks    Status Partially Met    Target Date 05/06/21      PT LONG TERM GOAL #2   Title Pt will increase LEFS by at least 9 points in order to demonstrate significant improvement in lower extremity function.    Baseline 01/15/21: to be completed at next visit; 03/11/21: 21/80    Time 8    Period Weeks    Status On-going    Target Date 05/06/21      PT LONG TERM GOAL #3   Title Pt will improve painless L ankle active eversion to within 2 degrees of R side in order to demonstrate improved ankle mobility with less pain    Baseline 01/15/21: R/L 18/12 Ankle Eversion (L ankle painful); 03/11/21: Deferred to next session    Time 8    Period Weeks    Status Deferred    Target Date 05/06/21      PT LONG TERM GOAL #4   Title Pt will improve FOTO to at least 50 in order to demonstrate significant improvement in L ankle function    Baseline 01/15/21: 26; 03/11/21: 29    Time 8    Period Weeks    Status Partially Met    Target Date 05/06/21                 Plan - 03/27/21 0906    Clinical Impression Statement Patient making excellent progress towards her goals with decrease in pain and improved ability to walk longer distances with less pain. Able to progress today to concentric calf strengthening without any increase I nher pain. Continued with manual techniques and  included transverse friction massage to tibialis posterior tendon again today which remains uncomfortable for patient.  Deferred Kinesiotape again today as pt reports that after last session she did not notice any significant increase in her pain while walking. Will continue to progress strengthening for long term management of her pain. Will update HEP at next visit as appropriate. Pt encouraged to continue HEP. She will benefit from PT services to address deficits in L foot pain in order to return to full pain-free function at home.    Personal Factors and Comorbidities Time since onset of injury/illness/exacerbation;Past/Current Experience;Comorbidity 3+    Comorbidities HTN, hyperlipidemia, scarcoidosis, IBS    Examination-Activity Limitations Squat;Stand    Examination-Participation Restrictions Community Activity;Cleaning;Meal Prep    Stability/Clinical Decision Making Evolving/Moderate complexity    Rehab Potential Good    PT Frequency 2x / week    PT Duration 8 weeks    PT Treatment/Interventions Canalith Repostioning;Gait training;Stair training;Therapeutic activities;Balance training;Therapeutic exercise;Neuromuscular re-education;Patient/family education;Vestibular;Functional mobility training;ADLs/Self Care Home Management;Aquatic Therapy;Biofeedback;Electrical Stimulation;Cryotherapy;Iontophoresis 48m/ml Dexamethasone;Moist Heat;Ultrasound;Traction;Manual techniques;Passive range of motion;Dry needling;Taping;Joint Manipulations;Other (comment)   phonophoresis   PT Next Visit Plan Assess ROM and strength of digits, continue exercises/stretches, assess response to iontophoresis    PT Home Exercise Plan Access Code: [email or web address]   Consulted and Agree with Plan of Care Patient           Patient will benefit from skilled therapeutic intervention in order to improve the following deficits and impairments:  Pain,Abnormal gait,Difficulty walking  Visit Diagnosis: Pain in left  foot     Problem List Patient Active Problem List   Diagnosis Date Noted  . Pure hypercholesterolemia 11/04/2016  . Essential hypertension 09/18/2015  . Hyperlipidemia 09/18/2015  . Recurrent genital herpes simplex 09/18/2015   JLyndel SafeHuprich PT, DPT, GCS  Cheyenne Bean 03/27/2021, 9:55 AM  [place]  [facility] [address] [place], [place], [address] Phone: [phone]   Fax:  [phone]  Name: Cheyenne Bean MRN: [id number] Date of Birth: 1956-02-02

## 2021-03-28 NOTE — Patient Instructions (Incomplete)
TREATMENT   Ther-ex NuStep L2 x 5 minutes for warm-up with moist heat on low back during history (unbilled); Standing Prostretch calf stretch 45s x 3; L single leg BOSU (round side up) balance45s x 3, denies pain; Standing eccentric Lheelloweringon stepx 10; Standing bilateral heel raises on step 2 x 10, no pain reported by therapist; Resisted L ankle dorsiflexion, inversion, and eversion 2 x 10 each;   Manual Therapy L TC A/P mobilizations at available end range dorsiflexion, grade III, 30s/bout x2bouts; Navicular and cuboid superior to inferior mobilizations, grade II-III, 30s/bout x 2 bouts each; Transverse friction massage applied to tibialis posterior tendon posterior to medial malleolusin longsitting to improve tissue extensibility x 2 minutes, no visible signs of skin irritationafterwardhowever pt does report discomfort during;   Electrical Stimulation, unbilled TENS applied to patient's lower lumbar spine bilaterally throughout session to help her with pain control. Utilizedlarge muscle default TENSsetting on Empi Continuum unitwith small pads at pt tolerated intensity of 8.0 bilaterally initially increased to 8.5 after first 15 minutes. Total time 30 minutes during exercise;   Pt educated throughout session about proper posture and technique with exercises. Improved exercise technique, movement at target joints, use of target muscles after min to mod verbal, visual, tactile cues.   Patient making excellent progress towards her goals with decrease in pain and improved ability to walklonger distances with less pain.Able to progress today to concentric calf strengthening without any increase I nher pain.Continued with manual techniques and included transverse friction massage to tibialis posterior tendon again today which remains uncomfortable for patient.Deferred Kinesiotape again today as pt reports that after last session she did not notice any  significant increase in her pain while walking. Will continue to progress strengthening for long term management of her pain.Will update HEP at next visit as appropriate. Pt encouraged to continue HEP. Shewill benefit from PT services to address deficits in L foot pain in order to return to full pain-free function at home.

## 2021-04-01 ENCOUNTER — Ambulatory Visit: Payer: Medicaid Other

## 2021-04-01 DIAGNOSIS — M79672 Pain in left foot: Secondary | ICD-10-CM

## 2021-04-02 NOTE — Patient Instructions (Incomplete)
TREATMENT   Ther-ex NuStep L2 x 5 minutes for warm-up with moist heat on low back during history (unbilled); Standing Prostretch calf stretch 45s x 3; L single leg BOSU (round side up) balance45s x 3, denies pain; Standing eccentric Lheelloweringon stepx 10; Standing bilateral heel raises on step 2 x 10, no pain reported by therapist; Resisted L ankle dorsiflexion, inversion, and eversion 2 x 10 each;   Manual Therapy L TC A/P mobilizations at available end range dorsiflexion, grade III, 30s/bout x2bouts; Navicular and cuboid superior to inferior mobilizations, grade II-III, 30s/bout x 2 bouts each; Transverse friction massage applied to tibialis posterior tendon posterior to medial malleolusin longsitting to improve tissue extensibility x 2 minutes, no visible signs of skin irritationafterwardhowever pt does report discomfort during;   Electrical Stimulation, unbilled TENS applied to patient's lower lumbar spine bilaterally throughout session to help her with pain control. Utilizedlarge muscle default TENSsetting on Empi Continuum unitwith small pads at pt tolerated intensity of 8.0 bilaterally initially increased to 8.5 after first 15 minutes. Total time 30 minutes during exercise;   Pt educated throughout session about proper posture and technique with exercises. Improved exercise technique, movement at target joints, use of target muscles after min to mod verbal, visual, tactile cues.   Patient making excellent progress towards her goals with decrease in pain and improved ability to walklonger distances with less pain.Able to progress today to concentric calf strengthening without any increase I nher pain.Continued with manual techniques and included transverse friction massage to tibialis posterior tendon again today which remains uncomfortable for patient.Deferred Kinesiotape again today as pt reports that after last session she did not notice any  significant increase in her pain while walking. Will continue to progress strengthening for long term management of her pain.Will update HEP at next visit as appropriate. Pt encouraged to continue HEP. Shewill benefit from PT services to address deficits in L foot pain in order to return to full pain-free function at home.  

## 2021-04-03 ENCOUNTER — Ambulatory Visit: Payer: Medicaid Other

## 2021-04-03 ENCOUNTER — Other Ambulatory Visit: Payer: Self-pay

## 2021-04-03 DIAGNOSIS — M79672 Pain in left foot: Secondary | ICD-10-CM | POA: Diagnosis not present

## 2021-04-03 NOTE — Therapy (Signed)
[facility] [address] [place], [place], [address] Phone: [phone]   Fax:  [phone]  Physical Therapy Treatment  Patient Details  Name: Cheyenne Bean MRN: [id number] Date of Birth: [birth year] Referring Provider (PT): Dr. Gardiner Fanti   Encounter Date: 04/03/2021   PT End of Session - 04/03/21 1033    Visit Number 16    Number of Visits 33    Date for PT Re-Evaluation 05/06/21    Authorization Type eval: 5/00/93, PN(and recert): 08/08/28; FOTO predicted visit count 14    PT Start Time 0930    PT Stop Time 1015    PT Time Calculation (min) 45 min    Activity Tolerance Patient tolerated treatment well;Patient limited by pain    Behavior During Therapy [facility] for tasks assessed/performed           Past Medical History:  Diagnosis Date  . BRCA negative 01/2017   MyRisk neg  . Family history of breast cancer    1/18 IBIS=10.7%  . Family history of ovarian cancer    MyRisk neg  . Genital herpes   . Hyperlipidemia   . Hypertension   . IBS (irritable bowel syndrome) 02/20/2017  . Sarcoidosis     Past Surgical History:  Procedure Laterality Date  . BACK SURGERY    . BUNIONECTOMY Left   . COLONOSCOPY  2012   cleared for 10 yrs  . COLONOSCOPY WITH PROPOFOL N/A 02/27/2017   Procedure: COLONOSCOPY WITH PROPOFOL;  Surgeon: Lollie Sails, MD;  Location: [facility] ENDOSCOPY;  Service: Endoscopy;  Laterality: N/A;  . VAGINAL HYSTERECTOMY      There were no vitals filed for this visit.   Subjective Assessment - 04/03/21 0944    Subjective Patient reports that she is doing well today. She reports minimal L foot/ankle pain today but mostly stiffness. However she still rates the L ankle/foot pain as 6/10 today. Her back is still bothering her today and she rates her back pain as 10/10.    Pertinent History Pt reports that approximately 1 year ago she started having L foot pain. She was wearing a lot of flats around that  time when the pain started started. The pain improved after approximately 1 week without any intervention. She was visiting her dad in October 2021 and her foot pain flared up significantly again. She was unable ot put weight on her foot due to the pain and had to use crutches to help her walk. She went to the urgent care and was sent to general orthopedics who recommended orthotics and gave her two steroid shots on her medial foot near her L posterior tibial tendon. She reports improvement for 1-2 days and then the pain returned. After returning to [place] she saw general orthopedics who placed a medial heel wedge in her shoes. He also prescribed her Voltaren gel for topical anti-inflammatory use to be used up to 4 times/day. 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 The pain is located on the medial aspect of her L foot at the insertion on the medial navicular and she describes it as "achy." No other recent changes in medication or health. History of chronic back pain with LLE radicular pain to the calf. Pt reports she has had back surgery without improvement but does not recall what type of surgery she had. Pt has a history of sarcoidosis but states that she had a breathing test yesterday which was good.  Limitations Walking;Standing    How long can you stand comfortably? 5 minutes    Diagnostic tests MRI    Patient Stated Goals Improve foot pain so she can walk comfortably    Currently in Pain? Yes    Pain Score 6     Pain Location Ankle    Pain Orientation Left    Pain Descriptors / Indicators Aching    Pain Type Chronic pain    Pain Onset More than a month ago    Pain Frequency Intermittent               TREATMENT   Ther-ex NuStep L2 x 5 minutes for warm-up with moist heat on low back during history (unbilled); Standing Prostretch calf stretch 45s x 3; Standing bilateral heel raises on step 2 x 10, no pain reported by therapist; Attempted L single leg heel  raise and pt completed 10 but due to increase in pain regressed to second set of bilateral heel raises; L single leg BOSU (round side up) balance45s x 3, denies pain; 1/2 foam roll (flat side up) tandem balance (L foot back) 45s x 3; Airex balance beam tandem gait in // bars without UE support 6' x 4 lengths; Green tband resisted L ankle dorsiflexion, inversion, and eversion 2 x 10 each;   Manual Therapy Transverse friction massage applied to tibialis posterior tendon posterior to medial malleolusin longsitting to improve tissue extensibility x 5 minutes, no visible signs of skin irritationafterwardhowever pt does report discomfort during but much ess than previous sessions;   Electrical Stimulation, unbilled TENS applied to patient's lower lumbar spine bilaterally throughout session to help her with pain control. Utilizedlarge muscle default TENSsetting on Empi Continuum unitwith small pads at pt tolerated intensity of 9.0 bilaterally. Total time 30 minutes during exercise. All time unbilled;   Pt educated throughout session about proper posture and technique with exercises. Improved exercise technique, movement at target joints, use of target muscles after min to mod verbal, visual, tactile cues.   Patient making excellent progress towards her goals with decrease in pain and improved ability to walklonger distances with less pain.Able to continue today to concentric calf strengthening without any increase in pain. Attempted L single leg heel raises however discontinued due to increase in L foot/ankle pain. Introduced Airex balance beam tandem gait for medial/lateral ankle stability/strnegthening.Continued with transverse friction massage to tibialis posterior tendon again today which is only minimally uncomfortable for patient today which is improvement from previous sessions.Deferred Kinesiotape again today. Will continue to progress strengthening for long term management  of her pain. Pt encouraged to continue HEP. Shewill benefit from PT services to address deficits in L foot pain in order to return to full pain-free function at home.                         PT Short Term Goals - 03/11/21 1012      PT SHORT TERM GOAL #1   Title Pt will be independent with HEP in order to decrease foot pain and increase strength/ROM in order to improve pain-free function at home.    Time 4    Period Weeks    Status On-going    Target Date 04/08/21             PT Long Term Goals - 03/11/21 0918      PT LONG TERM GOAL #1   Title Pt will decrease worst pain as reported on NPRS by  at least 3 points in order to demonstrate clinically significant reduction in foot pain.    Baseline 01/15/21: worst: 10/10, 03/11/21: 8/10    Time 8    Period Weeks    Status Partially Met    Target Date 05/06/21      PT LONG TERM GOAL #2   Title Pt will increase LEFS by at least 9 points in order to demonstrate significant improvement in lower extremity function.    Baseline 01/15/21: to be completed at next visit; 03/11/21: 21/80    Time 8    Period Weeks    Status On-going    Target Date 05/06/21      PT LONG TERM GOAL #3   Title Pt will improve painless L ankle active eversion to within 2 degrees of R side in order to demonstrate improved ankle mobility with less pain    Baseline 01/15/21: R/L 18/12 Ankle Eversion (L ankle painful); 03/11/21: Deferred to next session    Time 8    Period Weeks    Status Deferred    Target Date 05/06/21      PT LONG TERM GOAL #4   Title Pt will improve FOTO to at least 50 in order to demonstrate significant improvement in L ankle function    Baseline 01/15/21: 26; 03/11/21: 29    Time 8    Period Weeks    Status Partially Met    Target Date 05/06/21                 Plan - 04/03/21 1034    Clinical Impression Statement Patient making excellent progress towards her goals with decrease in pain and improved ability to  walk longer distances with less pain. Able to continue today to concentric calf strengthening without any increase in pain. Attempted L single leg heel raises however discontinued due to increase in L foot/ankle pain. Introduced Airex balance beam tandem gait for medial/lateral ankle stability/strnegthening. Continued with transverse friction massage to tibialis posterior tendon again today which is only minimally uncomfortable for patient today which is improvement from previous sessions.  Deferred Kinesiotape again today. Will continue to progress strengthening for long term management of her pain. Pt encouraged to continue HEP. She will benefit from PT services to address deficits in L foot pain in order to return to full pain-free function at home.    Personal Factors and Comorbidities Time since onset of injury/illness/exacerbation;Past/Current Experience;Comorbidity 3+    Comorbidities HTN, hyperlipidemia, scarcoidosis, IBS    Examination-Activity Limitations Squat;Stand    Examination-Participation Restrictions Community Activity;Cleaning;Meal Prep    Stability/Clinical Decision Making Evolving/Moderate complexity    Rehab Potential Good    PT Frequency 2x / week    PT Duration 8 weeks    PT Treatment/Interventions Canalith Repostioning;Gait training;Stair training;Therapeutic activities;Balance training;Therapeutic exercise;Neuromuscular re-education;Patient/family education;Vestibular;Functional mobility training;ADLs/Self Care Home Management;Aquatic Therapy;Biofeedback;Electrical Stimulation;Cryotherapy;Iontophoresis 67m/ml Dexamethasone;Moist Heat;Ultrasound;Traction;Manual techniques;Passive range of motion;Dry needling;Taping;Joint Manipulations;Other (comment)   phonophoresis   PT Next Visit Plan Assess ROM and strength of digits, continue exercises/stretches, assess response to iontophoresis    PT Home Exercise Plan Access Code: [email or web address]   Consulted and Agree with Plan of Care Patient            Patient will benefit from skilled therapeutic intervention in order to improve the following deficits and impairments:  Pain,Abnormal gait,Difficulty walking  Visit Diagnosis: Pain in left foot     Problem List Patient Active Problem List   Diagnosis Date Noted  . Pure hypercholesterolemia 11/04/2016  .  Essential hypertension 09/18/2015  . Hyperlipidemia 09/18/2015  . Recurrent genital herpes simplex 09/18/2015   Cheyenne Bean PT, DPT, GCS  Cheyenne Bean 04/03/2021, 10:43 AM  [facility] [address] [place], [place], [address] Phone: [phone]   Fax:  [phone]  Name: Cheyenne Bean MRN: [id number] Date of Birth: 17-Dec-1956

## 2021-04-04 ENCOUNTER — Encounter: Payer: Self-pay | Admitting: Family Medicine

## 2021-04-04 ENCOUNTER — Ambulatory Visit
Admission: RE | Admit: 2021-04-04 | Discharge: 2021-04-04 | Disposition: A | Discharge status: Home / Self Care | Payer: Medicaid Other | Attending: Family Medicine | Admitting: Family Medicine

## 2021-04-04 ENCOUNTER — Ambulatory Visit (INDEPENDENT_AMBULATORY_CARE_PROVIDER_SITE_OTHER): Payer: Medicaid Other | Admitting: Family Medicine

## 2021-04-04 ENCOUNTER — Ambulatory Visit
Admission: RE | Admit: 2021-04-04 | Discharge: 2021-04-04 | Disposition: A | Discharge status: Home / Self Care | Payer: Medicaid Other | Source: Ambulatory Visit | Attending: Family Medicine | Admitting: Family Medicine

## 2021-04-04 VITALS — BP 100/70 | HR 60 | Ht 64.0 in | Wt 153.0 lb

## 2021-04-04 DIAGNOSIS — M79632 Pain in left forearm: Secondary | ICD-10-CM | POA: Diagnosis not present

## 2021-04-04 NOTE — Progress Notes (Signed)
Date:  04/04/2021   Name:  Cheyenne Bean   DOB:  11/27/1956   MRN:  [id number]   Chief Complaint: Arm Injury (Knot in L) forearm- unsure if it could be a piece of glass that has "came to the surface from an injury years ago")  Arm Injury  Incident onset: 30 years ago. Incident location: [place]. Injury mechanism: glass hit arm. The pain is present in the left forearm. Quality: "tender in spot" The pain is mild. Pertinent negatives include no muscle weakness or numbness. The treatment provided mild relief.    Lab Results  Component Value Date   CREATININE 0.80 12/13/2020   BUN 18 12/13/2020   NA 139 12/13/2020   K 3.7 12/13/2020   CL 104 12/13/2020   CO2 28 12/13/2020   Lab Results  Component Value Date   CHOL 170 04/03/2020   HDL 45 04/03/2020   LDLCALC 112 (H) 04/03/2020   TRIG 65 04/03/2020   CHOLHDL 3.2 03/26/2018   No results found for: TSH No results found for: HGBA1C Lab Results  Component Value Date   WBC 5.5 12/13/2020   HGB 14.4 12/13/2020   HCT 43.3 12/13/2020   MCV 87.1 12/13/2020   PLT 199 12/13/2020   Lab Results  Component Value Date   ALT 24 08/05/2019   AST 21 08/05/2019   ALKPHOS 101 08/05/2019   BILITOT 0.3 08/05/2019     Review of Systems  Constitutional: Negative.  Negative for chills, fatigue, fever and unexpected weight change.  HENT: Negative for congestion, ear discharge, ear pain, rhinorrhea, sinus pressure, sneezing and sore throat.   Eyes: Negative for photophobia, pain, discharge, redness and itching.  Respiratory: Negative for cough, shortness of breath, wheezing and stridor.   Gastrointestinal: Negative for abdominal pain, blood in stool, constipation, diarrhea, nausea and vomiting.  Endocrine: Negative for cold intolerance, heat intolerance, polydipsia, polyphagia and polyuria.  Genitourinary: Negative for dysuria, flank pain, frequency, hematuria, menstrual problem, pelvic pain, urgency, vaginal bleeding and vaginal  discharge.  Musculoskeletal: Negative for arthralgias, back pain and myalgias.  Skin: Negative for rash.  Allergic/Immunologic: Negative for environmental allergies and food allergies.  Neurological: Negative for dizziness, weakness, light-headedness, numbness and headaches.  Hematological: Negative for adenopathy. Does not bruise/bleed easily.  Psychiatric/Behavioral: Negative for dysphoric mood. The patient is not nervous/anxious.     Patient Active Problem List   Diagnosis Date Noted  . Pure hypercholesterolemia 11/04/2016  . Essential hypertension 09/18/2015  . Hyperlipidemia 09/18/2015  . Recurrent genital herpes simplex 09/18/2015    No Known Allergies  Past Surgical History:  Procedure Laterality Date  . BACK SURGERY    . BUNIONECTOMY Left   . COLONOSCOPY  2012   cleared for 10 yrs  . COLONOSCOPY WITH PROPOFOL N/A 02/27/2017   Procedure: COLONOSCOPY WITH PROPOFOL;  Surgeon: Christena Deem, MD;  Location: [facility] ENDOSCOPY;  Service: Endoscopy;  Laterality: N/A;  . VAGINAL HYSTERECTOMY      Social History   Tobacco Use  . Smoking status: Never Smoker  . Smokeless tobacco: Never Used  Vaping Use  . Vaping Use: Never used  Substance Use Topics  . Alcohol use: No    Alcohol/week: 0.0 standard drinks  . Drug use: No     Medication list has been reviewed and updated.  Current Meds  Medication Sig  . amLODipine (NORVASC) 5 MG tablet Take 1 tablet by mouth daily. Gwen Pounds  . diclofenac sodium (VOLTAREN) 1 % GEL Apply 2 g topically 4 (  four) times daily.  . famotidine (PEPCID) 20 MG tablet Take 1 tablet (20 mg total) by mouth 2 (two) times daily.  Marland Kitchen FLUoxetine (PROZAC) 40 MG capsule Take 1 capsule by mouth daily. Behavioral health  . pravastatin (PRAVACHOL) 20 MG tablet Take 1 tablet by mouth once daily  . valACYclovir (VALTREX) 500 MG tablet TAKE 1 TABLET BY MOUTH ONCE DAILY. APPOINTMENT NEEDED    PHQ 2/9 Scores 03/06/2021 11/28/2020 04/03/2020 08/05/2019  PHQ - 2  Score 0 0 2 0  PHQ- 9 Score 0 0 2 0    GAD 7 : Generalized Anxiety Score 11/28/2020 04/03/2020  Nervous, Anxious, on Edge 0 0  Control/stop worrying 0 0  Worry too much - different things 0 0  Trouble relaxing 0 0  Restless 0 0  Easily annoyed or irritable 0 0  Afraid - awful might happen 0 0  Total GAD 7 Score 0 0    BP Readings from Last 3 Encounters:  04/04/21 100/70  03/06/21 120/80  12/13/20 (!) 150/84    Physical Exam  Wt Readings from Last 3 Encounters:  04/04/21 153 lb (69.4 kg)  03/06/21 155 lb (70.3 kg)  12/13/20 160 lb (72.6 kg)    BP 100/70   Pulse 60   Ht 5\' 4"  (1.626 m)   Wt 153 lb (69.4 kg)   BMI 26.26 kg/m   Assessment and Plan:  1. Left forearm pain During the course of left arm pain that has been going on for years patient noticed an area on the ulnar aspect of the mid distal left forearm discomfort in palpable area that is tender that is consistent with a possible trauma when she stopped some glass from hitting her with her forearm.  She is concerned that there may be "something there ".  We will refer her for an x-ray and I have told her that we may not be able to pick up anything and I am not sure if they will be able to ultrasound but we will begin with testing determine what direction thereafter.

## 2021-04-08 ENCOUNTER — Ambulatory Visit: Payer: Medicaid Other

## 2021-04-08 ENCOUNTER — Other Ambulatory Visit: Payer: Self-pay

## 2021-04-08 DIAGNOSIS — M79672 Pain in left foot: Secondary | ICD-10-CM | POA: Diagnosis not present

## 2021-04-08 NOTE — Therapy (Signed)
[facility] [address] [place], [place], [address] Phone: [phone]   Fax:  [phone]  Physical Therapy Treatment  Patient Details  Name: Cheyenne Bean MRN: [id number] Date of Birth: June 27, 1956 Referring Provider (PT): Dr. Gardiner Fanti   Encounter Date: 04/08/2021   PT End of Session - 04/08/21 0936    Visit Number 17    Number of Visits 33    Date for PT Re-Evaluation 05/06/21    Authorization Type eval: 0/03/70, PN(and recert): 4/88/89; FOTO predicted visit count 14    PT Start Time 0845    PT Stop Time 0930    PT Time Calculation (min) 45 min    Activity Tolerance Patient tolerated treatment well    Behavior During Therapy [facility] for tasks assessed/performed           Past Medical History:  Diagnosis Date  . BRCA negative 01/2017   MyRisk neg  . Family history of breast cancer    1/18 IBIS=10.7%  . Family history of ovarian cancer    MyRisk neg  . Genital herpes   . Hyperlipidemia   . Hypertension   . IBS (irritable bowel syndrome) 02/20/2017  . Sarcoidosis     Past Surgical History:  Procedure Laterality Date  . BACK SURGERY    . BUNIONECTOMY Left   . COLONOSCOPY  2012   cleared for 10 yrs  . COLONOSCOPY WITH PROPOFOL N/A 02/27/2017   Procedure: COLONOSCOPY WITH PROPOFOL;  Surgeon: Lollie Sails, MD;  Location: [facility] ENDOSCOPY;  Service: Endoscopy;  Laterality: N/A;  . VAGINAL HYSTERECTOMY      There were no vitals filed for this visit.   Subjective Assessment - 04/08/21 0935    Subjective Patient reports that she is doing well today. She denies any pain in L foot/ankle upon arrival. Her back is still "tight" today and she would like to use the TENS again    Pertinent History Pt reports that approximately 1 year ago she started having L foot pain. She was wearing a lot of flats around that time when the pain started started. The pain improved after approximately 1 week without any intervention.  She was visiting her dad in October 2021 and her foot pain flared up significantly again. She was unable ot put weight on her foot due to the pain and had to use crutches to help her walk. She went to the urgent care and was sent to general orthopedics who recommended orthotics and gave her two steroid shots on her medial foot near her L posterior tibial tendon. She reports improvement for 1-2 days and then the pain returned. After returning to [place] she saw general orthopedics who placed a medial heel wedge in her shoes. He also prescribed her Voltaren gel for topical anti-inflammatory use to be used up to 4 times/day. 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 The pain is located on the medial aspect of her L foot at the insertion on the medial navicular and she describes it as "achy." No other recent changes in medication or health. History of chronic back pain with LLE radicular pain to the calf. Pt reports she has had back surgery without improvement but does not recall what type of surgery she had. Pt has a history of sarcoidosis but states that she had a breathing test yesterday which was good.    Limitations Walking;Standing    How long can you stand comfortably? 5 minutes  Diagnostic tests MRI    Patient Stated Goals Improve foot pain so she can walk comfortably    Currently in Pain? No/denies              TREATMENT   Ther-ex NuStep L0 x 5 minutes for warm-up (unbilled); Standing Prostretch calf stretch 45s x 3; Standing bilateral heel raises on step x 10, no pain reported by therapist; L single leg heel raises on step 2 x 10, Forward BOSU lunges (round side up) leading with LLE 2 x 15; L single leg BOSU (round side up) balance45s x 2, denies pain; L single leg balance on Airex pad with ball toss to therapist, therapist tossing in multiple directions to challenge dynamic stability 45s x 2; Green tband resisted L ankle dorsiflexion, inversion, and eversion 2  x 15 each;   Manual Therapy Transverse friction massage applied to tibialis posterior tendon posterior to medial malleolusin longsitting to improve tissue extensibility x 5 minutes, no visible signs of skin irritationafterwardhowever pt does report discomfort during but much ess than previous sessions;   Electrical Stimulation, unbilled TENS applied to patient's lower lumbar spine bilaterally throughout session to help her with pain control. Utilizedlarge muscle default TENSsetting on Empi Continuum unitwith small pads at pt tolerated intensity of 9.5 bilaterally. Total time 30 minutes during exercise. All time unbilled;   Pt educated throughout session about proper posture and technique with exercises. Improved exercise technique, movement at target joints, use of target muscles after min to mod verbal, visual, tactile cues.   Patient making excellent progress towards her goals with decrease in pain and improved ability to walklonger distances with less pain. She denies any pain upon arrival although does have some pain toward the end of the session. She is able to progress to concentric L single leg heel raises without any increase in pain today.Continued with transverse friction massage to tibialis posterior tendon again today which is only minimally uncomfortable for patient today which is improvement from previous sessions. Will continue to progress strengthening for long term management of her pain. Pt encouraged to continue HEP. Shewill benefit from PT services to address deficits in L foot pain in order to return to full pain-free function at home.                            PT Short Term Goals - 03/11/21 1012      PT SHORT TERM GOAL #1   Title Pt will be independent with HEP in order to decrease foot pain and increase strength/ROM in order to improve pain-free function at home.    Time 4    Period Weeks    Status On-going    Target Date  04/08/21             PT Long Term Goals - 03/11/21 0918      PT LONG TERM GOAL #1   Title Pt will decrease worst pain as reported on NPRS by at least 3 points in order to demonstrate clinically significant reduction in foot pain.    Baseline 01/15/21: worst: 10/10, 03/11/21: 8/10    Time 8    Period Weeks    Status Partially Met    Target Date 05/06/21      PT LONG TERM GOAL #2   Title Pt will increase LEFS by at least 9 points in order to demonstrate significant improvement in lower extremity function.    Baseline 01/15/21: to be completed  at next visit; 03/11/21: 21/80    Time 8    Period Weeks    Status On-going    Target Date 05/06/21      PT LONG TERM GOAL #3   Title Pt will improve painless L ankle active eversion to within 2 degrees of R side in order to demonstrate improved ankle mobility with less pain    Baseline 01/15/21: R/L 18/12 Ankle Eversion (L ankle painful); 03/11/21: Deferred to next session    Time 8    Period Weeks    Status Deferred    Target Date 05/06/21      PT LONG TERM GOAL #4   Title Pt will improve FOTO to at least 50 in order to demonstrate significant improvement in L ankle function    Baseline 01/15/21: 26; 03/11/21: 29    Time 8    Period Weeks    Status Partially Met    Target Date 05/06/21                 Plan - 04/08/21 0936    Clinical Impression Statement Patient making excellent progress towards her goals with decrease in pain and improved ability to walk longer distances with less pain. She denies any pain upon arrival although does have some pain toward the end of the session. She is able to progress to concentric L single leg heel raises without any increase in pain today. Continued with transverse friction massage to tibialis posterior tendon again today which is only minimally uncomfortable for patient today which is improvement from previous sessions. Will continue to progress strengthening for long term management of her pain.  Pt encouraged to continue HEP. She will benefit from PT services to address deficits in L foot pain in order to return to full pain-free function at home.    Personal Factors and Comorbidities Time since onset of injury/illness/exacerbation;Past/Current Experience;Comorbidity 3+    Comorbidities HTN, hyperlipidemia, scarcoidosis, IBS    Examination-Activity Limitations Squat;Stand    Examination-Participation Restrictions Community Activity;Cleaning;Meal Prep    Stability/Clinical Decision Making Evolving/Moderate complexity    Rehab Potential Good    PT Frequency 2x / week    PT Duration 8 weeks    PT Treatment/Interventions Canalith Repostioning;Gait training;Stair training;Therapeutic activities;Balance training;Therapeutic exercise;Neuromuscular re-education;Patient/family education;Vestibular;Functional mobility training;ADLs/Self Care Home Management;Aquatic Therapy;Biofeedback;Electrical Stimulation;Cryotherapy;Iontophoresis 79m/ml Dexamethasone;Moist Heat;Ultrasound;Traction;Manual techniques;Passive range of motion;Dry needling;Taping;Joint Manipulations;Other (comment)   phonophoresis   PT Next Visit Plan continue exercises/stretches    PT Home Exercise Plan Access Code: [email or web address]   Consulted and Agree with Plan of Care Patient           Patient will benefit from skilled therapeutic intervention in order to improve the following deficits and impairments:  Pain,Abnormal gait,Difficulty walking  Visit Diagnosis: Pain in left foot     Problem List Patient Active Problem List   Diagnosis Date Noted  . Pure hypercholesterolemia 11/04/2016  . Essential hypertension 09/18/2015  . Hyperlipidemia 09/18/2015  . Recurrent genital herpes simplex 09/18/2015   JLyndel SafeHuprich PT, DPT, GCS  Javante Nilsson 04/08/2021, 9:52 AM   [facility]MT[facility] [place] [address]Phone: [phone]  Fax:  [phone] Name: AFallyn MunnerlynMRN: [id number] of Birth: 1Dec 19, 1957

## 2021-04-10 ENCOUNTER — Ambulatory Visit: Payer: Medicaid Other

## 2021-04-10 ENCOUNTER — Encounter: Payer: Self-pay | Admitting: Surgery

## 2021-04-10 ENCOUNTER — Other Ambulatory Visit: Payer: Self-pay

## 2021-04-10 ENCOUNTER — Ambulatory Visit: Payer: Medicaid Other | Admitting: Surgery

## 2021-04-10 ENCOUNTER — Other Ambulatory Visit: Payer: Self-pay | Admitting: Surgery

## 2021-04-10 VITALS — BP 149/91 | HR 83 | Temp 98.6°F | Ht 64.0 in | Wt 153.0 lb

## 2021-04-10 DIAGNOSIS — S50852A Superficial foreign body of left forearm, initial encounter: Secondary | ICD-10-CM | POA: Diagnosis not present

## 2021-04-10 DIAGNOSIS — M79672 Pain in left foot: Secondary | ICD-10-CM | POA: Diagnosis not present

## 2021-04-10 NOTE — Progress Notes (Signed)
04/10/2021  Reason for Visit:  Left forearm foreign body  Referring Provider:  Otilio Miu, MD  History of Present Illness: Cheyenne Bean is a 65 y.o. female presenting for evaluation of a left forearm foreign body.  The patient had a trauma to the left forearm with glass breaking on her left forearm creating some lacerations.  This was about 30+ years ago.  The patient reports that afterwards she did have one time when she had a foreign body removal of some retained glass.  Over the years, she had not had any other issues, but more recently, she's felt a small bump in the left forearm, next to a prior incision for FB removal.  She reports that the area is tender when she bumps it against something, though on its own it does not cause issues.  Denies any motor or sensory weakness.  She saw her PCP who ordered an XR of her left forearm. This found a 3 x 1 mm foreign body in the dorsal aspect of the ulnar soft tissue in the mid to distal forearm.  I have personally seen the images and agree with the findings.  She's interested in having this foreign body removed.  Denies any redness or induration over the area, denies any drainage or wounds.  Past Medical History: Past Medical History:  Diagnosis Date  . BRCA negative 01/2017   MyRisk neg  . Family history of breast cancer    1/18 IBIS=10.7%  . Family history of ovarian cancer    MyRisk neg  . Genital herpes   . Hyperlipidemia   . Hypertension   . IBS (irritable bowel syndrome) 02/20/2017  . Sarcoidosis      Past Surgical History: Past Surgical History:  Procedure Laterality Date  . BACK SURGERY    . BUNIONECTOMY Left   . COLONOSCOPY  2012   cleared for 10 yrs  . COLONOSCOPY WITH PROPOFOL N/A 02/27/2017   Procedure: COLONOSCOPY WITH PROPOFOL;  Surgeon: Lollie Sails, MD;  Location: [facility] ENDOSCOPY;  Service: Endoscopy;  Laterality: N/A;  . VAGINAL HYSTERECTOMY      Home Medications: Prior to Admission medications    Medication Sig Start Date End Date Taking? Authorizing Provider  amLODipine (NORVASC) 5 MG tablet Take 1 tablet by mouth daily. Nehemiah Massed 06/19/20 06/19/21 Yes [provider]  diclofenac sodium (VOLTAREN) 1 % GEL Apply 2 g topically 4 (four) times daily. 08/05/19  Yes Juline Patch, MD  famotidine (PEPCID) 20 MG tablet Take 1 tablet (20 mg total) by mouth 2 (two) times daily. 12/13/20  Yes Melynda Ripple, MD  FLUoxetine (PROZAC) 40 MG capsule Take 1 capsule by mouth daily. Behavioral health 11/07/20  Yes [provider]  pravastatin (PRAVACHOL) 20 MG tablet Take 1 tablet by mouth once daily 11/06/20  Yes Juline Patch, MD  valACYclovir (VALTREX) 500 MG tablet TAKE 1 TABLET BY MOUTH ONCE DAILY. APPOINTMENT NEEDED 03/05/21  Yes Juline Patch, MD    Allergies: No Known Allergies  Social History:  reports that she has never smoked. She has never used smokeless tobacco. She reports that she does not drink alcohol and does not use drugs.   Family History: Family History  Problem Relation Age of Onset  . Hypertension Mother   . Breast cancer Sister 10  . Cervical cancer Sister 68  . Ovarian cancer Maternal Aunt 60  . Colon cancer Maternal Uncle 50    Review of Systems: Review of Systems  Constitutional: Negative for chills  and fever.  HENT: Negative for hearing loss.   Respiratory: Negative for shortness of breath.   Cardiovascular: Negative for chest pain.  Gastrointestinal: Negative for abdominal pain, nausea and vomiting.  Genitourinary: Negative for dysuria.  Musculoskeletal: Positive for back pain. Negative for myalgias.  Skin: Negative for rash.  Neurological: Negative for sensory change and focal weakness.  Psychiatric/Behavioral: Negative for depression.    Physical Exam BP (!) 149/91   Pulse 83   Temp 98.6 F (37 C)   Ht '5\' 4"'  (1.626 m)   Wt 153 lb (69.4 kg)   SpO2 97%   BMI 26.26 kg/m  CONSTITUTIONAL: No acute distress, well  nourished. HEENT:  Normocephalic, atraumatic, extraocular motion intact. NECK: Trachea is midline, and there is no jugular venous distension.  RESPIRATORY:  Lungs are clear, and breath sounds are equal bilaterally. Normal respiratory effort without pathologic use of accessory muscles. CARDIOVASCULAR: Heart is regular without murmurs, gallops, or rubs. GI: The abdomen is soft, non-distended, non-tender.  MUSCULOSKELETAL:  Normal muscle strength and tone in all four extremities.  No peripheral edema or cyanosis. SKIN: The patient has a small palpable raised area of the dorsal and lateral aspect of the mid left forearm.  This palpable area is only a few mm in length.  There is some discomfort when pushing deeply over this area.  The skin is not erythematous, not indurated, and there is no drainage. NEUROLOGIC:  Motor and sensation is grossly normal.  Cranial nerves are grossly intact. PSYCH:  Alert and oriented to person, place and time. Affect is normal.  Laboratory Analysis: No results found for this or any previous visit (from the past 24 hour(s)).  Imaging: Left forearm XR 04/04/21: IMPRESSION: 1. 3 x 1 mm radiopaque foreign body within the dorso ulnar soft tissues at the level of the mid to distal left forearm. 2. 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  Assessment and Plan: This is a 65 y.o. female with a small retained foreign body in the left forearm.  --Discussed with the patient I am able to palpate the area of concern and this appears to be where the foreign body is located on the XR.  Discussed with her the role for excision of this foreign body and discusses the risks of bleeding, infection, and injury to surrounding structures.  She's willing to proceed.   Procedure Date:  04/10/2021  Pre-operative Diagnosis:  Retained left forearm foreign body  Post-operative Diagnosis:  Retained left forearm foreign body  Procedure:  Excision of left forearm foreign  body  Surgeon:  Melvyn Neth, MD  Assistant:  Signa Kell, PA-S  Anesthesia:  7 ml of 1% lidocaine with epi.  Estimated Blood Loss:  5 ml  Specimens:  Subcutaneous tissue, possible retained foreign body.  Complications:  None  Indications for Procedure:  This is a 65 y.o. female with diagnosis of a symptomatic retained foreign body in the left forearm.  The patient wishes to have this excised. The risks of bleeding, abscess or infection, injury to surrounding structures, and need for further procedures were all discussed with the patient and she was willing to proceed.  Description of Procedure: The patient was correctly identified at bedside.  The patient was placed supine.  Appropriate time-outs were performed.  The patient's left forearm was prepped and draped in usual sterile fashion.  Local anesthetic was infused intradermally.  A 1.7 cm incision was made over the palpable area of concern, and scalpel was used to  dissect down the skin and subcutaneous tissue.  Skin flaps were created sharply.  A core of subcutaneous tissue along with scar tissue from a prior excision were excised sharply with scalpel.  On palpation of this tissue, there was no palpable foreign body.  As a precaution, it was still sent to pathology.  On palpation of the patient's wound bed, there are no areas of concern.  The cavity was then irrigated and hemostasis was assured with manual pressure.  The wound was then closed in two layers using 3-0 Vicryl and 4-0 Monocryl.  The incision was cleaned and sealed with DermaBond.  The patient tolerated the procedure well and all sharps were appropriately disposed of at the end of the case.  --Patient may shower tomorrow. --May take Tylenol and Ibuprofen for pain control. --Follow up 1 week for wound check.  Face-to-face time spent with the patient and care providers was 60 minutes, with more than 50% of the time spent counseling, educating, and coordinating care of  the patient.     Melvyn Neth, [facility]

## 2021-04-10 NOTE — Patient Instructions (Addendum)
We have removed a foreign body from your arm in our office today.  You have sutures under the skin that will dissolve and also dermabond (skin glue) on top of your skin which will come off on it's own in 10-14 days.  You may shower tomorrow, do not rub the area.  You may use Ibuprofen or Tylenol for any pain. Apply ice pack to the area several times a day for the first 2 days.  Avoid Strenuous activities that will make you sweat during the next 48 hours to avoid the glue coming off prematurely. Avoid activities that will place pressure to this area of the body for 1-2 weeks to avoid re-injury to incision site.  Please see your follow-up appointment provided. We will see you back in office to make sure this area is healed. If you have any questions or concerns prior to this appointment, call our office and speak with a nurse.

## 2021-04-10 NOTE — Therapy (Signed)
[facility] [address]. [place], [place], [address] Phone: [phone]   Fax:  [phone]  Physical Therapy Treatment  Patient Details  Name: Cheyenne Bean MRN: [id number] Date of Birth: 1956-01-10 Referring Provider (PT): Dr. Gardiner Fanti   Encounter Date: 04/10/2021   PT End of Session - 04/10/21 1419    Visit Number 18    Number of Visits 33    Date for PT Re-Evaluation 05/06/21    Authorization Type eval: 1/55/20, PN(and recert): 07/23/22; FOTO predicted visit count 14    PT Start Time 0845    PT Stop Time 0930    PT Time Calculation (min) 45 min    Activity Tolerance Patient tolerated treatment well    Behavior During Therapy [facility] for tasks assessed/performed           Past Medical History:  Diagnosis Date  . BRCA negative 01/2017   MyRisk neg  . Family history of breast cancer    1/18 IBIS=10.7%  . Family history of ovarian cancer    MyRisk neg  . Genital herpes   . Hyperlipidemia   . Hypertension   . IBS (irritable bowel syndrome) 02/20/2017  . Sarcoidosis     Past Surgical History:  Procedure Laterality Date  . BACK SURGERY    . BUNIONECTOMY Left   . COLONOSCOPY  2012   cleared for 10 yrs  . COLONOSCOPY WITH PROPOFOL N/A 02/27/2017   Procedure: COLONOSCOPY WITH PROPOFOL;  Surgeon: Lollie Sails, MD;  Location: [facility] ENDOSCOPY;  Service: Endoscopy;  Laterality: N/A;  . VAGINAL HYSTERECTOMY      There were no vitals filed for this visit.   Subjective Assessment - 04/10/21 1418    Subjective Patient reports that she is doing well today. She denies any pain in L foot/ankle upon arrival. She purchased a TENS unit and would like therapist to teach her how to set it up for her back pain.    Pertinent History Pt reports that approximately 1 year ago she started having L foot pain. She was wearing a lot of flats around that time when the pain started started. The pain improved after approximately 1 week  without any intervention. She was visiting her dad in October 2021 and her foot pain flared up significantly again. She was unable ot put weight on her foot due to the pain and had to use crutches to help her walk. She went to the urgent care and was sent to general orthopedics who recommended orthotics and gave her two steroid shots on her medial foot near her L posterior tibial tendon. She reports improvement for 1-2 days and then the pain returned. After returning to [place] she saw general orthopedics who placed a medial heel wedge in her shoes. He also prescribed her Voltaren gel for topical anti-inflammatory use to be used up to 4 times/day. 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 The pain is located on the medial aspect of her L foot at the insertion on the medial navicular and she describes it as "achy." No other recent changes in medication or health. History of chronic back pain with LLE radicular pain to the calf. Pt reports she has had back surgery without improvement but does not recall what type of surgery she had. Pt has a history of sarcoidosis but states that she had a breathing test yesterday which was good.    Limitations Walking;Standing    How long can  you stand comfortably? 5 minutes    Diagnostic tests MRI    Patient Stated Goals Improve foot pain so she can walk comfortably    Currently in Pain? No/denies              TREATMENT   Ther-ex NuStep L0 x 5 minutes for warm-up during history and education about how to use her new TENS unit (settings, time, accomodation, etc.); Standing Prostretch calf stretch 45s x 3; L single leg heel raises on step 3 x 10, L single leg balance on Airex pad with ball toss into rebounder 3 x 45s; 1/2 foam roll tandem balance with L foot as rear foot 3 x 45s; Green tband resisted L ankle dorsiflexion, inversion, and eversion 2 x 20 each direction;   Manual Therapy Transverse friction massage applied to tibialis  posterior tendon posterior to medial malleolusin longsitting to improve tissue extensibility x 5 minutes, no visible signs of skin irritationafterwardhowever pt does report discomfort during but much ess than previous sessions;   Electrical Stimulation, unbilled TENS applied to patient's lower lumbar spine bilaterally throughout session to help her with pain control. Used 2 channel modulation setting, '120Hz' , 60 usec x 40 minutes during session to help with back pain in order to allow pt complete exercises. Utilized pt tolerated intensity of 3 bilaterally. All time unbilled;   Pt educated throughout session about proper posture and technique with exercises. Improved exercise technique, movement at target joints, use of target muscles after min to mod verbal, visual, tactile cues.   Patient making excellent progress towards her goals with decrease in pain and improved ability to walklonger distances with less pain. She denies any pain upon arrival and during the session. She is able to perform 3 sets of 10 L single leg heel raises without any increase in pain. She remains a little tender over her tibialis posterior insertion but again it is much improved compared to previous sessions.  If pt remains pain-free plan will be for discharge next week. Will continue to progress strengthening for long term management of her pain.Pt encouraged to continue HEP. Shewill benefit from PT services to address deficits in L foot pain in order to return to full pain-free function at home.                           PT Short Term Goals - 03/11/21 1012      PT SHORT TERM GOAL #1   Title Pt will be independent with HEP in order to decrease foot pain and increase strength/ROM in order to improve pain-free function at home.    Time 4    Period Weeks    Status On-going    Target Date 04/08/21             PT Long Term Goals - 03/11/21 0918      PT LONG TERM GOAL #1   Title  Pt will decrease worst pain as reported on NPRS by at least 3 points in order to demonstrate clinically significant reduction in foot pain.    Baseline 01/15/21: worst: 10/10, 03/11/21: 8/10    Time 8    Period Weeks    Status Partially Met    Target Date 05/06/21      PT LONG TERM GOAL #2   Title Pt will increase LEFS by at least 9 points in order to demonstrate significant improvement in lower extremity function.    Baseline 01/15/21: to be  completed at next visit; 03/11/21: 21/80    Time 8    Period Weeks    Status On-going    Target Date 05/06/21      PT LONG TERM GOAL #3   Title Pt will improve painless L ankle active eversion to within 2 degrees of R side in order to demonstrate improved ankle mobility with less pain    Baseline 01/15/21: R/L 18/12 Ankle Eversion (L ankle painful); 03/11/21: Deferred to next session    Time 8    Period Weeks    Status Deferred    Target Date 05/06/21      PT LONG TERM GOAL #4   Title Pt will improve FOTO to at least 50 in order to demonstrate significant improvement in L ankle function    Baseline 01/15/21: 26; 03/11/21: 29    Time 8    Period Weeks    Status Partially Met    Target Date 05/06/21                 Plan - 04/10/21 1420    Clinical Impression Statement Patient making excellent progress towards her goals with decrease in pain and improved ability to walk longer distances with less pain. She denies any pain upon arrival and during the session. She is able to perform 3 sets of 10 L single leg heel raises without any increase in pain. She remains a little tender over her tibialis posterior insertion but again it is much improved compared to previous sessions.  If pt remains pain-free plan will be for discharge next week. Will continue to progress strengthening for long term management of her pain. Pt encouraged to continue HEP. She will benefit from PT services to address deficits in L foot pain in order to return to full pain-free  function at home.    Personal Factors and Comorbidities Time since onset of injury/illness/exacerbation;Past/Current Experience;Comorbidity 3+    Comorbidities HTN, hyperlipidemia, scarcoidosis, IBS    Examination-Activity Limitations Squat;Stand    Examination-Participation Restrictions Community Activity;Cleaning;Meal Prep    Stability/Clinical Decision Making Evolving/Moderate complexity    Rehab Potential Good    PT Frequency 2x / week    PT Duration 8 weeks    PT Treatment/Interventions Canalith Repostioning;Gait training;Stair training;Therapeutic activities;Balance training;Therapeutic exercise;Neuromuscular re-education;Patient/family education;Vestibular;Functional mobility training;ADLs/Self Care Home Management;Aquatic Therapy;Biofeedback;Electrical Stimulation;Cryotherapy;Iontophoresis 40m/ml Dexamethasone;Moist Heat;Ultrasound;Traction;Manual techniques;Passive range of motion;Dry needling;Taping;Joint Manipulations;Other (comment)   phonophoresis   PT Next Visit Plan continue exercises/stretches    PT Home Exercise Plan Access Code: [email or web address]   Consulted and Agree with Plan of Care Patient           Patient will benefit from skilled therapeutic intervention in order to improve the following deficits and impairments:  Pain,Abnormal gait,Difficulty walking  Visit Diagnosis: Pain in left foot     Problem List Patient Active Problem List   Diagnosis Date Noted  . Pure hypercholesterolemia 11/04/2016  . Essential hypertension 09/18/2015  . Hyperlipidemia 09/18/2015  . Recurrent genital herpes simplex 09/18/2015   JPhillips GroutPT, DPT, GCS  Ora Bollig 04/10/2021, 2:31 PM   [facility] [place] [address]Phone: [phone]  Fax:  [phone] Name: AMadolyn AckroydMRN: [id number] of Birth: [birth year]

## 2021-04-15 ENCOUNTER — Other Ambulatory Visit: Payer: Self-pay

## 2021-04-15 ENCOUNTER — Ambulatory Visit: Payer: Medicaid Other

## 2021-04-15 DIAGNOSIS — M79672 Pain in left foot: Secondary | ICD-10-CM

## 2021-04-15 NOTE — Therapy (Signed)
[facility] [address] [place], [place], [address] Phone: [phone]   Fax:  [phone]  Physical Therapy Treatment/Goal Update  Patient Details  Name: Cheyenne Bean MRN: [id number] Date of Birth: [birth year] Referring Provider (PT): Dr. Gardiner Fanti   Encounter Date: 04/15/2021   PT End of Session - 04/15/21 0852    Visit Number 19    Number of Visits 33    Date for PT Re-Evaluation 05/06/21    Authorization Type eval: 1/44/31, PN(and recert): 5/40/08; FOTO predicted visit count 14    PT Start Time 0847    PT Stop Time 0930    PT Time Calculation (min) 43 min    Activity Tolerance Patient tolerated treatment well    Behavior During Therapy [facility] for tasks assessed/performed           Past Medical History:  Diagnosis Date  . BRCA negative 01/2017   MyRisk neg  . Family history of breast cancer    1/18 IBIS=10.7%  . Family history of ovarian cancer    MyRisk neg  . Genital herpes   . Hyperlipidemia   . Hypertension   . IBS (irritable bowel syndrome) 02/20/2017  . Sarcoidosis     Past Surgical History:  Procedure Laterality Date  . BACK SURGERY    . BUNIONECTOMY Left   . COLONOSCOPY  2012   cleared for 10 yrs  . COLONOSCOPY WITH PROPOFOL N/A 02/27/2017   Procedure: COLONOSCOPY WITH PROPOFOL;  Surgeon: Lollie Sails, MD;  Location: [facility] ENDOSCOPY;  Service: Endoscopy;  Laterality: N/A;  . VAGINAL HYSTERECTOMY      There were no vitals filed for this visit.   Subjective Assessment - 04/15/21 0851    Subjective Patient reports that she is doing well today. She denies any pain in L foot/ankle upon arrival. She still gets moderate L foot/ankle pain occasionally.    Pertinent History Pt reports that approximately 1 year ago she started having L foot pain. She was wearing a lot of flats around that time when the pain started started. The pain improved after approximately 1 week without any intervention. She  was visiting her dad in October 2021 and her foot pain flared up significantly again. She was unable ot put weight on her foot due to the pain and had to use crutches to help her walk. She went to the urgent care and was sent to general orthopedics who recommended orthotics and gave her two steroid shots on her medial foot near her L posterior tibial tendon. She reports improvement for 1-2 days and then the pain returned. After returning to [place] she saw general orthopedics who placed a medial heel wedge in her shoes. He also prescribed her Voltaren gel for topical anti-inflammatory use to be used up to 4 times/day. 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 The pain is located on the medial aspect of her L foot at the insertion on the medial navicular and she describes it as "achy." No other recent changes in medication or health. History of chronic back pain with LLE radicular pain to the calf. Pt reports she has had back surgery without improvement but does not recall what type of surgery she had. Pt has a history of sarcoidosis but states that she had a breathing test yesterday which was good.    Limitations Walking;Standing    How long can you stand comfortably? 5 minutes    Diagnostic tests MRI  Patient Stated Goals Improve foot pain so she can walk comfortably    Currently in Pain? No/denies             TREATMENT     Ther-ex   NuStep L2 x 5 minutes for warm-up during history (4 minutes unbilled); Updated goals with patient including worst pain (4/10), LEFS (23/80), and FOTO (36) Standing Prostretch calf stretch 45s x 3; L single leg heel raises on step x 10, increase in pain so regressed to double leg heel raises 2 x 10; Attempted L single leg balance on Airex pad with ball toss into rebounder x 30s, pain in L forearm starts to increase so discontinued; L single leg Airex balance with cone taps in multiple directions; 1/2 foam roll tandem balance with L foot as rear  foot 3 x 45s; Green tband resisted L ankle dorsiflexion, inversion, and eversion x 20 each direction;   Manual Therapy Transverse friction massage applied to tibialis posterior tendon posterior to medial malleolusin longsitting to improve tissue extensibility x 5 minutes, no visible signs of skin irritationafterwardhowever pt does report discomfort during but much ess than previous sessions;    Pt educated throughout session about proper posture and technique with exercises. Improved exercise technique, movement at target joints, use of target muscles after min to mod verbal, visual, tactile cues.   Pt reports continued discomfort/pain in L foot however her worst pain has improved from 10/10 when first starting therapy to 4/10 today. Her FOTO score has improved from 26 initially to 36 today. LEFS score of 21/80 initially to 23/80 today.  Overall she has made significant progress since starting therapy however pain in L foot/ankle persists. Plan is to space out therapy session to 1x/wk with pt remaining diligent with her HEP. Patient's condition has the potential to improve in response to therapy. Maximum improvement is yet to be obtained. The anticipated improvement is attainable and reasonable in a generally predictable time. Shewill benefit from PT services to address deficits in L foot/ankle pain in order to return to full pain-free function at home.                            PT Short Term Goals - 04/15/21 1026      PT SHORT TERM GOAL #1   Title Pt will be independent with HEP in order to decrease foot pain and increase strength/ROM in order to improve pain-free function at home.    Time 4    Period Weeks    Status On-going    Target Date 04/08/21             PT Long Term Goals - 04/15/21 1027      PT LONG TERM GOAL #1   Title Pt will decrease worst pain as reported on NPRS by at least 3 points in order to demonstrate clinically significant reduction  in foot pain.    Baseline 01/15/21: worst: 10/10, 03/11/21: 8/10; 04/15/21: 4/10;    Time 8    Period Weeks    Status Achieved      PT LONG TERM GOAL #2   Title Pt will increase LEFS by at least 9 points in order to demonstrate significant improvement in lower extremity function.    Baseline 01/15/21: to be completed at next visit; 03/11/21: 21/80; 04/15/21: 23/80    Time 8    Period Weeks    Status Partially Met    Target Date 05/06/21  PT LONG TERM GOAL #3   Title Pt will improve painless L ankle active eversion to within 2 degrees of R side in order to demonstrate improved ankle mobility with less pain    Baseline 01/15/21: R/L 18/12 Ankle Eversion (L ankle painful); 03/11/21: Deferred to next session; 04/15/21: Deferred    Time 8    Period Weeks    Status Deferred    Target Date 05/06/21      PT LONG TERM GOAL #4   Title Pt will improve FOTO to at least 50 in order to demonstrate significant improvement in L ankle function    Baseline 01/15/21: 26; 03/11/21: 29; 04/15/21: 36;    Time 8    Period Weeks    Status Partially Met    Target Date 05/06/21                 Plan - 04/15/21 1026    Clinical Impression Statement Pt reports continued discomfort/pain in L foot however her worst pain has improved from 10/10 when first starting therapy to 4/10 today. Her FOTO score has improved from 26 initially to 36 today. LEFS score of 21/80 initially to 23/80 today.  Overall she has made significant progress since starting therapy however pain in L foot/ankle persists. Plan is to space out therapy session to 1x/wk with pt remaining diligent with her HEP. Patient's condition has the potential to improve in response to therapy. Maximum improvement is yet to be obtained. The anticipated improvement is attainable and reasonable in a generally predictable time. She will benefit from PT services to address deficits in L foot/ankle pain in order to return to full pain-free function at home.     Personal Factors and Comorbidities Time since onset of injury/illness/exacerbation;Past/Current Experience;Comorbidity 3+    Comorbidities HTN, hyperlipidemia, scarcoidosis, IBS    Examination-Activity Limitations Squat;Stand    Examination-Participation Restrictions Community Activity;Cleaning;Meal Prep    Stability/Clinical Decision Making Evolving/Moderate complexity    Rehab Potential Good    PT Frequency 2x / week    PT Duration 8 weeks    PT Treatment/Interventions Canalith Repostioning;Gait training;Stair training;Therapeutic activities;Balance training;Therapeutic exercise;Neuromuscular re-education;Patient/family education;Vestibular;Functional mobility training;ADLs/Self Care Home Management;Aquatic Therapy;Biofeedback;Electrical Stimulation;Cryotherapy;Iontophoresis 4mg/ml Dexamethasone;Moist Heat;Ultrasound;Traction;Manual techniques;Passive range of motion;Dry needling;Taping;Joint Manipulations;Other (comment)   phonophoresis   PT Next Visit Plan Progress note, continue exercises/stretches    PT Home Exercise Plan Access Code: [email or web address]    Consulted and Agree with Plan of Care Patient           Patient will benefit from skilled therapeutic intervention in order to improve the following deficits and impairments:  Pain,Abnormal gait,Difficulty walking  Visit Diagnosis: Pain in left foot     Problem List Patient Active Problem List   Diagnosis Date Noted  . Pure hypercholesterolemia 11/04/2016  . Essential hypertension 09/18/2015  . Hyperlipidemia 09/18/2015  . Recurrent genital herpes simplex 09/18/2015   Jason D Huprich PT, DPT, GCS  Huprich,Jason 04/15/2021, 10:53 AM  [facility] [address] [place], [place], [address] Phone: [phone]   Fax:  [phone]  Name: Cheyenne Bean MRN: [id number] Date of Birth: 02/27/1956   

## 2021-04-16 NOTE — Patient Instructions (Signed)
Physical Therapy Progress Note (need ankle ROM but other goals were just updated)   Dates of reporting period  ***   to   ***   TREATMENT   Ther-ex NuStep L2 x 5 minutes for warm-up during history (4 minutes unbilled); Updated goals with patient including worst pain (4/10), LEFS (23/80), and FOTO (36) Standing Prostretch calf stretch 45s x 3; L single leg heel raises on step x 10, increase in pain so regressed to double leg heel raises 2 x 10; Attempted L single leg balance on Airex pad with ball toss into rebounder x 30s, pain in L forearm starts to increase so discontinued; L single leg Airex balance with cone taps in multiple directions; 1/2 foam roll tandem balance with L foot as rear foot 3 x 45s; Green tband resisted L ankle dorsiflexion, inversion, and eversion x 20each direction;   Manual Therapy Transverse friction massage applied to tibialis posterior tendon posterior to medial malleolusin longsitting to improve tissue extensibility x 5 minutes, no visible signs of skin irritationafterwardhowever pt does report discomfort during but much ess than previous sessions;   Pt educated throughout session about proper posture and technique with exercises. Improved exercise technique, movement at target joints, use of target muscles after min to mod verbal, visual, tactile cues.   Pt reports continued discomfort/pain in L foot however her worst pain has improved from 10/10 when first starting therapy to 4/10 today. Her FOTO score has improved from 26 initially to 36 today. LEFS score of 21/80 initially to 23/80 today.  Overall she has made significant progress since starting therapy however pain in L foot/ankle persists.Plan is to space out therapy session to 1x/wk with pt remaining diligent with her HEP. Patient's condition has the potential to improve in response to therapy. Maximum improvement is yet to be obtained. The anticipated improvement is attainable and  reasonable in a generally predictable time. Shewill benefit from PT services to address deficits in L foot/ankle pain in order to return to full pain-free function at home.

## 2021-04-17 ENCOUNTER — Ambulatory Visit: Payer: Medicaid Other

## 2021-04-17 ENCOUNTER — Other Ambulatory Visit: Payer: Self-pay

## 2021-04-17 DIAGNOSIS — M79672 Pain in left foot: Secondary | ICD-10-CM | POA: Diagnosis not present

## 2021-04-17 NOTE — Therapy (Signed)
[facility] [address]. [place], [place], [address] Phone: [phone]   Fax:  [phone]  Physical Therapy Progress Note   Dates of reporting period  03/11/21  to   04/17/21  Patient Details  Name: Cheyenne Bean MRN: [id number] Date of Birth: 12/05/1956 Referring Provider (PT): Dr. Gardiner Fanti   Encounter Date: 04/17/2021   PT End of Session - 04/17/21 1327    Visit Number 20    Number of Visits 33    Date for PT Re-Evaluation 05/06/21    Authorization Type eval: 3/82/50, PN(and recert): 5/39/76; FOTO predicted visit count 14    PT Start Time 1316    PT Stop Time 1400    PT Time Calculation (min) 44 min    Activity Tolerance Patient tolerated treatment well    Behavior During Therapy [facility] for tasks assessed/performed           Past Medical History:  Diagnosis Date  . BRCA negative 01/2017   MyRisk neg  . Family history of breast cancer    1/18 IBIS=10.7%  . Family history of ovarian cancer    MyRisk neg  . Genital herpes   . Hyperlipidemia   . Hypertension   . IBS (irritable bowel syndrome) 02/20/2017  . Sarcoidosis     Past Surgical History:  Procedure Laterality Date  . BACK SURGERY    . BUNIONECTOMY Left   . COLONOSCOPY  2012   cleared for 10 yrs  . COLONOSCOPY WITH PROPOFOL N/A 02/27/2017   Procedure: COLONOSCOPY WITH PROPOFOL;  Surgeon: Lollie Sails, MD;  Location: [facility] ENDOSCOPY;  Service: Endoscopy;  Laterality: N/A;  . VAGINAL HYSTERECTOMY      There were no vitals filed for this visit.   Subjective Assessment - 04/17/21 1319    Subjective Patient reports that she is doing alright today. She denies any pain in L foot/ankle upon arrival but she continues having toe pain. No specific questions currently.    Pertinent History Pt reports that approximately 1 year ago she started having L foot pain. She was wearing a lot of flats around that time when the pain started started. The pain  improved after approximately 1 week without any intervention. She was visiting her dad in October 2021 and her foot pain flared up significantly again. She was unable ot put weight on her foot due to the pain and had to use crutches to help her walk. She went to the urgent care and was sent to general orthopedics who recommended orthotics and gave her two steroid shots on her medial foot near her L posterior tibial tendon. She reports improvement for 1-2 days and then the pain returned. After returning to [place] she saw general orthopedics who placed a medial heel wedge in her shoes. He also prescribed her Voltaren gel for topical anti-inflammatory use to be used up to 4 times/day. 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 The pain is located on the medial aspect of her L foot at the insertion on the medial navicular and she describes it as "achy." No other recent changes in medication or health. History of chronic back pain with LLE radicular pain to the calf. Pt reports she has had back surgery without improvement but does not recall what type of surgery she had. Pt has a history of sarcoidosis but states that she had a breathing test yesterday which was good.    Limitations Walking;Standing  How long can you stand comfortably? 5 minutes    Diagnostic tests MRI    Patient Stated Goals Improve foot pain so she can walk comfortably    Currently in Pain? No/denies             TREATMENT   Ther-ex NuStep L2 x 5 minutes for warm-up during history (4 minutes unbilled); Standing Prostretch calf stretch 45s x 3; Double leg heel raises x 20, L single leg raise x 20; Airex tandem balance with ball tosses to therapist 30s x 2; Airex balance beam side stepping x 6 lengths; L single leg forward BOSU lunges (round side up) x 15; BOSU (flat side up) static balance x 30s; BOSU (flat side up) squats x 10; L single leg heel taps on 6" step 2 x 10;   Manual Therapy Transverse  friction massage applied to tibialis posterior tendon posterior to medial malleolusin longsitting to improve tissue extensibility x 5 minutes, no visible signs of skin irritationafterward.   Pt educated throughout session about proper posture and technique with exercises. Improved exercise technique, movement at target joints, use of target muscles after min to mod verbal, visual, tactile cues.   Pt reports continued discomfort/pain in L foot however her worst pain has improved from 10/10 when first starting therapy to 4/10 when goals last updated. Her FOTO score had improved from 26 initially to 36. LEFS score increased from 21/80 initially to 23/80.  Overall she has made significant progress since starting therapy however she continues intermittent foot/ankle pain.Plan is to space out therapy session to 1x/wk with pt continuing her HEP. Patient's condition has the potential to improve in response to therapy. Maximum improvement is yet to be obtained. The anticipated improvement is attainable and reasonable in a generally predictable time. Shewill benefit from PT services to address deficits in L foot/ankle pain in order to return to full pain-free function at home.                          PT Short Term Goals - 04/17/21 1414      PT SHORT TERM GOAL #1   Title Pt will be independent with HEP in order to decrease foot pain and increase strength/ROM in order to improve pain-free function at home.    Time 4    Period Weeks    Status On-going    Target Date 04/08/21             PT Long Term Goals - 04/17/21 1414      PT LONG TERM GOAL #1   Title Pt will decrease worst pain as reported on NPRS by at least 3 points in order to demonstrate clinically significant reduction in foot pain.    Baseline 01/15/21: worst: 10/10, 03/11/21: 8/10; 04/15/21: 4/10;    Time 8    Period Weeks    Status Achieved      PT LONG TERM GOAL #2   Title Pt will increase LEFS by at  least 9 points in order to demonstrate significant improvement in lower extremity function.    Baseline 01/15/21: to be completed at next visit; 03/11/21: 21/80; 04/15/21: 23/80    Time 8    Period Weeks    Status Partially Met    Target Date 05/06/21      PT LONG TERM GOAL #3   Title Pt will improve painless L ankle active eversion to within 2 degrees of R side in order to demonstrate  improved ankle mobility with less pain    Baseline 01/15/21: R/L 18/12 Ankle Eversion (L ankle painful); 03/11/21: Deferred to next session; 04/15/21: Deferred    Time 8    Period Weeks    Status Deferred    Target Date 05/06/21      PT LONG TERM GOAL #4   Title Pt will improve FOTO to at least 50 in order to demonstrate significant improvement in L ankle function    Baseline 01/15/21: 26; 03/11/21: 29; 04/15/21: 36;    Time 8    Period Weeks    Status Partially Met    Target Date 05/06/21                 Plan - 04/17/21 1327    Clinical Impression Statement Pt reports continued discomfort/pain in L foot however her worst pain has improved from 10/10 when first starting therapy to 4/10 when goals last updated. Her FOTO score had improved from 26 initially to 36. LEFS score increased from 21/80 initially to 23/80.  Overall she has made significant progress since starting therapy however she continues intermittent foot/ankle pain. Plan is to space out therapy session to 1x/wk with pt continuing her HEP. Patient's condition has the potential to improve in response to therapy. Maximum improvement is yet to be obtained. The anticipated improvement is attainable and reasonable in a generally predictable time. She will benefit from PT services to address deficits in L foot/ankle pain in order to return to full pain-free function at home.    Personal Factors and Comorbidities Time since onset of injury/illness/exacerbation;Past/Current Experience;Comorbidity 3+    Comorbidities HTN, hyperlipidemia, scarcoidosis, IBS     Examination-Activity Limitations Squat;Stand    Examination-Participation Restrictions Community Activity;Cleaning;Meal Prep    Stability/Clinical Decision Making Evolving/Moderate complexity    Rehab Potential Good    PT Frequency 2x / week    PT Duration 8 weeks    PT Treatment/Interventions Canalith Repostioning;Gait training;Stair training;Therapeutic activities;Balance training;Therapeutic exercise;Neuromuscular re-education;Patient/family education;Vestibular;Functional mobility training;ADLs/Self Care Home Management;Aquatic Therapy;Biofeedback;Electrical Stimulation;Cryotherapy;Iontophoresis 37m/ml Dexamethasone;Moist Heat;Ultrasound;Traction;Manual techniques;Passive range of motion;Dry needling;Taping;Joint Manipulations;Other (comment)   phonophoresis   PT Next Visit Plan continue exercises/stretches    PT Home Exercise Plan Access Code: [email or web address]   Consulted and Agree with Plan of Care Patient           Patient will benefit from skilled therapeutic intervention in order to improve the following deficits and impairments:  Pain,Abnormal gait,Difficulty walking  Visit Diagnosis: Pain in left foot     Problem List Patient Active Problem List   Diagnosis Date Noted  . Pure hypercholesterolemia 11/04/2016  . Essential hypertension 09/18/2015  . Hyperlipidemia 09/18/2015  . Recurrent genital herpes simplex 09/18/2015   JLyndel SafeHuprich PT, DPT, GCS  Hiawatha Merriott 04/17/2021, 9:02 PM  [facility] [place] [address]Phone: [phone]  Fax:  [phone] Name: AMylissa LambeMRN: [id number] of Birth: [birth year]

## 2021-04-19 ENCOUNTER — Encounter: Payer: Self-pay | Admitting: Surgery

## 2021-04-19 ENCOUNTER — Ambulatory Visit (INDEPENDENT_AMBULATORY_CARE_PROVIDER_SITE_OTHER): Payer: Medicaid Other | Admitting: Surgery

## 2021-04-19 ENCOUNTER — Other Ambulatory Visit: Payer: Self-pay

## 2021-04-19 VITALS — BP 147/88 | HR 71 | Temp 98.5°F | Ht 64.0 in | Wt 153.0 lb

## 2021-04-19 DIAGNOSIS — S50852A Superficial foreign body of left forearm, initial encounter: Secondary | ICD-10-CM

## 2021-04-19 DIAGNOSIS — Z09 Encounter for follow-up examination after completed treatment for conditions other than malignant neoplasm: Secondary | ICD-10-CM

## 2021-04-19 NOTE — Patient Instructions (Addendum)
Use ice packs to help with the pain and swelling. Do not use heat. Once the glue comes off, you may use Vitamin E lotion for scarring.   If you have any concerns or questions, please feel free to call our office.    Wound Closure Removal, Care After This sheet gives you information about how to care for yourself after your stitches (sutures), staples, or skin adhesives have been removed. Your health care provider may also give you more specific instructions. If you have problems or questions, contact your health care provider. What can I expect after the procedure? After your sutures or staples have been removed or your skin adhesives have fallen off, it is common to have:  Some discomfort and swelling in the area.  Slight redness in the area. Follow these instructions at home: If you have a bandage:  Wash your hands with soap and water before you change your bandage (dressing). If soap and water are not available, use hand sanitizer.  Change your dressing as told by your health care provider. If your dressing becomes wet or dirty, or develops a bad smell, change it as soon as possible.  If your dressing sticks to your skin, pour warm, clean water over it until it loosens and can be removed without pulling apart the wound edges. Pat the area dry with a soft, clean towel. Do not rub the wound because that may cause bleeding. Wound care  Keep the wound area dry and clean.  Check your wound every day for signs of infection. Check for: ? Redness, swelling, or pain. ? Fluid or blood. ? Warmth. ? Pus or a bad smell.  Wash your hands with soap and water before and after touching your wound.  Apply cream or ointment only as told by your health care provider. If you are using cream or ointment, wash the area with soap and water 2 times a day to remove all the cream or ointment. Rinse off the soap and pat the area dry with a clean towel.  If skin glue or adhesive strips were applied after  sutures or staples were removed, leave these closures in place until they peel off on their own. If adhesive strip edges start to loosen and curl up, you may trim the loose edges. Do not remove adhesive strips completely unless your health care provider tells you to do that.  Continue to protect the wound from injury.  Do not pick at your wound. Picking can cause an infection.   Bathing  Do not take baths, swim, or use a hot tub until your health care provider approves.  Ask your health care provider when it is okay to shower.  Follow these steps for showering: ? If you have a dressing, remove it before getting into the shower. ? In the shower, allow soapy water to get on the wound. Avoid scrubbing the wound. ? When you get out of the shower, dry the wound by patting it with a clean towel. ? Reapply a dressing over the wound if needed. Scar care  When your wound has completely healed, take actions to help decrease the size of your scar: ? Wear sunscreen over the scar or cover it with clothing when you are outside. New scars get sunburned easily, which can make scarring worse. ? Gently massage the scarred area. This can decrease scar thickness. General instructions  Take over-the-counter and prescription medicines only as told by your health care provider.  Keep all follow-up visits as  told by your health care provider. This is important. Contact a health care provider if:  You have redness, swelling, or pain around your wound.  You have fluid or blood coming from your wound.  Your wound feels warm to the touch.  You have pus or a bad smell coming from your wound.  Your wound opens up.  You have chills. Get help right away if:  You have a fever.  You have redness that is spreading from your wound. Summary  Change your dressing as told by your health care provider. If your dressing becomes wet or dirty, or develops a bad smell, change it as soon as possible.  Check your  wound every day for signs of infection.  Wash your hands with soap and water before and after touching your wound. This information is not intended to replace advice given to you by your health care provider. Make sure you discuss any questions you have with your health care provider. Document Revised: 02/16/2020 Document Reviewed: 09/28/2017 Elsevier Patient Education  2021 [employer].

## 2021-04-19 NOTE — Progress Notes (Signed)
04/19/2021  HPI: Cheyenne Bean is a 65 y.o. female s/p excision of left forearm foreign body on 04/10/21.  She presents for follow up.  She reports soreness and swelling over the incision area, which is tender.  Pathology from the excision only showed fatty tissue, without any foreign body.    Vital signs: BP (!) 147/88   Pulse 71   Temp 98.5 F (36.9 C) (Oral)   Ht 5\' 4"  (1.626 m)   Wt 153 lb (69.4 kg)   SpO2 95%   BMI 26.26 kg/m    Physical Exam: Constitutional:  No acute distress Skin:  Left forearm incision is healing well, clean, dry, intact.  There's resolving ecchymosis around the incision, and there's some edema/swelling of the area as well.  No signs of infection.  Assessment/Plan: This is a 65 y.o. female s/p excision of foreign body of left forearm.  --Discussed with the patient that the excision did not reveal any foreign body.  This was really small (only 3 x 1 mm on xray), and could have been missed, vs excised and not included in the pathology.  Potentially, what she was feeling could have been scar tissue that was removed with the specimen.  Currently given the swelling and bruising, it is difficult to assess for any retained foreign body.  Recommended that we wait about 2 months to allow everything to heal and the scar tissue to soften more, and if she still feels something is there, then we will obtain U/S to better evaluate and reattempt excision.  She's in agreement with this plan. --Follow up as needed.   70, MD  [facility]

## 2021-04-22 NOTE — Patient Instructions (Incomplete)
TREATMENT   Ther-ex NuStep L2 x 5 minutes for warm-up during history (4 minutes unbilled); Standing Prostretch calf stretch 45s x 3; Double leg heel raises x 20, L single leg raise x 20; Airex tandem balance with ball tosses to therapist 30s x 2; Airex balance beam side stepping x 6 lengths; L single leg forward BOSU lunges (round side up) x 15; BOSU (flat side up) static balance x 30s; BOSU (flat side up) squats x 10; L single leg heel taps on 6" step 2 x 10;   Manual Therapy Transverse friction massage applied to tibialis posterior tendon posterior to medial malleolusin longsitting to improve tissue extensibility x 5 minutes, no visible signs of skin irritationafterward.   Pt educated throughout session about proper posture and technique with exercises. Improved exercise technique, movement at target joints, use of target muscles after min to mod verbal, visual, tactile cues.   Pt reports continued discomfort/pain in L foot however her worst pain has improved from 10/10 when first starting therapy to4/10 when goals last updated. Her FOTO score had improved from 26 initially to36. LEFS score increased from 21/80 initially to 23/80. Overall she hasmade significantprogresssincestarting therapy however she continues intermittent foot/ankle pain.Plan is to space out therapy session to 1x/wk with pt continuing her HEP. Patient's condition has the potential to improve in response to therapy. Maximum improvement is yet to be obtained. The anticipated improvement is attainable and reasonable in a generally predictable time. Shewill benefit from PT services to address deficits in L foot/ankle pain in order to return to full pain-free function at home.

## 2021-04-24 ENCOUNTER — Ambulatory Visit: Payer: Medicaid Other | Attending: Orthopaedic Surgery

## 2021-04-24 ENCOUNTER — Other Ambulatory Visit: Payer: Self-pay

## 2021-04-24 DIAGNOSIS — M79672 Pain in left foot: Secondary | ICD-10-CM | POA: Diagnosis present

## 2021-04-24 DIAGNOSIS — M545 Low back pain, unspecified: Secondary | ICD-10-CM | POA: Diagnosis present

## 2021-04-24 NOTE — Therapy (Signed)
[facility] [address]. [place], [place], [address] Phone: [phone]   Fax:  [phone]  Physical Therapy Treatment  Patient Details  Name: [place] Bean MRN: [id number] Date of Birth: January 09, 1956 Referring Provider (PT): Dr. Vanita Ingles   Encounter Date: 04/24/2021   PT End of Session - 04/24/21 0906    Visit Number 21    Number of Visits 33    Date for PT Re-Evaluation 05/06/21    Authorization Type eval: 01/15/21, PN(and recert): 03/11/21; FOTO predicted visit count 14    PT Start Time 0845    PT Stop Time 0930    PT Time Calculation (min) 45 min    Activity Tolerance Patient tolerated treatment well    Behavior During Therapy [facility] for tasks assessed/performed           Past Medical History:  Diagnosis Date  . BRCA negative 01/2017   MyRisk neg  . Family history of breast cancer    1/18 IBIS=10.7%  . Family history of ovarian cancer    MyRisk neg  . Genital herpes   . Hyperlipidemia   . Hypertension   . IBS (irritable bowel syndrome) 02/20/2017  . Sarcoidosis     Past Surgical History:  Procedure Laterality Date  . BACK SURGERY    . BUNIONECTOMY Left   . COLONOSCOPY  2012   cleared for 10 yrs  . COLONOSCOPY WITH PROPOFOL N/A 02/27/2017   Procedure: COLONOSCOPY WITH PROPOFOL;  Surgeon: Christena Deem, MD;  Location: [facility] ENDOSCOPY;  Service: Endoscopy;  Laterality: N/A;  . VAGINAL HYSTERECTOMY      There were no vitals filed for this visit.   Subjective Assessment - 04/24/21 0903    Subjective Patient reports that she is doing alright today. She denies any pain in L foot/ankle upon arrival but toes continue to cause issues as well as back. No specific questions currently.    Pertinent History Pt reports that approximately 1 year ago she started having L foot pain. She was wearing a lot of flats around that time when the pain started started. The pain improved after approximately 1 week without any  intervention. She was visiting her dad in October 2021 and her foot pain flared up significantly again. She was unable ot put weight on her foot due to the pain and had to use crutches to help her walk. She went to the urgent care and was sent to general orthopedics who recommended orthotics and gave her two steroid shots on her medial foot near her L posterior tibial tendon. She reports improvement for 1-2 days and then the pain returned. After returning to [place] she saw general orthopedics who placed a medial heel wedge in her shoes. He also prescribed her Voltaren gel for topical anti-inflammatory use to be used up to 4 times/day. 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 The pain is located on the medial aspect of her L foot at the insertion on the medial navicular and she describes it as "achy." No other recent changes in medication or health. History of chronic back pain with LLE radicular pain to the calf. Pt reports she has had back surgery without improvement but does not recall what type of surgery she had. Pt has a history of sarcoidosis but states that she had a breathing test yesterday which was good.    Limitations Walking;Standing    How long can you stand comfortably? 5 minutes  Diagnostic tests MRI    Patient Stated Goals Improve foot pain so she can walk comfortably    Currently in Pain? No/denies             TREATMENT   Ther-ex NuStep L2 x 5 minutes for warm-up during history (unbilled); Standing Prostretch calf stretch 45s x 3; BOSU (round side up) step-ups without UE support 3 x 10; BOSU (round side up) L single leg balance without UE support 3 x 30s; L single leg heel raises on step x 20, x 10; Airex L single leg balance with ball tosses to therapist 30s x 2; BOSU (flat side up) squats x 10; L ankle 4 ways with manual resistance in longsitting 2 x 20 for each direction;   Manual Therapy Transverse friction massage applied to tibialis  posterior tendon posterior to medial malleolusin longsitting to improve tissue extensibility x 5 minutes, no visible signs of skin irritationafterward.   Electrical Stimulation, unbilled TENS applied to patient's lower lumbar spine bilaterally throughout session to help her with pain control. Utilizedlarge muscle default TENSsetting on Empi Continuum unitwith small pads at pt tolerated intensity of 7.0 initially but increased to 9.0 bilaterally halfway through time. Total time 30 minutes during exercise. All time unbilled;  Pt educated throughout session about proper posture and technique with exercises. Improved exercise technique, movement at target joints, use of target muscles after min to mod verbal, visual, tactile cues.   Patient making excellent progress towards her goals with decrease in pain and improved ability to walk longer distances with less pain. She denies any pain in L foot/ankle upon arrival and during the session. She is able to progress since leg stability activities today to include BOSU step-ups and L single leg balance on Airex with ball tosses. She denies any pain to palpation today over her posterior tibial tendon. Will continue to progress strengthening for long term management of her pain. Pt encouraged to continue HEP. She will benefit from PT services to address deficits in L foot pain in order to return to full pain-free function at home.[place] Bean benefit from PT services to address deficits in L foot/ankle pain in order to return to full pain-free function at home.                            PT Short Term Goals - 04/17/21 1414      PT SHORT TERM GOAL #1   Title Pt will be independent with HEP in order to decrease foot pain and increase strength/ROM in order to improve pain-free function at home.    Time 4    Period Weeks    Status On-going    Target Date 04/08/21             PT Long Term Goals - 04/17/21 1414      PT  LONG TERM GOAL #1   Title Pt will decrease worst pain as reported on NPRS by at least 3 points in order to demonstrate clinically significant reduction in foot pain.    Baseline 01/15/21: worst: 10/10, 03/11/21: 8/10; 04/15/21: 4/10;    Time 8    Period Weeks    Status Achieved      PT LONG TERM GOAL #2   Title Pt will increase LEFS by at least 9 points in order to demonstrate significant improvement in lower extremity function.    Baseline 01/15/21: to be completed at next visit; 03/11/21: 21/80; 04/15/21: 23/80  Time 8    Period Weeks    Status Partially Met    Target Date 05/06/21      PT LONG TERM GOAL #3   Title Pt will improve painless L ankle active eversion to within 2 degrees of R side in order to demonstrate improved ankle mobility with less pain    Baseline 01/15/21: R/L 18/12 Ankle Eversion (L ankle painful); 03/11/21: Deferred to next session; 04/15/21: Deferred    Time 8    Period Weeks    Status Deferred    Target Date 05/06/21      PT LONG TERM GOAL #4   Title Pt will improve FOTO to at least 50 in order to demonstrate significant improvement in L ankle function    Baseline 01/15/21: 26; 03/11/21: 29; 04/15/21: 36;    Time 8    Period Weeks    Status Partially Met    Target Date 05/06/21                 Plan - 04/24/21 0907    Clinical Impression Statement Patient making excellent progress towards her goals with decrease in pain and improved ability to walk longer distances with less pain. She denies any pain in L foot/ankle upon arrival and during the session. She is able to progress since leg stability activities today to include BOSU step-ups and L single leg balance on Airex with ball tosses. She denies any pain to palpation today over her posterior tibial tendon. Will continue to progress strengthening for long term management of her pain. Pt encouraged to continue HEP. She will benefit from PT services to address deficits in L foot pain in order to return to full  pain-free function at home.Marland Kitchen She will benefit from PT services to address deficits in L foot/ankle pain in order to return to full pain-free function at home.    Personal Factors and Comorbidities Time since onset of injury/illness/exacerbation;Past/Current Experience;Comorbidity 3+    Comorbidities HTN, hyperlipidemia, scarcoidosis, IBS    Examination-Activity Limitations Squat;Stand    Examination-Participation Restrictions Community Activity;Cleaning;Meal Prep    Stability/Clinical Decision Making Evolving/Moderate complexity    Rehab Potential Good    PT Frequency 2x / week    PT Duration 8 weeks    PT Treatment/Interventions Canalith Repostioning;Gait training;Stair training;Therapeutic activities;Balance training;Therapeutic exercise;Neuromuscular re-education;Patient/family education;Vestibular;Functional mobility training;ADLs/Self Care Home Management;Aquatic Therapy;Biofeedback;Electrical Stimulation;Cryotherapy;Iontophoresis 47m/ml Dexamethasone;Moist Heat;Ultrasound;Traction;Manual techniques;Passive range of motion;Dry needling;Taping;Joint Manipulations;Other (comment)   phonophoresis   PT Next Visit Plan continue exercises/stretches    PT Home Exercise Plan Access Code: [email or web address]   Consulted and Agree with Plan of Care Patient           Patient will benefit from skilled therapeutic intervention in order to improve the following deficits and impairments:  Pain,Abnormal gait,Difficulty walking  Visit Diagnosis: Pain in left foot     Problem List Patient Active Problem List   Diagnosis Date Noted  . Pure hypercholesterolemia 11/04/2016  . Essential hypertension 09/18/2015  . Hyperlipidemia 09/18/2015  . Recurrent genital herpes simplex 09/18/2015   JLyndel SafeHuprich PT, DPT, GCS  [place] 04/24/2021, 9:53 AM  [place] [facility] [place] [address]Phone: [phone]  Fax:  [phone] Name: ACarsynn BethuneMRN: [id number] of Birth: [birth year]

## 2021-04-27 ENCOUNTER — Encounter: Payer: Self-pay | Admitting: Intensive Care

## 2021-04-27 ENCOUNTER — Emergency Department
Admission: EM | Admit: 2021-04-27 | Discharge: 2021-04-27 | Disposition: A | Discharge status: Home / Self Care | Payer: No Typology Code available for payment source | Attending: Emergency Medicine | Admitting: Emergency Medicine

## 2021-04-27 ENCOUNTER — Other Ambulatory Visit: Payer: Self-pay

## 2021-04-27 ENCOUNTER — Emergency Department: Payer: No Typology Code available for payment source

## 2021-04-27 DIAGNOSIS — I10 Essential (primary) hypertension: Secondary | ICD-10-CM | POA: Insufficient documentation

## 2021-04-27 DIAGNOSIS — L0211 Cutaneous abscess of neck: Secondary | ICD-10-CM | POA: Diagnosis not present

## 2021-04-27 DIAGNOSIS — Z79899 Other long term (current) drug therapy: Secondary | ICD-10-CM | POA: Insufficient documentation

## 2021-04-27 DIAGNOSIS — Y92410 Unspecified street and highway as the place of occurrence of the external cause: Secondary | ICD-10-CM | POA: Insufficient documentation

## 2021-04-27 DIAGNOSIS — R0781 Pleurodynia: Secondary | ICD-10-CM | POA: Diagnosis not present

## 2021-04-27 DIAGNOSIS — M546 Pain in thoracic spine: Secondary | ICD-10-CM | POA: Insufficient documentation

## 2021-04-27 DIAGNOSIS — M542 Cervicalgia: Secondary | ICD-10-CM | POA: Diagnosis present

## 2021-04-27 DIAGNOSIS — H70099 Acute mastoiditis with other complications, unspecified ear: Secondary | ICD-10-CM | POA: Insufficient documentation

## 2021-04-27 MED ORDER — IBUPROFEN 600 MG PO TABS: 600.0000 mg | ORAL_TABLET | Freq: Three times a day (TID) | ORAL | 0 refills | Status: DC | PRN

## 2021-04-27 MED ORDER — CYCLOBENZAPRINE HCL 5 MG PO TABS: 5.0000 mg | ORAL_TABLET | Freq: Three times a day (TID) | ORAL | 0 refills | Status: DC | PRN

## 2021-04-27 MED ORDER — IBUPROFEN 800 MG PO TABS
800.0000 mg | ORAL_TABLET | Freq: Once | ORAL | Status: AC
  Administered 2021-04-27: 800 mg via ORAL
  Filled 2021-04-27: qty 1

## 2021-04-27 MED ORDER — CYCLOBENZAPRINE HCL 10 MG PO TABS
5.0000 mg | ORAL_TABLET | Freq: Once | ORAL | Status: AC
  Administered 2021-04-27: 5 mg via ORAL
  Filled 2021-04-27: qty 1
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
## 2021-04-29 ENCOUNTER — Ambulatory Visit: Payer: Medicaid Other | Admitting: Family Medicine

## 2021-04-30 ENCOUNTER — Other Ambulatory Visit: Payer: Self-pay

## 2021-04-30 ENCOUNTER — Emergency Department
Admission: EM | Admit: 2021-04-30 | Discharge: 2021-04-30 | Disposition: A | Discharge status: Home / Self Care | Payer: Medicaid Other | Attending: Emergency Medicine | Admitting: Emergency Medicine

## 2021-04-30 DIAGNOSIS — M542 Cervicalgia: Secondary | ICD-10-CM | POA: Insufficient documentation

## 2021-04-30 DIAGNOSIS — M7918 Myalgia, other site: Secondary | ICD-10-CM

## 2021-04-30 DIAGNOSIS — M549 Dorsalgia, unspecified: Secondary | ICD-10-CM | POA: Diagnosis not present

## 2021-04-30 DIAGNOSIS — I10 Essential (primary) hypertension: Secondary | ICD-10-CM | POA: Diagnosis not present

## 2021-04-30 DIAGNOSIS — Z79899 Other long term (current) drug therapy: Secondary | ICD-10-CM | POA: Insufficient documentation

## 2021-04-30 NOTE — Discharge Instructions (Signed)
Call physical therapist listed in your discharge care instructions to schedule appointment.
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
## 2021-04-30 NOTE — ED Notes (Signed)
Pt reports taking BP medication 45 mins ago

## 2021-04-30 NOTE — Patient Instructions (Incomplete)
TREATMENT   Ther-ex NuStep L2 x 5 minutes for warm-up during history (unbilled); Standing Prostretch calf stretch 45s x 3; BOSU (round side up) step-ups without UE support 3 x 10; BOSU (round side up) L single leg balance without UE support 3 x 30s; L single leg heel raises on step x 20, x 10; Airex L single leg balance with ball tosses to therapist 30s x 2; BOSU (flat side up) squats x 10; L ankle 4 ways with manual resistance in longsitting 2 x 20 for each direction;   Manual Therapy Transverse friction massage applied to tibialis posterior tendon posterior to medial malleolusin longsitting to improve tissue extensibility x 5 minutes, no visible signs of skin irritationafterward.   Electrical Stimulation, unbilled TENS applied to patient's lower lumbar spine bilaterally throughout session to help her with pain control. Utilizedlarge muscle default TENSsetting on Empi Continuum unitwith small pads at pt tolerated intensity of 7.0 initially but increased to 9.0 bilaterally halfway through time. Total time 30 minutes during exercise. All time unbilled;  Pt educated throughout session about proper posture and technique with exercises. Improved exercise technique, movement at target joints, use of target muscles after min to mod verbal, visual, tactile cues.   Patient making excellent progress towards her goals with decrease in pain and improved ability to walk longer distances with less pain. She denies any pain in L foot/ankle upon arrival and during the session. She is able to progress since leg stability activities today to include BOSU step-ups and L single leg balance on Airex with ball tosses. She denies any pain to palpation today over her posterior tibial tendon. Will continue to progress strengthening for long term management of her pain. Pt encouraged to continue HEP. She will benefit from PT services to address deficits in L foot pain in order to return to full  pain-free function at home.[place] Bean benefit from PT services to address deficits in L foot/ankle pain in order to return to full pain-free function at home.
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
## 2021-05-01 ENCOUNTER — Ambulatory Visit: Payer: Medicaid Other

## 2021-05-01 DIAGNOSIS — M79672 Pain in left foot: Secondary | ICD-10-CM

## 2021-05-08 ENCOUNTER — Ambulatory Visit: Payer: Medicaid Other

## 2021-05-08 ENCOUNTER — Other Ambulatory Visit: Payer: Self-pay

## 2021-05-08 DIAGNOSIS — M545 Low back pain, unspecified: Secondary | ICD-10-CM

## 2021-05-08 DIAGNOSIS — M79672 Pain in left foot: Secondary | ICD-10-CM | POA: Diagnosis not present

## 2021-05-08 NOTE — Patient Instructions (Signed)
Access Code: [email or web address] URL: [email or web address] Date: 05/08/2021 Prepared by: Ria Comment  Exercises Supine Piriformis Stretch with Foot on Ground - 2 x daily - 7 x weekly - 3 reps - 45s hold Supine Lumbar Rotation Stretch - 2 x daily - 7 x weekly - 3 reps - 45s hold Hooklying Single Knee to Chest Stretch - 2 x daily - 7 x weekly - 3 reps - 45s hold Supine Pelvic Tilt - 2 x daily - 7 x weekly - 2 sets - 10 reps - 5s hold
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
## 2021-05-22 ENCOUNTER — Ambulatory Visit: Payer: Medicaid Other | Attending: Physician Assistant

## 2021-05-22 ENCOUNTER — Other Ambulatory Visit: Payer: Self-pay

## 2021-05-22 DIAGNOSIS — G8929 Other chronic pain: Secondary | ICD-10-CM | POA: Insufficient documentation

## 2021-05-22 DIAGNOSIS — M545 Low back pain, unspecified: Secondary | ICD-10-CM | POA: Insufficient documentation

## 2021-06-05 ENCOUNTER — Ambulatory Visit: Payer: Medicaid Other

## 2021-06-05 ENCOUNTER — Other Ambulatory Visit: Payer: Self-pay

## 2021-06-05 DIAGNOSIS — M545 Low back pain, unspecified: Secondary | ICD-10-CM

## 2021-06-05 DIAGNOSIS — G8929 Other chronic pain: Secondary | ICD-10-CM | POA: Diagnosis present

## 2021-06-05 NOTE — Patient Instructions (Addendum)
Access Code: [email or web address] URL: [email or web address] Date: 06/05/2021 Prepared by: Ria Comment  Exercises Supine Piriformis Stretch with Foot on Ground - 2 x daily - 7 x weekly - 3 reps - 45s hold Supine Lumbar Rotation Stretch - 2 x daily - 7 x weekly - 3 reps - 45s hold Hooklying Single Knee to Chest Stretch - 2 x daily - 7 x weekly - 3 reps - 45s hold Supine Posterior Pelvic Tilt - 2 x daily - 7 x weekly - 2 sets - 10 reps - 3s hold Supine March with Posterior Pelvic Tilt - 2 x daily - 7 x weekly - 2 sets - 10 reps - 3s hold
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
## 2021-06-12 ENCOUNTER — Other Ambulatory Visit: Payer: Self-pay

## 2021-06-12 ENCOUNTER — Ambulatory Visit: Payer: Medicaid Other

## 2021-06-12 DIAGNOSIS — M545 Low back pain, unspecified: Secondary | ICD-10-CM
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
## 2021-06-12 NOTE — Patient Instructions (Signed)
Access Code: [email or web address] URL: [email or web address] Date: 06/12/2021 Prepared by: Ria Comment  Exercises Supine Piriformis Stretch with Foot on Ground - 2 x daily - 7 x weekly - 3 reps - 45s hold Supine Lumbar Rotation Stretch - 2 x daily - 7 x weekly - 3 reps - 45s hold Hooklying Single Knee to Chest Stretch - 2 x daily - 7 x weekly - 3 reps - 45s hold Supine Posterior Pelvic Tilt - 2 x daily - 7 x weekly - 2 sets - 10 reps - 3s hold Supine Active Straight Leg Raise - 2 x daily - 7 x weekly - 2 sets - 10 reps - 3s hold Supine Bridge - 2 x daily - 7 x weekly - 2 sets - 10 reps - 3s hold

## 2021-06-17 ENCOUNTER — Other Ambulatory Visit: Payer: Self-pay | Admitting: Family Medicine

## 2021-06-17 ENCOUNTER — Other Ambulatory Visit: Payer: Self-pay | Admitting: Obstetrics and Gynecology

## 2021-06-17 DIAGNOSIS — Z1231 Encounter for screening mammogram for malignant neoplasm of breast: Secondary | ICD-10-CM

## 2021-06-26 ENCOUNTER — Ambulatory Visit: Payer: Medicaid Other

## 2021-07-01 ENCOUNTER — Ambulatory Visit: Payer: Medicaid Other

## 2021-07-08 ENCOUNTER — Ambulatory Visit: Payer: Medicaid Other | Attending: Physician Assistant

## 2021-07-08 ENCOUNTER — Other Ambulatory Visit: Payer: Self-pay

## 2021-07-08 DIAGNOSIS — M79672 Pain in left foot: Secondary | ICD-10-CM | POA: Insufficient documentation

## 2021-07-08 DIAGNOSIS — M545 Low back pain, unspecified: Secondary | ICD-10-CM | POA: Insufficient documentation
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
## 2021-07-15 ENCOUNTER — Other Ambulatory Visit: Payer: Self-pay

## 2021-07-15 ENCOUNTER — Ambulatory Visit
Admission: RE | Admit: 2021-07-15 | Discharge: 2021-07-15 | Disposition: A | Discharge status: Home / Self Care | Payer: Medicaid Other | Source: Ambulatory Visit | Attending: Family Medicine | Admitting: Family Medicine

## 2021-07-15 DIAGNOSIS — Z1231 Encounter for screening mammogram for malignant neoplasm of breast: Secondary | ICD-10-CM | POA: Diagnosis not present

## 2021-07-19 ENCOUNTER — Ambulatory Visit: Payer: Medicaid Other

## 2021-07-24 ENCOUNTER — Ambulatory Visit: Payer: Medicaid Other

## 2021-07-29 ENCOUNTER — Other Ambulatory Visit: Payer: Self-pay

## 2021-07-29 ENCOUNTER — Other Ambulatory Visit: Payer: Self-pay | Admitting: Family Medicine

## 2021-07-29 ENCOUNTER — Ambulatory Visit: Payer: Medicaid Other | Attending: Physician Assistant

## 2021-07-29 DIAGNOSIS — M79672 Pain in left foot: Secondary | ICD-10-CM | POA: Insufficient documentation

## 2021-07-29 DIAGNOSIS — E7800 Pure hypercholesterolemia, unspecified: Secondary | ICD-10-CM

## 2021-07-29 DIAGNOSIS — A6000 Herpesviral infection of urogenital system, unspecified: Secondary | ICD-10-CM
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
## 2021-07-29 NOTE — Patient Instructions (Signed)
Access Code: [email or web address] URL: [email or web address] Date: 07/29/2021 Prepared by: Ria Comment  Exercises Seated Arch Lifts - 2 x daily - 7 x weekly - 2 sets - 10 reps - 5s hold Isometric Ankle Inversion at Wall - 2 x daily - 7 x weekly - 2 sets - 5 reps - 10s hold Seated Ankle Inversion Eversion PROM - 2 x daily - 7 x weekly - 3 reps - 45s hold Seated Heel Raise - 2 x daily - 7 x weekly - 1 sets - 20 reps Standing Gastroc Stretch on Step with Counter Support - 2 x daily - 7 x weekly - 3 reps - 45s hold  Patient Education Ice Massage

## 2021-07-29 NOTE — Telephone Encounter (Signed)
Requested medication (s) are due for refill today:  no  Requested medication (s) are on the active medication list: yes   Last refill:  03/05/2021  Future visit scheduled: no  Notes to clinic: Failed protocol:  Total Cholesterol in normal range and within 360 days   LDL in normal range and within 360 days   HDL in normal range and within 360 days   Triglycerides in normal range and within 360 days      Requested Prescriptions  Pending Prescriptions Disp Refills   pravastatin (PRAVACHOL) 20 MG tablet [Pharmacy Med Name: Pravastatin Sodium 20 MG Oral Tablet] 90 tablet 0    Sig: Take 1 tablet by mouth once daily      Cardiovascular:  Antilipid - Statins Failed - 07/29/2021  9:21 AM      Failed - Total Cholesterol in normal range and within 360 days    Cholesterol, Total  Date Value Ref Range Status  04/03/2020 170 100 - 199 mg/dL Final          Failed - LDL in normal range and within 360 days    LDL Chol Calc (NIH)  Date Value Ref Range Status  04/03/2020 112 (H) 0 - 99 mg/dL Final          Failed - HDL in normal range and within 360 days    HDL  Date Value Ref Range Status  04/03/2020 45 >39 mg/dL Final          Failed - Triglycerides in normal range and within 360 days    Triglycerides  Date Value Ref Range Status  04/03/2020 65 0 - 149 mg/dL Final          Passed - Patient is not pregnant      Passed - Valid encounter within last 12 months    Recent Outpatient Visits           3 months ago Left forearm pain   [facility] Duanne Limerick, MD   4 months ago Acne vulgaris   [facility] Duanne Limerick, MD   8 months ago Gastrocnemius strain, right, initial encounter   [facility] Duanne Limerick, MD   1 year ago Essential hypertension   [facility] Duanne Limerick, MD   1 year ago Essential hypertension   [facility] Duanne Limerick, MD                  valACYclovir (VALTREX) 500 MG tablet  [Pharmacy Med Name: valACYclovir HCl 500 MG Oral Tablet] 90 tablet 0    Sig: TAKE 1 TABLET BY MOUTH ONCE DAILY. NEED APPT FOR FURTHER REFILLS      Antimicrobials:  Antiviral Agents - Anti-Herpetic Passed - 07/29/2021  9:21 AM      Passed - Valid encounter within last 12 months    Recent Outpatient Visits           3 months ago Left forearm pain   [facility] Duanne Limerick, MD   4 months ago Acne vulgaris   [facility] Duanne Limerick, MD   8 months ago Gastrocnemius strain, right, initial encounter   [facility] Duanne Limerick, MD   1 year ago Essential hypertension   [facility] Duanne Limerick, MD   1 year ago Essential hypertension   [facility] Duanne Limerick, MD

## 2021-08-05 ENCOUNTER — Ambulatory Visit: Payer: Medicaid Other

## 2021-08-12 ENCOUNTER — Ambulatory Visit: Payer: Medicaid Other

## 2021-08-12 ENCOUNTER — Other Ambulatory Visit: Payer: Self-pay

## 2021-08-12 DIAGNOSIS — M79672 Pain in left foot: Secondary | ICD-10-CM | POA: Diagnosis not present

## 2021-08-12 NOTE — Therapy (Signed)
[facility] [address] [place], [place], [address] Phone: [phone]   Fax:  [phone]  Physical Therapy Treatment  Patient Details  Name: Cheyenne Bean MRN: [id number] Date of Birth: [birth year] Referring Provider (PT): Dr. Gardiner Fanti   Encounter Date: 08/12/2021   PT End of Session - 08/12/21 1010     Visit Number 2    Number of Visits 9    Date for PT Re-Evaluation 09/23/21    Authorization Type eval: 07/29/21,    Authorization Time Period Medicaid approved 3 visits 08/05/21-09/01/21    Authorization - Visit Number 1    Authorization - Number of Visits 3    PT Start Time 7494    PT Stop Time 1100    PT Time Calculation (min) 45 min    Activity Tolerance Patient tolerated treatment well    Behavior During Therapy [facility] for tasks assessed/performed             Past Medical History:  Diagnosis Date   BRCA negative 01/2017   MyRisk neg   Family history of breast cancer    1/18 IBIS=10.7%   Family history of ovarian cancer    MyRisk neg   Genital herpes    Hyperlipidemia    Hypertension    IBS (irritable bowel syndrome) 02/20/2017   Sarcoidosis     Past Surgical History:  Procedure Laterality Date   BACK SURGERY     BUNIONECTOMY Left    COLONOSCOPY  2012   cleared for 10 yrs   COLONOSCOPY WITH PROPOFOL N/A 02/27/2017   Procedure: COLONOSCOPY WITH PROPOFOL;  Surgeon: Lollie Sails, MD;  Location: [facility] ENDOSCOPY;  Service: Endoscopy;  Laterality: N/A;   VAGINAL HYSTERECTOMY      There were no vitals filed for this visit.   Subjective Assessment - 08/12/21 1009     Subjective Pt arrives reporting worsening L foot pain. Rates her pain as 6/10 upon arrival. She states that the pain is now reaching under the arch of her foot. No specific questions currently.    Pertinent History Pt reports that approximately 1 year ago she started having L foot pain. She was wearing a lot of flats around that time when  the pain started started. The pain improved after approximately 1 week without any intervention. She was visiting her dad in October 2021 and her foot pain flared up significantly again. She was unable ot put weight on her foot due to the pain and had to use crutches to help her walk. She went to the urgent care and was sent to general orthopedics who recommended orthotics and gave her two steroid shots on her medial foot near her L posterior tibial tendon. She reports improvement for 1-2 days and then the pain returned. After returning to [place] she saw general orthopedics who placed a medial heel wedge in her shoes. He also prescribed her Voltaren gel for topical anti-inflammatory use to be used up to 4 times/day. 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 The pain is located on the medial aspect of her L foot at the insertion on the medial navicular and she describes it as "achy." No other recent changes in medication or health. History of chronic back pain with LLE radicular pain to the calf. Pt reports she has had back surgery without improvement but does not recall what type of surgery she had. Pt has a history of sarcoidosis but states that she had  a breathing test yesterday which was good. 07/29/21: Worsening pain in L medial foot at the same location as previously, saw podiatrist who wrote an order for custom orthotics and encouraged her to continue with therapy. Plain film radiographs repeated.    Limitations Walking;Standing    How long can you stand comfortably? 1 hour    Diagnostic tests Plain film radiographs: see history    Patient Stated Goals Decrease foot pain                  TREATMENT   Ther-ex   NuStep L2 x 5 minutes for warm-up during history (4 minutes unbilled); Standing gastroc stretch on step 45-second hold x3; Resisted L ankle dorsiflexion, plantarflexion, and eversion 3 x 10 each; Eccentric L ankle inversion strengthening 5-8s contraction 3 x  10;    Manual Therapy  L talocrural A/P mobilizations at available end range dorsiflexion, grade III, 30s/bout x 3 bouts; Transverse friction massage applied to tibialis posterior tendon posterior to insertion and moving proximally along length of tendon into lower leg to improve tissue extensibility and for pain modulation. No visible signs of skin irritation afterward. STM to plantar fascia with occasional instrument assist;     Pt educated throughout session about proper posture and technique with exercises. Improved exercise technique, movement at target joints, use of target muscles after min to mod verbal, visual, tactile cues.     Pt reports continued discomfort/pain in L foot.  Initiated gastroc stretches, manual techniques, and eccentric tibialis posterior strengthening .  Also initiated left ankle dorsiflexion, plantarflexion, and eversion strengthening.  Patient encouraged to continue HEP.  We will continue to progress manual techniques and strengthening at follow-up visit.  She will benefit from PT services to address deficits in L foot/ankle pain in order to return to full pain-free function at home.                           PT Short Term Goals - 07/29/21 1329       PT SHORT TERM GOAL #1   Title Pt will be independent with HEP in order to decrease foot pain and increase strength/ROM in order to improve pain-free function at home.    Time 4    Period Weeks    Status New    Target Date 08/26/21               PT Long Term Goals - 07/29/21 1330       PT LONG TERM GOAL #1   Title Pt will decrease worst pain as reported on NPRS by at least 3 points in order to demonstrate clinically significant reduction in foot pain.    Baseline 07/29/21: 6/10    Time 8    Period Weeks    Status New    Target Date 09/23/21      PT LONG TERM GOAL #2   Title Pt will increase LEFS by at least 9 points in order to demonstrate significant improvement in lower  extremity function.    Baseline 07/29/21: 26/80    Time 8    Period Weeks    Status New    Target Date 09/23/21      PT LONG TERM GOAL #3   Title Pt will improve painless L ankle active and passive eversion to at least 14 degrees in order to demonstrate improved ankle mobility with less pain    Baseline 07/29/21: eversion: 10 degrees with pain  Time 8    Period Weeks    Status New    Target Date 09/23/21      PT LONG TERM GOAL #4   Title Pt will improve FOTO to at least 52 in order to demonstrate significant improvement in L ankle function    Baseline 07/29/21: 33    Time 8    Period Weeks    Status New    Target Date 09/23/21                   Plan - 08/12/21 1010     Clinical Impression Statement Pt reports continued discomfort/pain in L foot.  Initiated gastroc stretches, manual techniques, and eccentric tibialis posterior strengthening .  Also initiated left ankle dorsiflexion, plantarflexion, and eversion strengthening.  Patient encouraged to continue HEP.  We will continue to progress manual techniques and strengthening at follow-up visit.  She will benefit from PT services to address deficits in L foot/ankle pain in order to return to full pain-free function at home.    Personal Factors and Comorbidities Past/Current Experience;Comorbidity 3+;Time since onset of injury/illness/exacerbation    Comorbidities HTN, hyperlipidemia, scarcoidosis, IBS, previous back surgery    Examination-Activity Limitations Stand    Examination-Participation Restrictions Community Activity;Cleaning    Stability/Clinical Decision Making Evolving/Moderate complexity    Rehab Potential Good    PT Frequency 1x / week    PT Duration 8 weeks   1 week   PT Treatment/Interventions Canalith Repostioning;Gait training;Stair training;Therapeutic activities;Balance training;Therapeutic exercise;Neuromuscular re-education;Patient/family education;Vestibular;Functional mobility training;ADLs/Self Care  Home Management;Aquatic Therapy;Biofeedback;Electrical Stimulation;Cryotherapy;Iontophoresis 36m/ml Dexamethasone;Moist Heat;Ultrasound;Traction;Manual techniques;Passive range of motion;Dry needling;Taping;Joint Manipulations;Other (comment);Spinal Manipulations   phonophoresis   PT Next Visit Plan Initiate manual techniques, light stretching, and strengthening    PT Home Exercise Plan Access Code: [email or web address]   Consulted and Agree with Plan of Care Patient             Patient will benefit from skilled therapeutic intervention in order to improve the following deficits and impairments:  Pain, Difficulty walking  Visit Diagnosis: Pain in left foot     Problem List Patient Active Problem List   Diagnosis Date Noted   Pure hypercholesterolemia 11/04/2016   Essential hypertension 09/18/2015   Hyperlipidemia 09/18/2015   Recurrent genital herpes simplex 09/18/2015    JLyndel SafeHuprich PT, DPT, GCS  Huprich,Jason 08/12/2021, 1:11 PM  [place] [facility] [place] [address]Phone: [phone]  Fax:  [phone] Name: ARonneisha JettMRN: [id number] of Birth: [birth year]

## 2021-08-19 ENCOUNTER — Ambulatory Visit: Payer: Medicaid Other

## 2021-08-19 ENCOUNTER — Other Ambulatory Visit: Payer: Self-pay

## 2021-08-19 DIAGNOSIS — M79672 Pain in left foot: Secondary | ICD-10-CM

## 2021-08-19 NOTE — Therapy (Signed)
[facility] [address] [place], [place], [address] Phone: [phone]   Fax:  [phone]  Physical Therapy Treatment  Patient Details  Name: Cheyenne Bean MRN: [id number] Date of Birth: 04-26-1956 Referring Provider (PT): Dr. Gardiner Fanti   Encounter Date: 08/19/2021   PT End of Session - 08/19/21 1023     Visit Number 3    Number of Visits 9    Date for PT Re-Evaluation 09/23/21    Authorization Type eval: 07/29/21,    Authorization Time Period Medicaid approved 3 visits 08/05/21-09/01/21    Authorization - Visit Number 2    Authorization - Number of Visits 3    PT Start Time 2567    PT Stop Time 1100    PT Time Calculation (min) 42 min    Activity Tolerance Patient tolerated treatment well    Behavior During Therapy [facility] for tasks assessed/performed             Past Medical History:  Diagnosis Date   BRCA negative 01/2017   MyRisk neg   Family history of breast cancer    1/18 IBIS=10.7%   Family history of ovarian cancer    MyRisk neg   Genital herpes    Hyperlipidemia    Hypertension    IBS (irritable bowel syndrome) 02/20/2017   Sarcoidosis     Past Surgical History:  Procedure Laterality Date   BACK SURGERY     BUNIONECTOMY Left    COLONOSCOPY  2012   cleared for 10 yrs   COLONOSCOPY WITH PROPOFOL N/A 02/27/2017   Procedure: COLONOSCOPY WITH PROPOFOL;  Surgeon: Lollie Sails, MD;  Location: [facility] ENDOSCOPY;  Service: Endoscopy;  Laterality: N/A;   VAGINAL HYSTERECTOMY      There were no vitals filed for this visit.   Subjective Assessment - 08/19/21 1022     Subjective Pt arrives reporting 3/10 L foot pain upon arrival. She has not been very active this morning. No specific questions currently.    Pertinent History Pt reports that approximately 1 year ago she started having L foot pain. She was wearing a lot of flats around that time when the pain started started. The pain improved after  approximately 1 week without any intervention. She was visiting her dad in October 2021 and her foot pain flared up significantly again. She was unable ot put weight on her foot due to the pain and had to use crutches to help her walk. She went to the urgent care and was sent to general orthopedics who recommended orthotics and gave her two steroid shots on her medial foot near her L posterior tibial tendon. She reports improvement for 1-2 days and then the pain returned. After returning to [place] she saw general orthopedics who placed a medial heel wedge in her shoes. He also prescribed her Voltaren gel for topical anti-inflammatory use to be used up to 4 times/day. 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 The pain is located on the medial aspect of her L foot at the insertion on the medial navicular and she describes it as "achy." No other recent changes in medication or health. History of chronic back pain with LLE radicular pain to the calf. Pt reports she has had back surgery without improvement but does not recall what type of surgery she had. Pt has a history of sarcoidosis but states that she had a breathing test yesterday which was good. 07/29/21: Worsening pain in  L medial foot at the same location as previously, saw podiatrist who wrote an order for custom orthotics and encouraged her to continue with therapy. Plain film radiographs repeated.    Limitations Walking;Standing    How long can you stand comfortably? 1 hour    Diagnostic tests Plain film radiographs: see history    Patient Stated Goals Decrease foot pain               TREATMENT   Ther-ex   NuStep L2 x 5 minutes for warm-up during history (3 minutes unbilled); Standing gastroc stretch on step 60-second hold x 3; Standing heel raises on 6" step 3 x 10; Resisted L ankle dorsiflexion, plantarflexion, and eversion 3 x 10 each; Manually resisted eccentric L ankle inversion strengthening, pt reports mild pain  so regressed to isometrics; Manually resisted isometric L ankle inversion strengthening, 20s on/20s off x 5;    Manual Therapy  L talocrural A/P mobilizations at available end range dorsiflexion, grade III, 30s/bout x 3 bouts; Transverse friction massage applied to tibialis posterior tendon posterior to insertion and moving proximally along length of tendon into lower leg to improve tissue extensibility and for pain modulation. No visible signs of skin irritation afterward. Discussed toe spreaders to help with neuroma;     Pt educated throughout session about proper posture and technique with exercises. Improved exercise technique, movement at target joints, use of target muscles after min to mod verbal, visual, tactile cues.     Pt reports continued discomfort/pain in L foot. Continued gastrocnemius stretches, manual techniques, and tibialis posterior strengthening. Pt had to regress to isometric strengthening due to increase in pain during attempts at eccentric strengthening.  Also continued left ankle dorsiflexion, plantarflexion, and eversion strengthening.  Patient encouraged to continue HEP.  We will continue to progress manual techniques and strengthening at follow-up visit.  She will benefit from PT services to address deficits in L foot/ankle pain in order to return to full pain-free function at home.                           PT Short Term Goals - 07/29/21 1329       PT SHORT TERM GOAL #1   Title Pt will be independent with HEP in order to decrease foot pain and increase strength/ROM in order to improve pain-free function at home.    Time 4    Period Weeks    Status New    Target Date 08/26/21               PT Long Term Goals - 07/29/21 1330       PT LONG TERM GOAL #1   Title Pt will decrease worst pain as reported on NPRS by at least 3 points in order to demonstrate clinically significant reduction in foot pain.    Baseline 07/29/21: 6/10    Time 8     Period Weeks    Status New    Target Date 09/23/21      PT LONG TERM GOAL #2   Title Pt will increase LEFS by at least 9 points in order to demonstrate significant improvement in lower extremity function.    Baseline 07/29/21: 26/80    Time 8    Period Weeks    Status New    Target Date 09/23/21      PT LONG TERM GOAL #3   Title Pt will improve painless L ankle active and passive  eversion to at least 14 degrees in order to demonstrate improved ankle mobility with less pain    Baseline 07/29/21: eversion: 10 degrees with pain    Time 8    Period Weeks    Status New    Target Date 09/23/21      PT LONG TERM GOAL #4   Title Pt will improve FOTO to at least 52 in order to demonstrate significant improvement in L ankle function    Baseline 07/29/21: 33    Time 8    Period Weeks    Status New    Target Date 09/23/21                   Plan - 08/19/21 1027     Clinical Impression Statement Pt reports continued discomfort/pain in L foot. Continued gastroc stretches, manual techniques, and tibialis posterior strengthening. Pt had to regress to isometric strengthening due to increase in pain during attempts at eccentric strengthening.  Also continued left ankle dorsiflexion, plantarflexion, and eversion strengthening.  Patient encouraged to continue HEP.  We will continue to progress manual techniques and strengthening at follow-up visit.  She will benefit from PT services to address deficits in L foot/ankle pain in order to return to full pain-free function at home.    Personal Factors and Comorbidities Past/Current Experience;Comorbidity 3+;Time since onset of injury/illness/exacerbation    Comorbidities HTN, hyperlipidemia, scarcoidosis, IBS, previous back surgery    Examination-Activity Limitations Stand    Examination-Participation Restrictions Community Activity;Cleaning    Stability/Clinical Decision Making Evolving/Moderate complexity    Rehab Potential Good    PT Frequency  1x / week    PT Duration 8 weeks   1 week   PT Treatment/Interventions Canalith Repostioning;Gait training;Stair training;Therapeutic activities;Balance training;Therapeutic exercise;Neuromuscular re-education;Patient/family education;Vestibular;Functional mobility training;ADLs/Self Care Home Management;Aquatic Therapy;Biofeedback;Electrical Stimulation;Cryotherapy;Iontophoresis 71m/ml Dexamethasone;Moist Heat;Ultrasound;Traction;Manual techniques;Passive range of motion;Dry needling;Taping;Joint Manipulations;Other (comment);Spinal Manipulations   phonophoresis   PT Next Visit Plan Initiate manual techniques, light stretching, and strengthening    PT Home Exercise Plan Access Code: [email or web address]   Consulted and Agree with Plan of Care Patient             Patient will benefit from skilled therapeutic intervention in order to improve the following deficits and impairments:  Pain, Difficulty walking  Visit Diagnosis: Pain in left foot     Problem List Patient Active Problem List   Diagnosis Date Noted   Pure hypercholesterolemia 11/04/2016   Essential hypertension 09/18/2015   Hyperlipidemia 09/18/2015   Recurrent genital herpes simplex 09/18/2015    JLyndel SafeHuprich PT, DPT, GCS  Rajon Bisig 08/19/2021, 4:13 PM  [place] [facility] [place] [address]Phone: [phone]  Fax:  [phone] Name: ATiawana ForgyMRN: [id number] of Birth: 120-Nov-1957

## 2021-08-30 ENCOUNTER — Ambulatory Visit: Payer: Medicaid Other | Attending: Physician Assistant

## 2021-08-30 ENCOUNTER — Other Ambulatory Visit: Payer: Self-pay

## 2021-08-30 DIAGNOSIS — M79672 Pain in left foot: Secondary | ICD-10-CM | POA: Insufficient documentation

## 2021-08-30 NOTE — Therapy (Signed)
[facility] [address] [place], [place], [address] Phone: [phone]   Fax:  [phone]  Physical Therapy Treatment/Discharge  Patient Details  Name: Cheyenne Bean MRN: [id number] Date of Birth: Jun 23, 1956 Referring Provider (PT): Dr. Gardiner Fanti   Encounter Date: 08/30/2021   PT End of Session - 08/30/21 0808     Visit Number 4    Number of Visits 9    Date for PT Re-Evaluation 09/23/21    Authorization Type eval: 07/29/21,    Authorization Time Period Medicaid approved 3 visits 08/05/21-09/01/21    Authorization - Visit Number 3    Authorization - Number of Visits 3    PT Start Time 0802    PT Stop Time 0845    PT Time Calculation (min) 43 min    Activity Tolerance Patient tolerated treatment well    Behavior During Therapy [facility] for tasks assessed/performed             Past Medical History:  Diagnosis Date   BRCA negative 01/2017   MyRisk neg   Family history of breast cancer    1/18 IBIS=10.7%   Family history of ovarian cancer    MyRisk neg   Genital herpes    Hyperlipidemia    Hypertension    IBS (irritable bowel syndrome) 02/20/2017   Sarcoidosis     Past Surgical History:  Procedure Laterality Date   BACK SURGERY     BUNIONECTOMY Left    COLONOSCOPY  2012   cleared for 10 yrs   COLONOSCOPY WITH PROPOFOL N/A 02/27/2017   Procedure: COLONOSCOPY WITH PROPOFOL;  Surgeon: Lollie Sails, MD;  Location: [facility] ENDOSCOPY;  Service: Endoscopy;  Laterality: N/A;   VAGINAL HYSTERECTOMY      There were no vitals filed for this visit.   Subjective Assessment - 08/30/21 0807     Subjective Pt arrives reporting 2/10 L foot pain upon arrival. She has not been very active this morning. No specific questions currently.    Pertinent History Pt reports that approximately 1 year ago she started having L foot pain. She was wearing a lot of flats around that time when the pain started started. The pain improved  after approximately 1 week without any intervention. She was visiting her dad in October 2021 and her foot pain flared up significantly again. She was unable ot put weight on her foot due to the pain and had to use crutches to help her walk. She went to the urgent care and was sent to general orthopedics who recommended orthotics and gave her two steroid shots on her medial foot near her L posterior tibial tendon. She reports improvement for 1-2 days and then the pain returned. After returning to [place] she saw general orthopedics who placed a medial heel wedge in her shoes. He also prescribed her Voltaren gel for topical anti-inflammatory use to be used up to 4 times/day. 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 The pain is located on the medial aspect of her L foot at the insertion on the medial navicular and she describes it as "achy." No other recent changes in medication or health. History of chronic back pain with LLE radicular pain to the calf. Pt reports she has had back surgery without improvement but does not recall what type of surgery she had. Pt has a history of sarcoidosis but states that she had a breathing test yesterday which was good. 07/29/21: Worsening pain in  L medial foot at the same location as previously, saw podiatrist who wrote an order for custom orthotics and encouraged her to continue with therapy. Plain film radiographs repeated.    Limitations Walking;Standing    How long can you stand comfortably? 1 hour    Diagnostic tests Plain film radiographs: see history    Patient Stated Goals Decrease foot pain               TREATMENT   Ther-ex   NuStep L2-4 x 5 minutes for warm-up during history BLE only (3 minutes unbilled); Updated outcome measures and goals with patient (see goal section); Standing gastroc stretch on step 60-second hold x 3; Standing bilateral heel raises on 6" step 2 x 20; Resisted L ankle dorsiflexion, plantarflexion, and eversion x  10 each; Manually resisted eccentric L ankle inversion strengthening, x 10;    Manual Therapy  Transverse friction massage applied to tibialis posterior tendon posterior to insertion and moving proximally along length of tendon into lower leg to improve tissue extensibility and for pain modulation. No visible signs of skin irritation afterward. Discussed and applied toe spreaders to help with neuroma;     Pt educated throughout session about proper posture and technique with exercises. Improved exercise technique, movement at target joints, use of target muscles after min to mod verbal, visual, tactile cues.     Patient arrives with excellent motivation to participate in therapy.  Updated outcome measures and goals with patient. Her worst pain has decreased from 6/10 to 4/10 and her L ankle eversion ROM has improved. Her FOTO and LEFS remain unchanged since the initial evaluation on 07/29/21. Pt reports continued discomfort/pain in L foot. Continued gastrocnemius stretches, manual techniques, and tibialis posterior strengthening.  Also continued left ankle dorsiflexion, plantarflexion, and eversion strengthening.  Patient encouraged to continue HEP.  Due to no further visits covered by Medicaid pt will be discharged on this date. Pt encouraged to follow-up with podiatry as needed.                          PT Short Term Goals - 08/30/21 3903       PT SHORT TERM GOAL #1   Title Pt will be independent with HEP in order to decrease foot pain and increase strength/ROM in order to improve pain-free function at home.    Time 4    Period Weeks    Status Achieved    Target Date 08/26/21               PT Long Term Goals - 08/30/21 0808       PT LONG TERM GOAL #1   Title Pt will decrease worst pain as reported on NPRS by at least 3 points in order to demonstrate clinically significant reduction in foot pain.    Baseline 07/29/21: 6/10; 08/30/21: 4/10;    Time 8    Period  Weeks    Status Partially Met      PT LONG TERM GOAL #2   Title Pt will increase LEFS by at least 9 points in order to demonstrate significant improvement in lower extremity function.    Baseline 07/29/21: 26/80; 08/30/21: 24/80    Time 8    Period Weeks    Status Not Met      PT LONG TERM GOAL #3   Title Pt will improve painless L ankle active and passive eversion to at least 14 degrees in order to demonstrate  improved ankle mobility with less pain    Baseline 07/29/21: eversion: 10 degrees with pain; 08/30/21: 16 degrees with pain;    Time 8    Period Weeks    Status Partially Met   Range of motion achieved but patient continues to report pain     PT LONG TERM GOAL #4   Title Pt will improve FOTO to at least 52 in order to demonstrate significant improvement in L ankle function    Baseline 07/29/21: 33; 08/30/21: 32    Time 8    Period Weeks    Status Not Met                   Plan - 08/30/21 4709     Clinical Impression Statement Patient arrives with excellent motivation to participate in therapy.  Updated outcome measures and goals with patient. Her worst pain has decreased from 6/10 to 4/10 and her L ankle eversion ROM has improved. Her FOTO and LEFS remain unchanged since the initial evaluation on 07/29/21. Pt reports continued discomfort/pain in L foot. Continued gastrocnemius stretches, manual techniques, and tibialis posterior strengthening.  Also continued left ankle dorsiflexion, plantarflexion, and eversion strengthening.  Patient encouraged to continue HEP.  Due to no further visits covered by Medicaid pt will be discharged on this date. Pt encouraged to follow-up with podiatry as needed.    Personal Factors and Comorbidities Past/Current Experience;Comorbidity 3+;Time since onset of injury/illness/exacerbation    Comorbidities HTN, hyperlipidemia, scarcoidosis, IBS, previous back surgery    Examination-Activity Limitations Stand    Examination-Participation Restrictions  Community Activity;Cleaning    Stability/Clinical Decision Making Evolving/Moderate complexity    Rehab Potential Good    PT Frequency 1x / week    PT Duration 8 weeks   1 week   PT Treatment/Interventions Canalith Repostioning;Gait training;Stair training;Therapeutic activities;Balance training;Therapeutic exercise;Neuromuscular re-education;Patient/family education;Vestibular;Functional mobility training;ADLs/Self Care Home Management;Aquatic Therapy;Biofeedback;Electrical Stimulation;Cryotherapy;Iontophoresis 79m/ml Dexamethasone;Moist Heat;Ultrasound;Traction;Manual techniques;Passive range of motion;Dry needling;Taping;Joint Manipulations;Other (comment);Spinal Manipulations   phonophoresis   PT Next Visit Plan Discharge    PT Home Exercise Plan Access Code: [email or web address]   Consulted and Agree with Plan of Care Patient             Patient will benefit from skilled therapeutic intervention in order to improve the following deficits and impairments:  Pain, Difficulty walking  Visit Diagnosis: Pain in left foot     Problem List Patient Active Problem List   Diagnosis Date Noted   Pure hypercholesterolemia 11/04/2016   Essential hypertension 09/18/2015   Hyperlipidemia 09/18/2015   Recurrent genital herpes simplex 09/18/2015    JLyndel SafeHuprich PT, DPT, GCS  Artis Buechele 08/30/2021, 9:17 AM  [place] [facility] [place] [address]Phone: [phone]  Fax:  [phone] Name: Cheyenne KaryMRN: [id number] of Birth: 109-07-1956

## 2021-09-16 ENCOUNTER — Other Ambulatory Visit: Payer: Self-pay | Admitting: Family Medicine

## 2021-09-16 DIAGNOSIS — E7800 Pure hypercholesterolemia, unspecified: Secondary | ICD-10-CM

## 2021-09-16 DIAGNOSIS — A6000 Herpesviral infection of urogenital system, unspecified: Secondary | ICD-10-CM

## 2021-09-16 NOTE — Telephone Encounter (Signed)
Requested medication (s) are due for refill today: yes  Requested medication (s) are on the active medication list: yes  Last refill:  pravachol- 07/29/21 #30 0 refills , valtrex - 07/29/21 #30 0 refills  Future visit scheduled: no  Notes to clinic:  last labs 04/03/20 . Requesting #90 pravachol. Do you want to refill  both Rx?     Requested Prescriptions  Pending Prescriptions Disp Refills   pravastatin (PRAVACHOL) 20 MG tablet [Pharmacy Med Name: Pravastatin Sodium 20 MG Oral Tablet] 90 tablet 0    Sig: Take 1 tablet by mouth once daily     Cardiovascular:  Antilipid - Statins Failed - 09/16/2021 10:07 AM      Failed - Total Cholesterol in normal range and within 360 days    Cholesterol, Total  Date Value Ref Range Status  04/03/2020 170 100 - 199 mg/dL Final          Failed - LDL in normal range and within 360 days    LDL Chol Calc (NIH)  Date Value Ref Range Status  04/03/2020 112 (H) 0 - 99 mg/dL Final          Failed - HDL in normal range and within 360 days    HDL  Date Value Ref Range Status  04/03/2020 45 >39 mg/dL Final          Failed - Triglycerides in normal range and within 360 days    Triglycerides  Date Value Ref Range Status  04/03/2020 65 0 - 149 mg/dL Final          Passed - Patient is not pregnant      Passed - Valid encounter within last 12 months    Recent Outpatient Visits           5 months ago Left forearm pain   [facility] Duanne Limerick, MD   6 months ago Acne vulgaris   [facility] Duanne Limerick, MD   9 months ago Gastrocnemius strain, right, initial encounter   [facility] Duanne Limerick, MD   1 year ago Essential hypertension   [facility] Duanne Limerick, MD   2 years ago Essential hypertension   [facility] Duanne Limerick, MD               valACYclovir (VALTREX) 500 MG tablet [Pharmacy Med Name: valACYclovir HCl 500 MG Oral Tablet] 30 tablet 0    Sig: TAKE 1 TABLET  BY MOUTH ONCE DAILY . APPOINTMENT REQUIRED FOR FUTURE REFILLS     Antimicrobials:  Antiviral Agents - Anti-Herpetic Passed - 09/16/2021 10:07 AM      Passed - Valid encounter within last 12 months    Recent Outpatient Visits           5 months ago Left forearm pain   [facility] Duanne Limerick, MD   6 months ago Acne vulgaris   [facility] Duanne Limerick, MD   9 months ago Gastrocnemius strain, right, initial encounter   [facility] Duanne Limerick, MD   1 year ago Essential hypertension   [facility] Duanne Limerick, MD   2 years ago Essential hypertension   [facility] Duanne Limerick, MD

## 2021-10-14 ENCOUNTER — Other Ambulatory Visit: Payer: Self-pay

## 2021-10-14 ENCOUNTER — Ambulatory Visit (INDEPENDENT_AMBULATORY_CARE_PROVIDER_SITE_OTHER): Payer: Medicaid Other | Admitting: Family Medicine

## 2021-10-14 VITALS — BP 138/80 | HR 80 | Ht 64.5 in | Wt 153.0 lb

## 2021-10-14 DIAGNOSIS — Z23 Encounter for immunization: Secondary | ICD-10-CM

## 2021-10-14 DIAGNOSIS — A6000 Herpesviral infection of urogenital system, unspecified: Secondary | ICD-10-CM | POA: Diagnosis not present

## 2021-10-14 DIAGNOSIS — E7800 Pure hypercholesterolemia, unspecified: Secondary | ICD-10-CM

## 2021-10-14 MED ORDER — VALACYCLOVIR HCL 500 MG PO TABS: ORAL_TABLET | ORAL | 0 refills | Status: DC

## 2021-10-14 MED ORDER — PRAVASTATIN SODIUM 20 MG PO TABS: 20.0000 mg | ORAL_TABLET | Freq: Every day | ORAL | 1 refills | Status: DC

## 2021-10-14 NOTE — Progress Notes (Signed)
Date:  10/14/2021   Name:  Cheyenne Bean   DOB:  June 21, 1956   MRN:  [id number]   Chief Complaint: Hyperlipidemia and herpes complex  Hyperlipidemia This is a chronic problem. The current episode started more than 1 year ago. The problem is controlled. Recent lipid tests were reviewed and are normal. She has no history of chronic renal disease, diabetes, hypothyroidism, liver disease, obesity or nephrotic syndrome. Factors aggravating her hyperlipidemia include thiazides. Pertinent negatives include no chest pain, myalgias or shortness of breath. Current antihyperlipidemic treatment includes statins. The current treatment provides moderate improvement of lipids. There are no compliance problems.  Risk factors for coronary artery disease include dyslipidemia.   Lab Results  Component Value Date   CREATININE 0.80 12/13/2020   BUN 18 12/13/2020   NA 139 12/13/2020   K 3.7 12/13/2020   CL 104 12/13/2020   CO2 28 12/13/2020   Lab Results  Component Value Date   CHOL 170 04/03/2020   HDL 45 04/03/2020   LDLCALC 112 (H) 04/03/2020   TRIG 65 04/03/2020   CHOLHDL 3.2 03/26/2018   No results found for: TSH No results found for: HGBA1C Lab Results  Component Value Date   WBC 5.5 12/13/2020   HGB 14.4 12/13/2020   HCT 43.3 12/13/2020   MCV 87.1 12/13/2020   PLT 199 12/13/2020   Lab Results  Component Value Date   ALT 24 08/05/2019   AST 21 08/05/2019   ALKPHOS 101 08/05/2019   BILITOT 0.3 08/05/2019     Review of Systems  Constitutional:  Negative for chills and fever.  HENT:  Negative for drooling, ear discharge, ear pain and sore throat.   Respiratory:  Negative for cough, shortness of breath and wheezing.   Cardiovascular:  Negative for chest pain, palpitations and leg swelling.  Gastrointestinal:  Negative for abdominal pain, blood in stool, constipation, diarrhea and nausea.  Endocrine: Negative for polydipsia.  Genitourinary:  Negative for dysuria, frequency,  hematuria and urgency.  Musculoskeletal:  Negative for back pain, myalgias and neck pain.  Skin:  Negative for rash.  Allergic/Immunologic: Negative for environmental allergies.  Neurological:  Negative for dizziness and headaches.  Hematological:  Does not bruise/bleed easily.  Psychiatric/Behavioral:  Negative for suicidal ideas. The patient is not nervous/anxious.    Patient Active Problem List   Diagnosis Date Noted   Pure hypercholesterolemia 11/04/2016   Essential hypertension 09/18/2015   Hyperlipidemia 09/18/2015   Recurrent genital herpes simplex 09/18/2015    No Known Allergies  Past Surgical History:  Procedure Laterality Date   BACK SURGERY     BUNIONECTOMY Left    COLONOSCOPY  2012   cleared for 10 yrs   COLONOSCOPY WITH PROPOFOL N/A 02/27/2017   Procedure: COLONOSCOPY WITH PROPOFOL;  Surgeon: Christena Deem, MD;  Location: [facility] ENDOSCOPY;  Service: Endoscopy;  Laterality: N/A;   VAGINAL HYSTERECTOMY      Social History   Tobacco Use   Smoking status: Never   Smokeless tobacco: Never  Vaping Use   Vaping Use: Never used  Substance Use Topics   Alcohol use: No    Alcohol/week: 0.0 standard drinks   Drug use: No     Medication list has been reviewed and updated.  Current Meds  Medication Sig   amLODipine (NORVASC) 5 MG tablet Take 1.5 tablets by mouth daily. Take 1.5 tablets daily= 7.5mg / Gwen Pounds   diclofenac sodium (VOLTAREN) 1 % GEL Apply 2 g topically 4 (four) times daily.  pravastatin (PRAVACHOL) 20 MG tablet Take 1 tablet by mouth once daily   valACYclovir (VALTREX) 500 MG tablet TAKE 1 TABLET BY MOUTH ONCE DAILY . APPOINTMENT REQUIRED FOR FUTURE REFILLS   [DISCONTINUED] cyclobenzaprine (FLEXERIL) 5 MG tablet Take 1 tablet (5 mg total) by mouth 3 (three) times daily as needed for muscle spasms.   [DISCONTINUED] FLUoxetine (PROZAC) 40 MG capsule Take 1 capsule by mouth daily. Behavioral health    [facility] 2/9 Scores 10/14/2021 03/06/2021 11/28/2020  04/03/2020  PHQ - 2 Score 0 0 0 2  PHQ- 9 Score 0 0 0 2    GAD 7 : Generalized Anxiety Score 10/14/2021 11/28/2020 04/03/2020  Nervous, Anxious, on Edge 0 0 0  Control/stop worrying 0 0 0  Worry too much - different things 0 0 0  Trouble relaxing 0 0 0  Restless 0 0 0  Easily annoyed or irritable 0 0 0  Afraid - awful might happen 0 0 0  Total GAD 7 Score 0 0 0    BP Readings from Last 3 Encounters:  10/14/21 138/80  04/30/21 (!) 159/97  04/27/21 (!) 146/94    Physical Exam Vitals and nursing note reviewed.  HENT:     Right Ear: Tympanic membrane and ear canal normal.     Left Ear: Tympanic membrane and ear canal normal.     Nose: Nose normal. No congestion or rhinorrhea.  Cardiovascular:     Pulses: Normal pulses.     Heart sounds: No murmur heard.   No friction rub. No gallop.  Pulmonary:     Breath sounds: No wheezing or rhonchi.  Musculoskeletal:     Right upper leg: No swelling, edema, deformity or tenderness.     Left upper leg: No swelling, edema, deformity or tenderness.     Comments: Symmetrical delineation of quadraceps  Neurological:     General: No focal deficit present.     Motor: No weakness.    Wt Readings from Last 3 Encounters:  10/14/21 153 lb (69.4 kg)  04/30/21 151 lb 14.4 oz (68.9 kg)  04/27/21 152 lb (68.9 kg)    BP 138/80   Pulse 80   Ht 5' 4.5" (1.638 m)   Wt 153 lb (69.4 kg)   BMI 25.86 kg/m   Assessment and Plan:  1. Pure hypercholesterolemia Chronic.  Controlled.  Stable.  Continue pravastatin 20 mg once a day.  Will check lipid panel and CMP for hepatic concerns. - pravastatin (PRAVACHOL) 20 MG tablet; Take 1 tablet (20 mg total) by mouth daily.  Dispense: 90 tablet; Refill: 1 - Lipid Panel With LDL/HDL Ratio - Comprehensive Metabolic Panel (CMET)  2. Recurrent genital herpes simplex Chronic.  Controlled.  Stable.  Continue valacyclovir 100 mg 1 a day. - valACYclovir (VALTREX) 500 MG tablet; TAKE 1 TABLET BY MOUTH ONCE DAILY .   Dispense: 30 tablet; Refill: 0  3. Need for immunization against influenza Discussed and administered. - Flu Vaccine QUAD 63mo+IM (Fluarix, Fluzone & Alfiuria Quad PF)   Patient has noticed some dimpling of both upper thighs but this really is delineation of the quadricep on both sides there is no palpable nodes no palpable mass, no palpable tenderness, and no palpable adenopathy.  And it is symmetrical in nature.  I believe this is the delineation of the upper quadricep and sulcus that is formed with the surrounding adipose tissue.

## 2021-10-15 LAB — LIPID PANEL WITH LDL/HDL RATIO
Cholesterol, Total: 157 mg/dL (ref 100–199)
HDL: 50 mg/dL (ref >39)
LDL Chol Calc (NIH): 93 mg/dL (ref 0–99)
LDL/HDL Ratio: 1.9 ratio (ref 0.0–3.2)
Triglycerides: 69 mg/dL (ref 0–149)
VLDL Cholesterol Cal: 14 mg/dL (ref 5–40)

## 2021-10-15 LAB — COMPREHENSIVE METABOLIC PANEL
ALT: 19 IU/L (ref 0–32)
AST: 18 IU/L (ref 0–40)
Albumin/Globulin Ratio: 1.5 (ref 1.2–2.2)
Albumin: 4.8 g/dL (ref 3.8–4.8)
Alkaline Phosphatase: 113 IU/L (ref 44–121)
BUN/Creatinine Ratio: 16 (ref 12–28)
BUN: 12 mg/dL (ref 8–27)
Bilirubin Total: 0.4 mg/dL (ref 0.0–1.2)
CO2: 26 mmol/L (ref 20–29)
Calcium: 9.8 mg/dL (ref 8.7–10.3)
Chloride: 104 mmol/L (ref 96–106)
Creatinine, Ser: 0.75 mg/dL (ref 0.57–1.00)
Globulin, Total: 3.1 g/dL (ref 1.5–4.5)
Glucose: 85 mg/dL (ref 70–99)
Potassium: 4.7 mmol/L (ref 3.5–5.2)
Sodium: 141 mmol/L (ref 134–144)
Total Protein: 7.9 g/dL (ref 6.0–8.5)
eGFR: 90 mL/min/{1.73_m2} (ref >59)

## 2021-10-17 ENCOUNTER — Ambulatory Visit: Payer: Self-pay | Admitting: *Deleted

## 2021-10-17 ENCOUNTER — Other Ambulatory Visit: Payer: Self-pay

## 2021-10-17 DIAGNOSIS — R42 Dizziness and giddiness: Secondary | ICD-10-CM

## 2021-10-17 MED ORDER — MECLIZINE HCL 25 MG PO TABS
25.0000 mg | ORAL_TABLET | ORAL | 0 refills | Status: DC | PRN
Start: 2021-10-17 — End: 2024-07-11

## 2021-10-17 NOTE — Telephone Encounter (Signed)
Reason for Disposition  [1] MODERATE dizziness (e.g., vertigo; feels very unsteady, interferes with normal activities) AND [2] has been evaluated by physician for this  Answer Assessment - Initial Assessment Questions 1. DESCRIPTION: "Describe your dizziness."     Pt calling in.   I've had vertigo before a yr.    Yesterday it came back. 2. VERTIGO: "Do you feel like either you or the room is spinning or tilting?"      When I get up I'm leaning to the side and off balance.  I felt like this before. 3. LIGHTHEADED: "Do you feel lightheaded?" (e.g., somewhat faint, woozy, weak upon standing)     No 4. SEVERITY: "How bad is it?"  "Can you walk?"   - MILD: Feels slightly dizzy and unsteady, but is walking normally.   - MODERATE: Feels unsteady when walking, but not falling; interferes with normal activities (e.g., school, work).   - SEVERE: Unable to walk without falling, or requires assistance to walk without falling.     Moderate holding onto things to keep steady. 5. ONSET:  "When did the dizziness begin?"     My right ear was tender and when I went to bed I was feeling a little dizziness.   Yesterday and today it's bad 6. AGGRAVATING FACTORS: "Does anything make it worse?" (e.g., standing, change in head position)     Moving my head makes it worse. 7. CAUSE: "What do you think is causing the dizziness?"     Vertigo.   I had it about a year ago. 8. RECURRENT SYMPTOM: "Have you had dizziness before?" If Yes, ask: "When was the last time?" "What happened that time?"     Yes  I took meclizine.   I don't have left over the meclizine.   9. OTHER SYMPTOMS: "Do you have any other symptoms?" (e.g., headache, weakness, numbness, vomiting, earache)     Right ear is tender and having nausea.    10. PREGNANCY: "Is there any chance you are pregnant?" "When was your last menstrual period?"       N/A due to age  Protocols used: Dizziness - Vertigo-A-AH

## 2021-10-17 NOTE — Progress Notes (Signed)
Sent in meclizine 

## 2021-10-17 NOTE — Telephone Encounter (Signed)
Pt called in c/o vertigo that started yesterday and is still present today.   She had vertigo a year ago and was treated with meclizine but she doesn't have any and is requesting Dr. Yetta Barre send in an Rx for it.  I went over the care advice.  I let her know I would send a high priority note to Dr. Elizabeth Sauer requesting she call in some meclizine since she has had vertigo before and was just seen on Monday by Dr. Yetta Barre though the vertigo wasn't present during that visit.    Someone will call her back.  She was agreeable to this plan.  Phone number in chart is correct.  I sent a high priority note to [facility] for Dr. Elizabeth Sauer.

## 2021-12-04 IMAGING — MR MR MRA HEAD W/O CM
1 series · 23 of 48 positions shown · non-contrast
Comparison: Brain MRI 11/25/2019

CLINICAL DATA: History of vertigo for many years. Daily dizziness
for 6 months

EXAM:
MRA HEAD WITHOUT CONTRAST
TECHNIQUE: Angiographic images of the Circle of Willis were obtained using MRA
technique without intravenous contrast.

[Series 3: tof_3d_multi-slab · axial · 0.7mm · 0.35mm/px · z∈[-75,+27]mm · 23 of 156 slices shown]
[im 1/156]
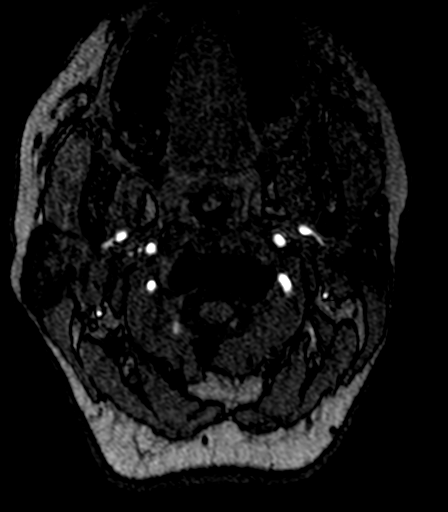
[im 4/156]
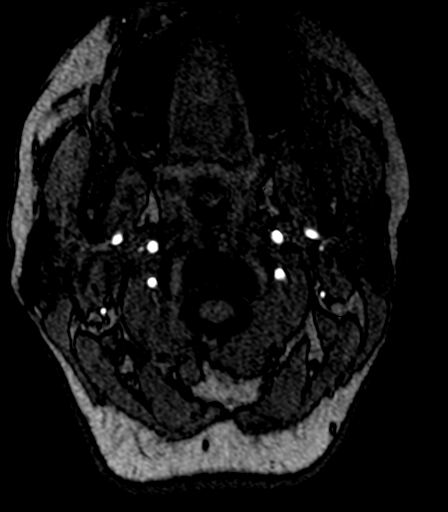
[im 7/156]
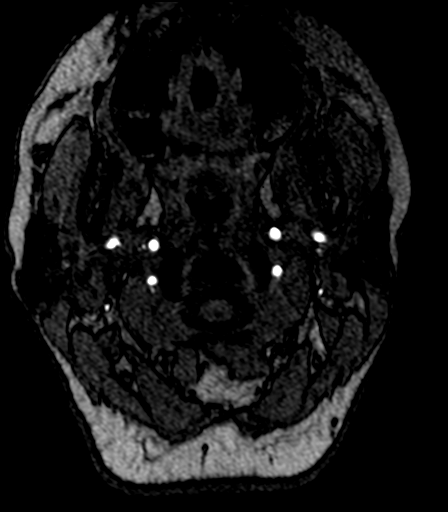
[im 10/156]
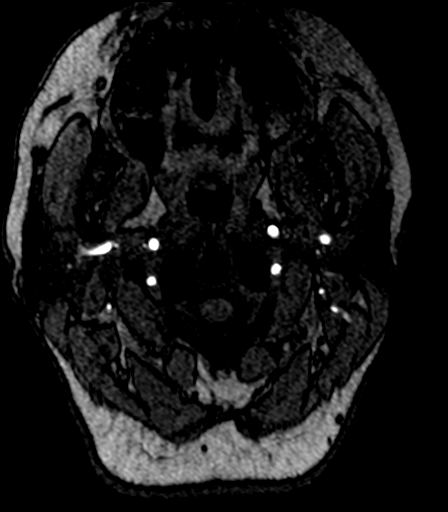
[im 14/156]
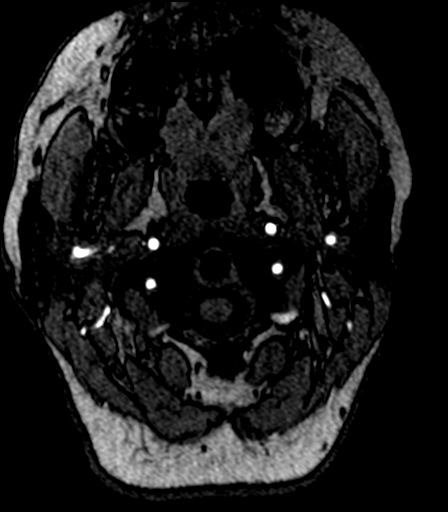
[im 17/156]
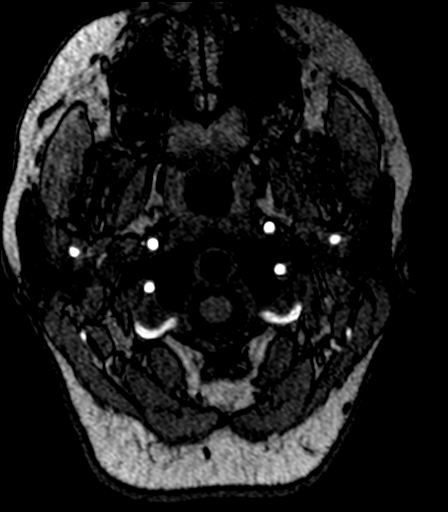
[im 20/156]
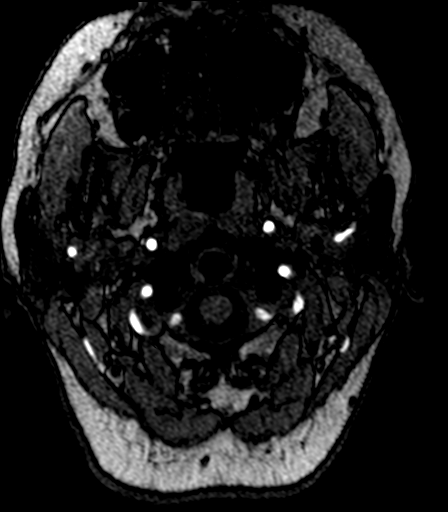
[im 24/156]
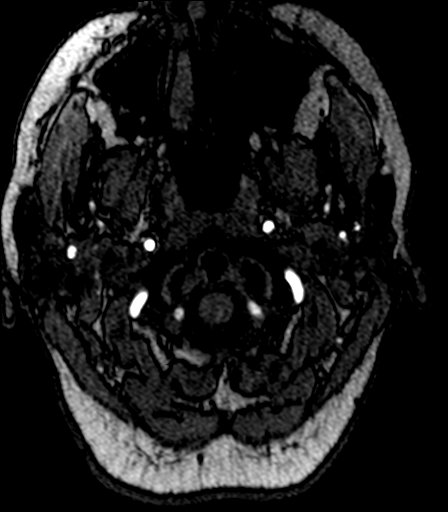
[im 27/156]
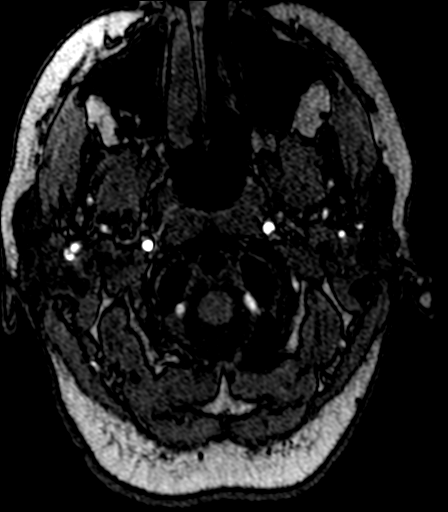
[im 30/156]
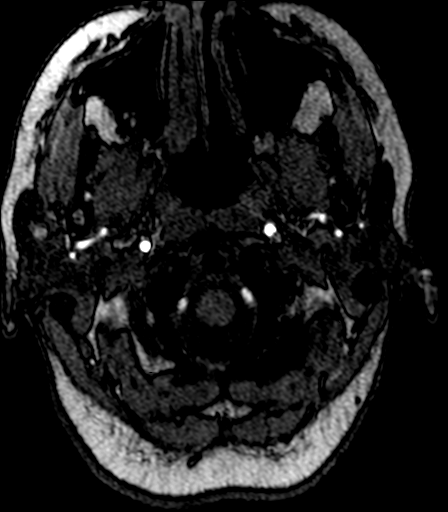
[im 33/156]
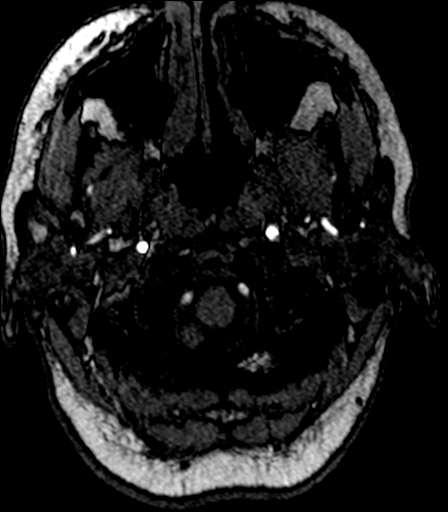
[im 37/156]
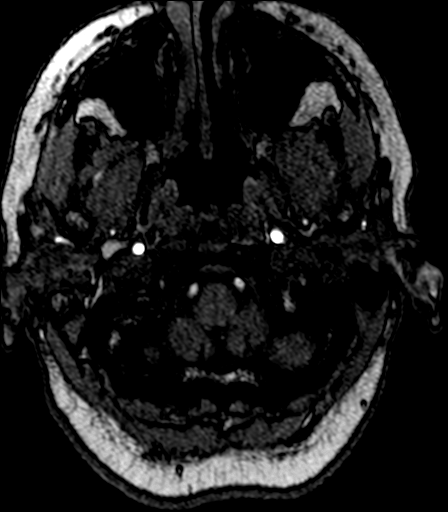
[im 40/156]
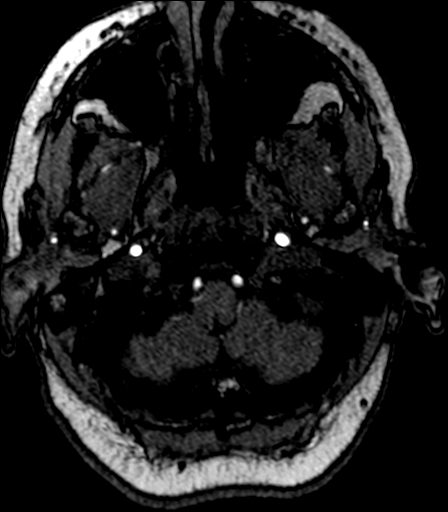
[im 43/156]
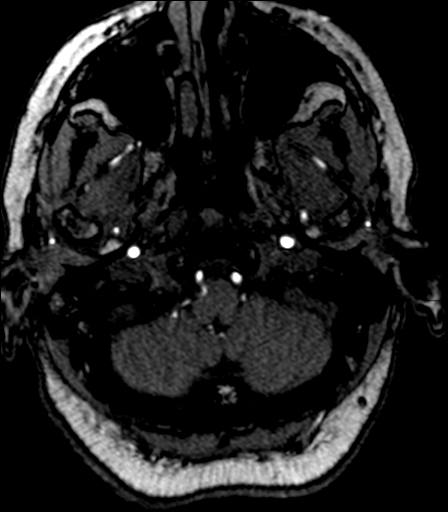
[im 47/156]
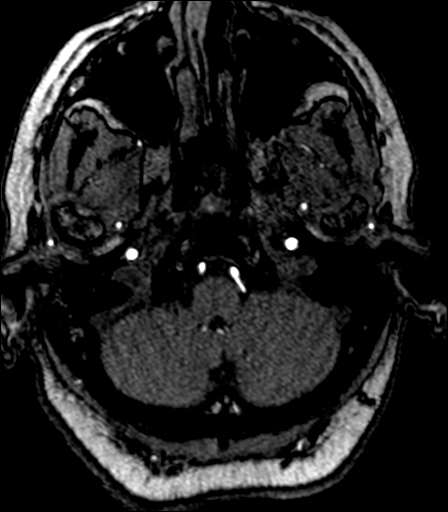
[im 50/156]
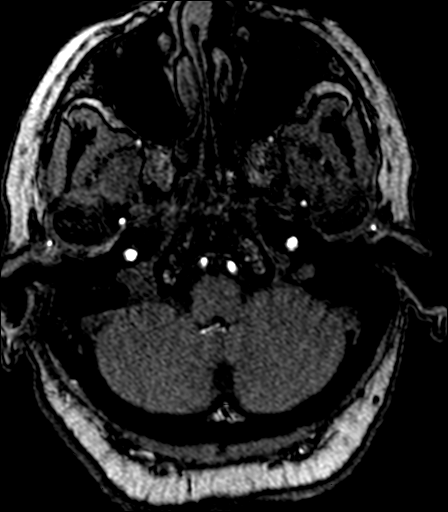
[im 70/156]
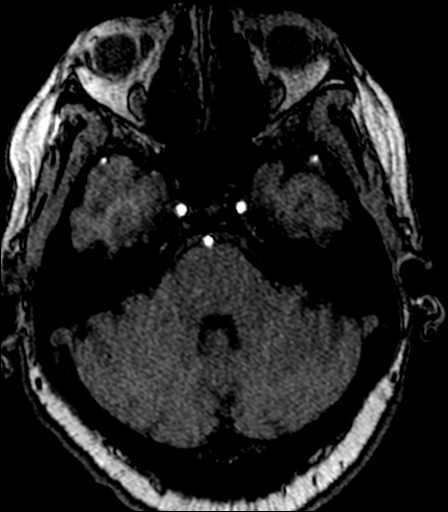
[im 80/156]
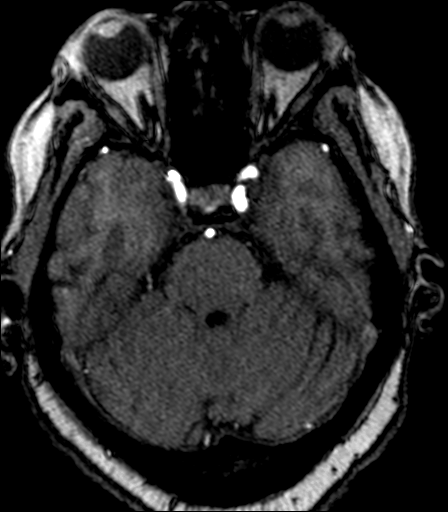
[im 90/156]
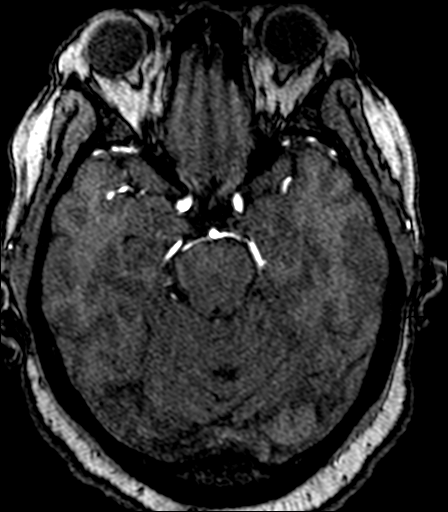
[im 109/156]
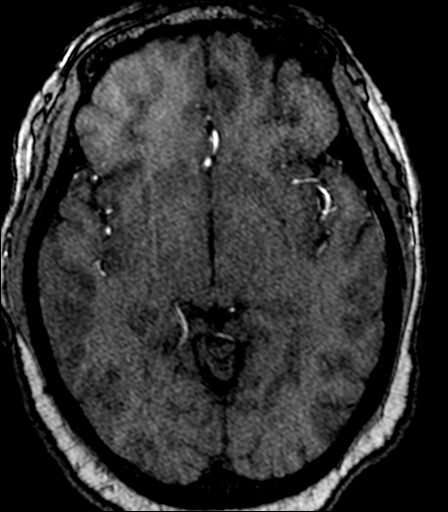
[im 129/156]
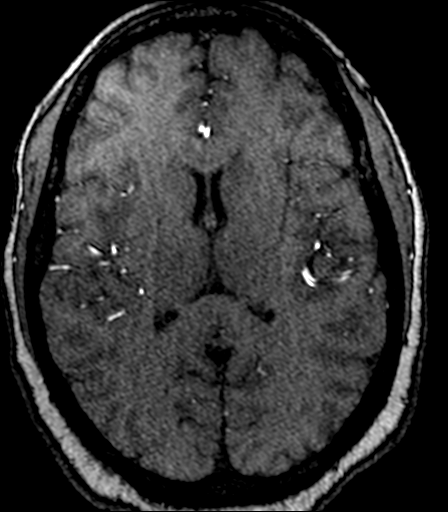
[im 132/156]
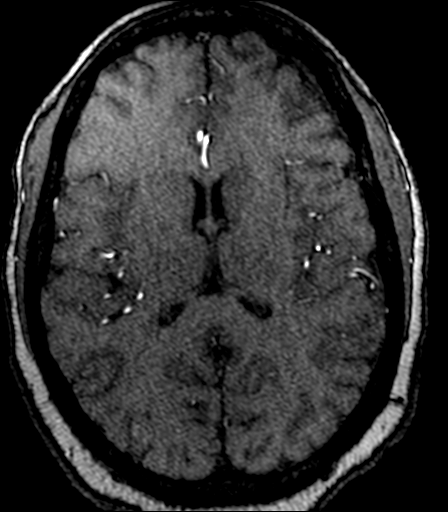
[im 149/156]
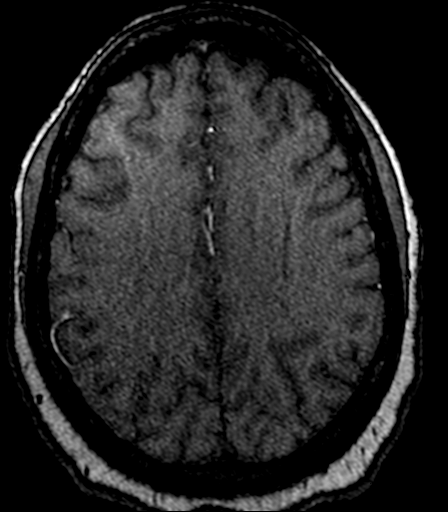

[23 of 48 positions shown; findings below may reference images not displayed]

FINDINGS: The carotid, vertebral, and basilar arteries are smooth and widely
patent when accounting for artifactual irregularity of the carotids
at the skull base. There is no branch occlusion, beading, or
aneurysm. Incomplete circle-of-Willis with no visible posterior
communicating arteries or anterior communicating arteries.
IMPRESSION: Negative intracranial MRA.  No vertebrobasilar stenosis.

## 2021-12-14 ENCOUNTER — Other Ambulatory Visit: Payer: Self-pay | Admitting: Family Medicine

## 2021-12-14 DIAGNOSIS — A6000 Herpesviral infection of urogenital system, unspecified: Secondary | ICD-10-CM

## 2021-12-14 NOTE — Telephone Encounter (Signed)
Requested Prescriptions  Pending Prescriptions Disp Refills   valACYclovir (VALTREX) 500 MG tablet [Pharmacy Med Name: valACYclovir HCl 500 MG Oral Tablet] 30 tablet 0    Sig: Take 1 tablet by mouth once daily     Antimicrobials:  Antiviral Agents - Anti-Herpetic Passed - 12/14/2021  5:55 PM      Passed - Valid encounter within last 12 months    Recent Outpatient Visits          2 months ago Pure hypercholesterolemia   [facility] Duanne Limerick, MD   8 months ago Left forearm pain   [facility] Duanne Limerick, MD   9 months ago Acne vulgaris   [facility] Duanne Limerick, MD   1 year ago Gastrocnemius strain, right, initial encounter   [facility] Duanne Limerick, MD   1 year ago Essential hypertension   [facility] Duanne Limerick, MD

## 2021-12-27 ENCOUNTER — Encounter: Payer: Self-pay | Admitting: Family Medicine

## 2021-12-27 ENCOUNTER — Other Ambulatory Visit: Payer: Self-pay

## 2021-12-27 ENCOUNTER — Ambulatory Visit (INDEPENDENT_AMBULATORY_CARE_PROVIDER_SITE_OTHER): Payer: Medicaid Other | Admitting: Family Medicine

## 2021-12-27 VITALS — BP 130/80 | HR 60 | Ht 64.5 in | Wt 151.0 lb

## 2021-12-27 DIAGNOSIS — R109 Unspecified abdominal pain: Secondary | ICD-10-CM | POA: Diagnosis not present

## 2021-12-27 LAB — POCT URINALYSIS DIPSTICK
Bilirubin, UA: NEGATIVE
Blood, UA: NEGATIVE
Glucose, UA: NEGATIVE
Ketones, UA: NEGATIVE
Leukocytes, UA: NEGATIVE
Nitrite, UA: NEGATIVE
Protein, UA: NEGATIVE
Spec Grav, UA: 1.02 (ref 1.010–1.025)
Urobilinogen, UA: 0.2 E.U./dL
pH, UA: 6 (ref 5.0–8.0)

## 2021-12-27 NOTE — Progress Notes (Signed)
Date:  12/27/2021   Name:  Cheyenne Bean   DOB:  06-Nov-1956   MRN:  [id number]   Chief Complaint: Flank Pain (R) sided pain, no pain when urinates)  Flank Pain This is a new problem. The current episode started in the past 7 days. The problem occurs intermittently. The quality of the pain is described as aching. Radiates to: back. The pain is moderate. Associated symptoms include abdominal pain and pelvic pain. Pertinent negatives include no bladder incontinence, bowel incontinence, chest pain, dysuria, fever, headaches or numbness. She has tried NSAIDs for the symptoms. The treatment provided mild relief.   Lab Results  Component Value Date   NA 141 10/14/2021   K 4.7 10/14/2021   CO2 26 10/14/2021   GLUCOSE 85 10/14/2021   BUN 12 10/14/2021   CREATININE 0.75 10/14/2021   CALCIUM 9.8 10/14/2021   EGFR 90 10/14/2021   GFRNONAA >60 12/13/2020   Lab Results  Component Value Date   CHOL 157 10/14/2021   HDL 50 10/14/2021   LDLCALC 93 10/14/2021   TRIG 69 10/14/2021   CHOLHDL 3.2 03/26/2018   No results found for: TSH No results found for: HGBA1C Lab Results  Component Value Date   WBC 5.5 12/13/2020   HGB 14.4 12/13/2020   HCT 43.3 12/13/2020   MCV 87.1 12/13/2020   PLT 199 12/13/2020   Lab Results  Component Value Date   ALT 19 10/14/2021   AST 18 10/14/2021   ALKPHOS 113 10/14/2021   BILITOT 0.4 10/14/2021   No results found for: 25OHVITD2, 25OHVITD3, VD25OH   Review of Systems  Constitutional:  Negative for chills and fever.  HENT:  Negative for drooling, ear discharge, ear pain and sore throat.   Respiratory:  Negative for cough, shortness of breath and wheezing.   Cardiovascular:  Negative for chest pain, palpitations and leg swelling.  Gastrointestinal:  Positive for abdominal pain. Negative for abdominal distention, blood in stool, bowel incontinence, constipation, diarrhea, nausea and vomiting.  Endocrine: Negative for polydipsia.  Genitourinary:   Positive for flank pain and pelvic pain. Negative for bladder incontinence, dysuria, frequency, hematuria, menstrual problem and urgency.  Musculoskeletal:  Negative for back pain, myalgias and neck pain.  Skin:  Negative for rash.  Allergic/Immunologic: Negative for environmental allergies.  Neurological:  Negative for dizziness, numbness and headaches.  Hematological:  Does not bruise/bleed easily.  Psychiatric/Behavioral:  Negative for suicidal ideas. The patient is not nervous/anxious.    Patient Active Problem List   Diagnosis Date Noted   Pure hypercholesterolemia 11/04/2016   Essential hypertension 09/18/2015   Hyperlipidemia 09/18/2015   Recurrent genital herpes simplex 09/18/2015    No Known Allergies  Past Surgical History:  Procedure Laterality Date   BACK SURGERY     BUNIONECTOMY Left    COLONOSCOPY  2012   cleared for 10 yrs   COLONOSCOPY WITH PROPOFOL N/A 02/27/2017   Procedure: COLONOSCOPY WITH PROPOFOL;  Surgeon: Lollie Sails, MD;  Location: [facility] ENDOSCOPY;  Service: Endoscopy;  Laterality: N/A;   VAGINAL HYSTERECTOMY      Social History   Tobacco Use   Smoking status: Never   Smokeless tobacco: Never  Vaping Use   Vaping Use: Never used  Substance Use Topics   Alcohol use: No    Alcohol/week: 0.0 standard drinks   Drug use: No     Medication list has been reviewed and updated.  Current Meds  Medication Sig   amLODipine (NORVASC) 5 MG tablet Take  1.5 tablets by mouth daily. Take 1.5 tablets daily= 7.7m/ Kowalski   diclofenac sodium (VOLTAREN) 1 % GEL Apply 2 g topically 4 (four) times daily.   ibuprofen (ADVIL) 600 MG tablet Take 1 tablet (600 mg total) by mouth every 8 (eight) hours as needed.   meclizine (ANTIVERT) 25 MG tablet Take 1 tablet (25 mg total) by mouth as needed for dizziness.   pravastatin (PRAVACHOL) 20 MG tablet Take 1 tablet (20 mg total) by mouth daily.   valACYclovir (VALTREX) 500 MG tablet Take 1 tablet by mouth once daily     PHQ 2/9 Scores 10/14/2021 03/06/2021 11/28/2020 04/03/2020  PHQ - 2 Score 0 0 0 2  PHQ- 9 Score 0 0 0 2    GAD 7 : Generalized Anxiety Score 10/14/2021 11/28/2020 04/03/2020  Nervous, Anxious, on Edge 0 0 0  Control/stop worrying 0 0 0  Worry too much - different things 0 0 0  Trouble relaxing 0 0 0  Restless 0 0 0  Easily annoyed or irritable 0 0 0  Afraid - awful might happen 0 0 0  Total GAD 7 Score 0 0 0    BP Readings from Last 3 Encounters:  12/27/21 130/80  10/14/21 138/80  04/30/21 (!) 159/97    Physical Exam Vitals and nursing note reviewed.  Constitutional:      Appearance: She is well-developed.  HENT:     Head: Normocephalic.     Right Ear: Tympanic membrane and external ear normal.     Left Ear: Tympanic membrane and external ear normal.     Nose: Nose normal.  Eyes:     General: Lids are everted, no foreign bodies appreciated. No scleral icterus.       Left eye: No foreign body or hordeolum.     Conjunctiva/sclera: Conjunctivae normal.     Right eye: Right conjunctiva is not injected.     Left eye: Left conjunctiva is not injected.     Pupils: Pupils are equal, round, and reactive to light.  Neck:     Thyroid: No thyromegaly.     Vascular: No JVD.     Trachea: No tracheal deviation.  Cardiovascular:     Rate and Rhythm: Normal rate and regular rhythm.     Heart sounds: Normal heart sounds. No murmur heard.   No friction rub. No gallop.  Pulmonary:     Effort: Pulmonary effort is normal. No respiratory distress.     Breath sounds: Normal breath sounds. No wheezing, rhonchi or rales.  Abdominal:     General: Bowel sounds are normal.     Palpations: Abdomen is soft. There is no hepatomegaly, splenomegaly or mass.     Tenderness: There is abdominal tenderness in the right upper quadrant and right lower quadrant. There is no right CVA tenderness, left CVA tenderness, guarding or rebound.     Comments: Negative Carnett sign  Musculoskeletal:         General: No tenderness. Normal range of motion.     Cervical back: Normal range of motion and neck supple.  Lymphadenopathy:     Cervical: No cervical adenopathy.  Skin:    Findings: No rash.  Neurological:     Mental Status: She is alert and oriented to person, place, and time.     Cranial Nerves: No cranial nerve deficit.     Deep Tendon Reflexes: Reflexes normal.  Psychiatric:        Mood and Affect: Mood is not anxious or depressed.  Wt Readings from Last 3 Encounters:  12/27/21 151 lb (68.5 kg)  10/14/21 153 lb (69.4 kg)  04/30/21 151 lb 14.4 oz (68.9 kg)    BP 130/80    Pulse 60    Ht 5' 4.5" (1.638 m)    Wt 151 lb (68.5 kg)    BMI 25.52 kg/m   Assessment and Plan:  1. Flank pain New onset.  Episodic.  Is somewhat better today than earlier in the week.  Exam is soft with mild tenderness in the right upper quadrant and right lower quadrant but without guarding and rebound.  Carnett's sign is normal urinalysis is unremarkable.  Will obtain a CBC hepatic panel and lipase.  Inpatient has been told to go to the hospital with pain should increase again and to check in with her gynecologist in case there is any thing of an ovarian nature that needs to be further evaluated. - POCT urinalysis dipstick - CBC with Differential/Platelet - Hepatic Function Panel (6) - Lipase

## 2021-12-28 LAB — LIPASE: Lipase: 43 U/L (ref 14–72)

## 2021-12-28 LAB — CBC WITH DIFFERENTIAL/PLATELET
Basophils Absolute: 0 10*3/uL (ref 0.0–0.2)
Basos: 1 %
EOS (ABSOLUTE): 0.3 10*3/uL (ref 0.0–0.4)
Eos: 5 %
Hematocrit: 41.3 % (ref 34.0–46.6)
Hemoglobin: 14 g/dL (ref 11.1–15.9)
Immature Grans (Abs): 0 10*3/uL (ref 0.0–0.1)
Immature Granulocytes: 0 %
Lymphocytes Absolute: 2.4 10*3/uL (ref 0.7–3.1)
Lymphs: 46 %
MCH: 29.4 pg (ref 26.6–33.0)
MCHC: 33.9 g/dL (ref 31.5–35.7)
MCV: 87 fL (ref 79–97)
Monocytes Absolute: 0.5 10*3/uL (ref 0.1–0.9)
Monocytes: 9 %
Neutrophils Absolute: 2 10*3/uL (ref 1.4–7.0)
Neutrophils: 39 %
Platelets: 236 10*3/uL (ref 150–450)
RBC: 4.77 x10E6/uL (ref 3.77–5.28)
RDW: 13.1 % (ref 11.7–15.4)
WBC: 5.2 10*3/uL (ref 3.4–10.8)

## 2021-12-28 LAB — HEPATIC FUNCTION PANEL (6)
ALT: 17 IU/L (ref 0–32)
AST: 20 IU/L (ref 0–40)
Albumin: 4.6 g/dL (ref 3.8–4.8)
Alkaline Phosphatase: 123 IU/L — ABNORMAL HIGH (ref 44–121)
Bilirubin Total: 0.3 mg/dL (ref 0.0–1.2)
Bilirubin, Direct: <0.1 mg/dL (ref 0.00–0.40)

## 2022-01-06 ENCOUNTER — Other Ambulatory Visit: Payer: Self-pay | Admitting: Family Medicine

## 2022-01-06 DIAGNOSIS — A6000 Herpesviral infection of urogenital system, unspecified: Secondary | ICD-10-CM

## 2022-01-27 ENCOUNTER — Ambulatory Visit (INDEPENDENT_AMBULATORY_CARE_PROVIDER_SITE_OTHER): Payer: Medicaid Other | Admitting: Obstetrics and Gynecology

## 2022-01-27 ENCOUNTER — Encounter: Payer: Self-pay | Admitting: Obstetrics and Gynecology

## 2022-01-27 ENCOUNTER — Other Ambulatory Visit: Payer: Self-pay

## 2022-01-27 VITALS — BP 114/70 | Ht 65.0 in | Wt 149.4 lb

## 2022-01-27 DIAGNOSIS — B372 Candidiasis of skin and nail: Secondary | ICD-10-CM | POA: Diagnosis not present

## 2022-01-27 DIAGNOSIS — Z0000 Encounter for general adult medical examination without abnormal findings: Secondary | ICD-10-CM

## 2022-01-27 DIAGNOSIS — Z01419 Encounter for gynecological examination (general) (routine) without abnormal findings: Secondary | ICD-10-CM | POA: Diagnosis not present

## 2022-01-27 DIAGNOSIS — Z9189 Other specified personal risk factors, not elsewhere classified: Secondary | ICD-10-CM

## 2022-01-27 DIAGNOSIS — Z1231 Encounter for screening mammogram for malignant neoplasm of breast: Secondary | ICD-10-CM | POA: Diagnosis not present

## 2022-01-27 MED ORDER — NYSTATIN-TRIAMCINOLONE 100000-0.1 UNIT/GM-% EX OINT: 1.0000 "application " | TOPICAL_OINTMENT | Freq: Two times a day (BID) | CUTANEOUS | 0 refills | Status: DC

## 2022-01-27 NOTE — Patient Instructions (Signed)
[facility] [place] for Calcium and Vitamin D  Age (yr) Calcium Recommended Dietary Allowance (mg/day) Vitamin D Recommended Dietary Allowance (international units/day)  9-18 1,300 600  19-50 1,000 600  51-70 1,200 600  71 and older 1,200 800  Data from Institute of Medicine. Dietary reference intakes: calcium, vitamin D. [place], [place]: [employer]; 2011.   Exercising to Stay Healthy To become healthy and stay healthy, it is recommended that you do moderate-intensity and vigorous-intensity exercise. You can tell that you are exercising at a moderate intensity if your heart starts beating faster and you start breathing faster but can still hold a conversation. You can tell that you are exercising at a vigorous intensity if you are breathing much harder and faster and cannot hold a conversation while exercising. How can exercise benefit me? Exercising regularly is important. It has many health benefits, such as: Improving overall fitness, flexibility, and endurance. Increasing bone density. Helping with weight control. Decreasing body fat. Increasing muscle strength and endurance. Reducing stress and tension, anxiety, depression, or anger. Improving overall health. What guidelines should I follow while exercising? Before you start a new exercise program, talk with your health care provider. Do not exercise so much that you hurt yourself, feel dizzy, or get very short of breath. Wear comfortable clothes and wear shoes with good support. Drink plenty of water while you exercise to prevent dehydration or heat stroke. Work out until your breathing and your heartbeat get faster (moderate intensity). How often should I exercise? Choose an activity that you enjoy, and set realistic goals. Your health care provider can help you make an activity plan that is individually designed and works best for you. Exercise regularly as told by your health  care provider. This may include: Doing strength training two times a week, such as: Lifting weights. Using resistance bands. Push-ups. Sit-ups. Yoga. Doing a certain intensity of exercise for a given amount of time. Choose from these options: A total of 150 minutes of moderate-intensity exercise every week. A total of 75 minutes of vigorous-intensity exercise every week. A mix of moderate-intensity and vigorous-intensity exercise every week. Children, pregnant women, people who have not exercised regularly, people who are overweight, and older adults may need to talk with a health care provider about what activities are safe to perform. If you have a medical condition, be sure to talk with your health care provider before you start a new exercise program. What are some exercise ideas? Moderate-intensity exercise ideas include: Walking 1 mile (1.6 km) in about 15 minutes. Biking. Hiking. Golfing. Dancing. Water aerobics. Vigorous-intensity exercise ideas include: Walking 4.5 miles (7.2 km) or more in about 1 hour. Jogging or running 5 miles (8 km) in about 1 hour. Biking 10 miles (16.1 km) or more in about 1 hour. Lap swimming. Roller-skating or in-line skating. Cross-country skiing. Vigorous competitive sports, such as football, basketball, and soccer. Jumping rope. Aerobic dancing. What are some everyday activities that can help me get exercise? Yard work, such as: Psychologist, educational. Raking and bagging leaves. Washing your car. Pushing a stroller. Shoveling snow. Gardening. Washing windows or floors. How can I be more active in my day-to-day activities? Use stairs instead of an elevator. Take a walk during your lunch break. If you drive, park your car farther away from your work or school. If you take public transportation, get off one stop early and walk the rest of the way. Stand up or walk around during all of  your indoor phone calls. Get up, stretch, and walk  around every 30 minutes throughout the day. Enjoy exercise with a friend. Support to continue exercising will help you keep a regular routine of activity. Where to find more information You can find more information about exercising to stay healthy from: U.S. Department of Health and Human Services: [employer].at Centers for Disease Control and Prevention (CDC): http://www.wolf.info/ Summary Exercising regularly is important. It will improve your overall fitness, flexibility, and endurance. Regular exercise will also improve your overall health. It can help you control your weight, reduce stress, and improve your bone density. Do not exercise so much that you hurt yourself, feel dizzy, or get very short of breath. Before you start a new exercise program, talk with your health care provider. This information is not intended to replace advice given to you by your health care provider. Make sure you discuss any questions you have with your health care provider. Document Revised: 04/05/2021 Document Reviewed: 04/05/2021 Elsevier Patient Education  2022 [place] Eating There are many ways to save money at the grocery store and continue to eat healthy. You can be successful if you: Plan meals according to your budget. Make a grocery list and only purchase food according to your grocery list. Prepare food yourself at home. What are tips for following this plan? Reading food labels Compare food labels between brand name foods and the store brand. Often the nutritional value is the same, but the store brand is lower cost. Look for products that do not have added sugar, fat, or salt (sodium). These often cost the same but are healthier for you. Products may be labeled as: Sugar-free. Nonfat. Low-fat. Sodium-free. Low-sodium. Look for lean ground beef labeled as at least 92% lean and 8% fat. Shopping  Buy only the items on your grocery list and go only to the areas of the store  that have the items on your list. Use coupons only for foods and brands you normally buy. Avoid buying items you wouldn't normally buy simply because they are on sale. Check online and in newspapers for weekly deals. Buy healthy items from the bulk bins when available, such as herbs, spices, flour, pasta, nuts, and dried fruit. Buy fruits and vegetables that are in season. Prices are usually lower on in-season produce. Look at the unit price on the price tag. Use it to compare different brands and sizes to find out which item is the best deal. Choose healthy items that are often low-cost, such as carrots, potatoes, apples, bananas, and oranges. Dried or canned beans are a low-cost protein source. Buy in bulk and freeze extra food. Items you can buy in bulk include meats, fish, poultry, frozen fruits, and frozen vegetables. Avoid buying "ready-to-eat" foods, such as pre-cut fruits and vegetables and pre-made salads. If possible, shop around to discover where you can find the best prices. Consider other retailers such as dollar stores, larger [employer], local fruit and vegetable stands, and farmers markets. Do not shop when you are hungry. If you shop while hungry, it may be hard to stick to your list and budget. Resist impulse buying. Use your grocery list as your official plan for the week. Buy a variety of vegetables and fruits by purchasing fresh, frozen, and canned items. Look at the top and bottom shelves for deals. Foods at eye level (eye level of an adult or child) are usually more expensive. Be efficient with your time when shopping. The more time you  spend at the store, the more money you are likely to spend. To save money when choosing more expensive foods like meats and dairy: Choose cheaper cuts of meat, such as bone-in chicken thighs and drumsticks instead of skinless and boneless chicken. When you are ready to prepare the chicken, you can remove the skin yourself to make it  healthier. Choose lean meats like chicken or [place] instead of beef. Choose canned seafood, such as tuna, salmon, or sardines. Buy eggs as a low-cost source of protein. Buy dried beans and peas, such as lentils, split peas, or kidney beans instead of meats. Dried beans and peas are a good alternative source of protein. Buy the larger tubs of yogurt instead of individual-sized containers. Choose water instead of sodas and other sweetened beverages. Avoid buying chips, cookies, and other "junk food." These items are usually expensive and not healthy. Cooking Make extra food and freeze the extras in meal-sized containers or in individual portions for fast meals and snacks. Pre-cook on days when you have extra time to prepare meals in advance. You can keep these meals in the fridge or freezer and reheat for a quick meal. When you come home from the grocery store, wash, peel, and cut fruits and vegetables so they are ready to use and eat. This will help reduce food waste. Meal planning Do not eat out or get fast food. Prepare food at home. Make a grocery list and make sure to bring it with you to the store. If you have a smart phone, you could use your phone to create your shopping list. Plan meals and snacks according to a grocery list and budget you create. Use leftovers in your meal plan for the week. Look for recipes where you can cook once and make enough food for two meals. Prepare budget-friendly types of meals like stews, casseroles, and stir-fry dishes. Try some meatless meals or try "no cook" meals like salads. Make sure that half your plate is filled with fruits or vegetables. Choose from fresh, frozen, or canned fruits and vegetables. If eating canned, remember to rinse them before eating. This will remove any excess salt added for packaging. Summary Eating healthy on a budget is possible if you plan your meals according to your budget, purchase according to your budget and grocery list,  and prepare food yourself. Tips for buying more food on a limited budget include buying generic brands, using coupons only for foods you normally buy, and buying healthy items from the bulk bins when available. Tips for buying cheaper food to replace expensive food include choosing cheaper, lean cuts of meat, and buying dried beans and peas. This information is not intended to replace advice given to you by your health care provider. Make sure you discuss any questions you have with your health care provider. Document Revised: 09/20/2020 Document Reviewed: 09/20/2020 Elsevier Patient Education  [place] protect organs, store calcium, anchor muscles, and support the whole body. Keeping your bones strong is important, especially as you get older. You can take actions to help keep your bones strong and healthy. Why is keeping my bones healthy important? Keeping your bones healthy is important because your body constantly replaces bone cells. Cells get old, and new cells take their place. As we age, we lose bone cells because the body may not be able to make enough new cells to replace the old cells. The amount of bone cells and bone tissue you have is referred to as  bone mass. The higher your bone mass, the stronger your bones. The aging process leads to an overall loss of bone mass in the body, which can increase the likelihood of: Broken bones. A condition in which the bones become weak and brittle (osteoporosis). A large decline in bone mass occurs in older adults. In women, it occurs about the time of menopause. What actions can I take to keep my bones healthy? Good health habits are important for maintaining healthy bones. This includes eating nutritious foods and exercising regularly. To have healthy bones, you need to get enough of the right minerals and vitamins. Most nutrition experts recommend getting these nutrients from the foods that you eat. In some cases,  taking supplements may also be recommended. Doing certain types of exercise is also important for bone health. What are the nutritional recommendations for healthy bones? Eating a well-balanced diet with plenty of calcium and vitamin D will help to protect your bones. Nutritional recommendations vary from person to person. Ask your health care provider what is healthy for you. Here are some general guidelines. Get enough calcium Calcium is the most important (essential) mineral for bone health. Most people can get enough calcium from their diet, but supplements may be recommended for people who are at risk for osteoporosis. Good sources of calcium include: Dairy products, such as low-fat or nonfat milk, cheese, and yogurt. Dark green leafy vegetables, such as bok choy and broccoli. Foods that have calcium added to them (are fortified). Foods that may be fortified with calcium include orange juice, cereal, bread, soy beverages, and tofu products. Nuts, such as almonds. Follow these recommended amounts for daily calcium intake: Infants, 0-6 months: 200 mg. Infants, 6-12 months: 260 mg. Children, age 60-3: 700 mg. Children, age 53-8: 1,000 mg. Children, age 75-13: 1,300 mg. Teens, age 54-18: 1,300 mg. Adults, age 74-50: 1,000 mg. Adults, age 602-70: Men: 1,000 mg. Women: 1,200 mg. Adults, age 27 or older: 1,200 mg. Pregnant and breastfeeding females: Teens: 1,300 mg. Adults: 1,000 mg. Get enough vitamin D Vitamin D is the most essential vitamin for bone health. It helps the body absorb calcium. Sunlight stimulates the skin to make vitamin D, so be sure to get enough sunlight. If you live in a cold climate or you do not get outside often, your health care provider may recommend that you take vitamin D supplements. Good sources of vitamin D in your diet include: Egg yolks. Saltwater fish. Milk and cereal fortified with vitamin D. Follow these recommended amounts for daily vitamin D  intake: Infants, 0-12 months: 400 international units (IU). Children and teens, age 60-18: 6 international units. Adults, age 53 or younger: 18 international units. Adults, age 54 or older: 79-1,000 international units. Get other important nutrients Other nutrients that are important for bone health include: Phosphorus. This mineral is found in meat, poultry, dairy foods, nuts, and legumes. The recommended daily intake for adult men and adult women is 700 mg. Magnesium. This mineral is found in seeds, nuts, dark green vegetables, and legumes. The recommended daily intake for adult men is 400-420 mg. For adult women, it is 310-320 mg. Vitamin K. This vitamin is found in green leafy vegetables. The recommended daily intake is 120 mcg for adult men and 90 mcg for adult women. What type of physical activity is best for building and maintaining healthy bones? Weight-bearing and strength-building activities are important for building and maintaining healthy bones. Weight-bearing activities cause muscles and bones to work against gravity. Strength-building activities  increase the strength of the muscles that support bones. Weight-bearing and muscle-building activities include: Walking and hiking. Jogging and running. Dancing. Gym exercises. Lifting weights. Tennis and racquetball. Climbing stairs. Aerobics. Adults should get at least 30 minutes of moderate physical activity on most days. Children should get at least 60 minutes of moderate physical activity on most days. Ask your health care provider what type of exercise is best for you. How can I find out if my bone mass is low? Bone mass can be measured with an X-ray test called a bone mineral density (BMD) test. This test is recommended for all women who are age 1 or older. It may also be recommended for: Men who are age 74 or older. People who are at risk for osteoporosis because of: Having a long-term disease that weakens bones, such as  kidney disease or rheumatoid arthritis. Having menopause earlier than normal. Taking medicine that weakens bones, such as steroids, thyroid hormones, or hormone treatment for breast cancer or prostate cancer. Smoking. Drinking three or more alcoholic drinks a day. Being underweight. Sedentary lifestyle. If you find that you have a low bone mass, you may be able to prevent osteoporosis or further bone loss by changing your diet and lifestyle. Where can I find more information? Bone Health & Osteoporosis Foundation: [employer].fr [employer] of Health: www.bones.SouthExposed.es International Osteoporosis Foundation: Administrator.iofbonehealth.org Summary The aging process leads to an overall loss of bone mass in the body, which can increase the likelihood of broken bones and osteoporosis. Eating a well-balanced diet with plenty of calcium and vitamin D will help to protect your bones. Weight-bearing and strength-building activities are also important for building and maintaining strong bones. Bone mass can be measured with an X-ray test called a bone mineral density (BMD) test. This information is not intended to replace advice given to you by your health care provider. Make sure you discuss any questions you have with your health care provider. Document Revised: 05/22/2021 Document Reviewed: 05/22/2021 Elsevier Patient Education  2022 [place], Adult After being diagnosed with anxiety, you may be relieved to know why you have felt or behaved a certain way. You may also feel overwhelmed about the treatment ahead and what it will mean for your life. With care and support, you can manage this condition. How to manage lifestyle changes Managing stress and anxiety Stress is your body's reaction to life changes and events, both good and bad. Most stress will last just a few hours, but stress can be ongoing and can lead to more than just stress. Although stress can play a major  role in anxiety, it is not the same as anxiety. Stress is usually caused by something external, such as a deadline, test, or competition. Stress normally passes after the triggering event has ended.  Anxiety is caused by something internal, such as imagining a terrible outcome or worrying that something will go wrong that will devastate you. Anxiety often does not go away even after the triggering event is over, and it can become long-term (chronic) worry. It is important to understand the differences between stress and anxiety and to manage your stress effectively so that it does not lead to an anxious response. Talk with your health care provider or a counselor to learn more about reducing anxiety and stress. He or she may suggest tension reduction techniques, such as: Music therapy. Spend time creating or listening to music that you enjoy and that inspires you. Mindfulness-based meditation. Practice being aware of  your normal breaths while not trying to control your breathing. It can be done while sitting or walking. Centering prayer. This involves focusing on a word, phrase, or sacred image that means something to you and brings you peace. Deep breathing. To do this, expand your stomach and inhale slowly through your nose. Hold your breath for 3-5 seconds. Then exhale slowly, letting your stomach muscles relax. Self-talk. Learn to notice and identify thought patterns that lead to anxiety reactions and change those patterns to thoughts that feel peaceful. Muscle relaxation. Taking time to tense muscles and then relax them. Choose a tension reduction technique that fits your lifestyle and personality. These techniques take time and practice. Set aside 5-15 minutes a day to do them. Therapists can offer counseling and training in these techniques. The training to help with anxiety may be covered by some insurance plans. Other things you can do to manage stress and anxiety include: Keeping a stress diary.  This can help you learn what triggers your reaction and then learn ways to manage your response. Thinking about how you react to certain situations. You may not be able to control everything, but you can control your response. Making time for activities that help you relax and not feeling guilty about spending your time in this way. Doing visual imagery. This involves imagining or creating mental pictures to help you relax. Practicing yoga. Through yoga poses, you can lower tension and promote relaxation.  Medicines Medicines can help ease symptoms. Medicines for anxiety include: Antidepressant medicines. These are usually prescribed for long-term daily control. Anti-anxiety medicines. These may be added in severe cases, especially when panic attacks occur. Medicines will be prescribed by a health care provider. When used together, medicines, psychotherapy, and tension reduction techniques may be the most effective treatment. Relationships Relationships can play a big part in helping you recover. Try to spend more time connecting with trusted friends and family members. Consider going to couples counseling if you have a partner, taking family education classes, or going to family therapy. Therapy can help you and others better understand your condition. How to recognize changes in your anxiety Everyone responds differently to treatment for anxiety. Recovery from anxiety happens when symptoms decrease and stop interfering with your daily activities at home or work. This may mean that you will start to: Have better concentration and focus. Worry will interfere less in your daily thinking. Sleep better. Be less irritable. Have more energy. Have improved memory. It is also important to recognize when your condition is getting worse. Contact your health care provider if your symptoms interfere with home or work and you feel like your condition is not improving. Follow these instructions at  home: Activity Exercise. Adults should do the following: Exercise for at least 150 minutes each week. The exercise should increase your heart rate and make you sweat (moderate-intensity exercise). Strengthening exercises at least twice a week. Get the right amount and quality of sleep. Most adults need 7-9 hours of sleep each night. Lifestyle  Eat a healthy diet that includes plenty of vegetables, fruits, whole grains, low-fat dairy products, and lean protein. Do not eat a lot of foods that are high in fats, added sugars, or salt (sodium). Make choices that simplify your life. Do not use any products that contain nicotine or tobacco. These products include cigarettes, chewing tobacco, and vaping devices, such as e-cigarettes. If you need help quitting, ask your health care provider. Avoid caffeine, alcohol, and certain over-the-counter cold medicines. These may  make you feel worse. Ask your pharmacist which medicines to avoid. General instructions Take over-the-counter and prescription medicines only as told by your health care provider. Keep all follow-up visits. This is important. Where to find support You can get help and support from these sources: Self-help groups. Online and [employer]. A trusted spiritual leader. Couples counseling. Family education classes. Family therapy. Where to find more information You may find that joining a support group helps you deal with your anxiety. The following sources can help you locate counselors or support groups near you: [place]: www.mentalhealthamerica.net Anxiety and Depression Association of [place] (ADAA): https://www.clark.net/ National Alliance on Mental Illness (NAMI): www.nami.org Contact a health care provider if: You have a hard time staying focused or finishing daily tasks. You spend many hours a day feeling worried about everyday life. You become exhausted by worry. You start to have headaches or frequently  feel tense. You develop chronic nausea or diarrhea. Get help right away if: You have a racing heart and shortness of breath. You have thoughts of hurting yourself or others. If you ever feel like you may hurt yourself or others, or have thoughts about taking your own life, get help right away. Go to your nearest emergency department or: Call your local emergency services (911 in the [place] Call a suicide crisis helpline, such as the [place] at 682-021-0003 or 988 in the [place]. This is open 24 hours a day in the [place] Text the Crisis Text Line at (406) 818-8406 (in the [place].). Summary Taking steps to learn and use tension reduction techniques can help calm you and help prevent triggering an anxiety reaction. When used together, medicines, psychotherapy, and tension reduction techniques may be the most effective treatment. Family, friends, and partners can play a big part in supporting you. This information is not intended to replace advice given to you by your health care provider. Make sure you discuss any questions you have with your health care provider. Document Revised: 07/03/2021 Document Reviewed: 03/31/2021 Elsevier Patient Education  [place].

## 2022-01-27 NOTE — Progress Notes (Signed)
Gynecology Annual Exam  PCP: Juline Patch, MD  Chief Complaint:  Chief Complaint  Cheyenne Bean presents with   Gynecologic Exam    Annual Exam    History of Present Illness: Cheyenne Bean is a 66 y.o. G1P0010 presents for annual exam. The Cheyenne Bean has no complaints today.   LMP: No LMP recorded. Cheyenne Bean has had a hysterectomy. She denies postmenopausal bleeding or spotting  The Cheyenne Bean is sexually active. She denies dyspareunia.  Postcoital Bleeding: no   The Cheyenne Bean does perform self breast exams.  There is notable family history of breast or ovarian cancer in her family.  The Cheyenne Bean has regular exercise: none  The Cheyenne Bean reports current symptoms of anxiety. Her father was taken to the hospital today.   PHQ-9: 1 GAD-7: 9   Review of Systems: Review of Systems  Constitutional:  Negative for chills, fever, malaise/fatigue and weight loss.  HENT:  Negative for congestion, hearing loss and sinus pain.   Eyes:  Negative for blurred vision and double vision.  Respiratory:  Negative for cough, sputum production, shortness of breath and wheezing.   Cardiovascular:  Negative for chest pain, palpitations, orthopnea and leg swelling.  Gastrointestinal:  Negative for abdominal pain, constipation, diarrhea, nausea and vomiting.  Genitourinary:  Negative for dysuria, flank pain, frequency, hematuria and urgency.  Musculoskeletal:  Positive for back pain. Negative for falls and joint pain.  Skin:  Negative for itching and rash.  Neurological:  Negative for dizziness and headaches.  Psychiatric/Behavioral:  Negative for depression, substance abuse and suicidal ideas. The Cheyenne Bean is not nervous/anxious.    Past Medical History:  Past Medical History:  Diagnosis Date   BRCA negative 01/2017   MyRisk neg   Family history of breast cancer    1/18 IBIS=10.7%   Family history of ovarian cancer    MyRisk neg   Genital herpes    Hyperlipidemia    Hypertension    IBS (irritable bowel  syndrome) 02/20/2017   Sarcoidosis     Past Surgical History:  Past Surgical History:  Procedure Laterality Date   BACK SURGERY     BUNIONECTOMY Left    COLONOSCOPY  2012   cleared for 10 yrs   COLONOSCOPY WITH PROPOFOL N/A 02/27/2017   Procedure: COLONOSCOPY WITH PROPOFOL;  Surgeon: Lollie Sails, MD;  Location: [facility] ENDOSCOPY;  Service: Endoscopy;  Laterality: N/A;   VAGINAL HYSTERECTOMY      Gynecologic History:  No LMP recorded. Cheyenne Bean has had a hysterectomy. Last Pap: Results were: 2018- s/p hysterectomy  Last mammogram: 2022 Results were: BI-RAD I  Obstetric History: G1P0010  Family History:  Family History  Problem Relation Age of Onset   Hypertension Mother    Breast cancer Sister 42   Cervical cancer Sister 73   Ovarian cancer Maternal Aunt 60   Colon cancer Maternal Uncle 83    Social History:  Social History   Socioeconomic History   Marital status: Married    Spouse name: Not on file   Number of children: Not on file   Years of education: Not on file   Highest education level: Not on file  Occupational History   Not on file  Tobacco Use   Smoking status: Never   Smokeless tobacco: Never  Vaping Use   Vaping Use: Never used  Substance and Sexual Activity   Alcohol use: No    Alcohol/week: 0.0 standard drinks   Drug use: No   Sexual activity: Yes  Other Topics Concern  Not on file  Social History Narrative   Not on file   Social Determinants of Health   Financial Resource Strain: Not on file  Food Insecurity: Not on file  Transportation Needs: Not on file  Physical Activity: Not on file  Stress: Not on file  Social Connections: Not on file  Intimate Partner Violence: Not on file    Allergies:  No Known Allergies  Medications: Prior to Admission medications   Medication Sig Start Date End Date Taking? Authorizing Provider  diclofenac sodium (VOLTAREN) 1 % GEL Apply 2 g topically 4 (four) times daily. 08/05/19  Yes Juline Patch, MD  ibuprofen (ADVIL) 600 MG tablet Take 1 tablet (600 mg total) by mouth every 8 (eight) hours as needed. 04/27/21  Yes Jearld Fenton, NP  meclizine (ANTIVERT) 25 MG tablet Take 1 tablet (25 mg total) by mouth as needed for dizziness. 10/17/21  Yes Juline Patch, MD  pravastatin (PRAVACHOL) 20 MG tablet Take 1 tablet (20 mg total) by mouth daily. 10/14/21  Yes Juline Patch, MD  valACYclovir (VALTREX) 500 MG tablet Take 1 tablet by mouth once daily 01/06/22  Yes Juline Patch, MD  amLODipine (NORVASC) 5 MG tablet Take 1.5 tablets by mouth daily. Take 1.5 tablets daily= 7.18m/ KNehemiah Massed6/29/21 12/27/21  [provider]    Physical Exam Vitals: Blood pressure 114/70, height _0  (1.651 m), weight 149 lb 6.4 oz (67.8 kg).  Physical Exam Constitutional:      Appearance: She is well-developed.  Genitourinary:     Genitourinary Comments: External: Normal appearing vulva. No lesions noted.   Bimanual examination: Uterus ABSENT. No adnexal masses. No adnexal tenderness. Pelvis not fixed.  Breast Exam: breast equal without nipple discharge, breast lump or enlarged lymph nodes   HENT:     Head: Normocephalic and atraumatic.  Neck:     Thyroid: No thyromegaly.  Cardiovascular:     Rate and Rhythm: Normal rate and regular rhythm.     Heart sounds: Normal heart sounds.  Pulmonary:     Effort: Pulmonary effort is normal.     Breath sounds: Normal breath sounds.  Abdominal:     General: Bowel sounds are normal. There is no distension.     Palpations: Abdomen is soft. There is no mass.  Musculoskeletal:     Cervical back: Neck supple.  Neurological:     Mental Status: She is alert and oriented to person, place, and time.  Skin:    General: Skin is warm and dry.       Psychiatric:        Behavior: Behavior normal.        Thought Content: Thought content normal.        Judgment: Judgment normal.  Vitals reviewed.     Female chaperone present for pelvic and breast   portions of the physical exam  Assessment: 66y.o. G1P0010 routine annual exam  Plan: Problem List Items Addressed This Visit   None Visit Diagnoses     Encounter for annual routine gynecological examination    -  Primary   Skin yeast infection       Relevant Medications   nystatin-triamcinolone ointment (MYCOLOG)   At risk for obstructive sleep apnea       Relevant Orders   Ambulatory referral to Sleep Studies   Breast cancer screening by mammogram       Relevant Orders   MM 3D SCREEN BREAST BILATERAL  1) Mammogram - recommend yearly screening mammogram.  Mammogram  was ordered today. Discussed option for abbreviated MRI through [facility] given her concerns with dense breast and family history  2) STI screening was offered and declined  3) Pap smears can be discontinued  4) Colonoscopy -- up to date  2021 per Cheyenne Bean, 2018 record seen  5) Routine healthcare maintenance including cholesterol, diabetes screening discussed managed by PCP  6) Osteoporosis screening - no increased risk  7) skin rash on right breast- appears to be a skin yeast infection- trial topical nystatin.  Does not appear to have characteristics of inflammatory breast cancer. No erythema. No induration. Denies nipple discharge.   8) Will refer for sleep apnea screening.   Do you snore loudly? ( louder than talking or loud enough to be heard through closed doors?) YES Do you often feel tired, fatigued, or sleepy during daytime? YES Has anyone observed you stop breathing during sleep? NO Do you have or are you being treated for high blood pressure? YES BMI> 35 NO Age> 50 YES Neck circumference > 40 cm NO Female gender? NO ** if yes to > 3 questions high risk of obstructive sleep apnea ** if yes to <3 questions+ low risk for obstructive sleep apnea   Adrian Prows MD, [facility] 01/27/2022 2:42 PM

## 2022-02-01 ENCOUNTER — Other Ambulatory Visit: Payer: Self-pay | Admitting: Family Medicine

## 2022-02-01 DIAGNOSIS — E7800 Pure hypercholesterolemia, unspecified: Secondary | ICD-10-CM

## 2022-02-02 ENCOUNTER — Ambulatory Visit
Admission: EM | Admit: 2022-02-02 | Discharge: 2022-02-02 | Disposition: A | Discharge status: Home / Self Care | Payer: Medicaid Other | Attending: Emergency Medicine | Admitting: Emergency Medicine

## 2022-02-02 ENCOUNTER — Other Ambulatory Visit: Payer: Self-pay

## 2022-02-02 DIAGNOSIS — Z20822 Contact with and (suspected) exposure to covid-19: Secondary | ICD-10-CM

## 2022-02-02 DIAGNOSIS — J029 Acute pharyngitis, unspecified: Secondary | ICD-10-CM | POA: Diagnosis not present

## 2022-02-02 LAB — GROUP A STREP BY PCR: Group A Strep by PCR: NOT DETECTED

## 2022-02-02 LAB — RESP PANEL BY RT-PCR (FLU A&B, COVID) ARPGX2
Influenza A by PCR: NEGATIVE
Influenza B by PCR: NEGATIVE
SARS Coronavirus 2 by RT PCR: NEGATIVE

## 2022-02-02 MED ORDER — FLUTICASONE PROPIONATE 50 MCG/ACT NA SUSP: 2.0000 | Freq: Every day | NASAL | 0 refills | Status: DC

## 2022-02-02 MED ORDER — IBUPROFEN 600 MG PO TABS: 600.0000 mg | ORAL_TABLET | Freq: Four times a day (QID) | ORAL | 0 refills | Status: AC | PRN

## 2022-02-02 NOTE — Discharge Instructions (Addendum)
Your strep, COVID and flu PCR pending.  I will prescribe amoxicillin if your strep is positive, and the appropriate antiviral if your COVID or flu are positive.  Flonase, saline nasal irrigation with a Lloyd Huger Med rinse and distilled water as often as you want, plain Mucinex for the nasal congestion.  1 gram of Tylenol and 600 mg ibuprofen together 3-4 times a day as needed for pain.  Make sure you drink plenty of extra fluids.  Some people find salt water gargles and  Traditional Medicinal's "Throat Coat" tea helpful. Take 5 mL of liquid Benadryl and 5 mL of Maalox. Mix it together, and then hold it in your mouth for as long as you can and then swallow. You may do this 4 times a day.    Go to www.goodrx.com  or www.costplusdrugs.com to look up your medications. This will give you a list of where you can find your prescriptions at the most affordable prices. Or ask the pharmacist what the cash price is, or if they have any other discount programs available to help make your medication more affordable. This can be less expensive than what you would pay with insurance.

## 2022-02-02 NOTE — ED Provider Notes (Signed)
HPI  SUBJECTIVE:  Patient reports sore throat starting 4 days ago.  sx worse with swallowing, talk.  Sx better with nothing.  Has tried teas, 600 mg of ibuprofen, Coricidin and popsicles without improvement + Tactile fevers, states that she has felt hot to the touch, but no documented fever + Swollen neck glands   No neck stiffness  + Cough + nasal congestion, rhinorrhea + Myalgias + Headache No Rash  No loss of taste or smell No shortness of breath or difficulty breathing No nausea, vomiting No diarrhea No abdominal pain     No Recent Strep, mono,, flu COVID exposure, although she has a family member in the house with cough She got 2 doses of the COVID-vaccine and this years flu vaccine  No reflux sxs No Allergy sxs  No Breathing difficulty, voice changes, sensation of throat swelling shut No Drooling No Trismus No abx in past month.   + Ibuprofen within the past 6 hours. Past medical history of sarcoidosis, hypertension.  No history of COVID. PMD: [facility]   Past Medical History:  Diagnosis Date   BRCA negative 01/2017   MyRisk neg   Family history of breast cancer    1/18 IBIS=10.7%   Family history of ovarian cancer    MyRisk neg   Genital herpes    Hyperlipidemia    Hypertension    IBS (irritable bowel syndrome) 02/20/2017   Sarcoidosis     Past Surgical History:  Procedure Laterality Date   BACK SURGERY     BUNIONECTOMY Left    COLONOSCOPY  2012   cleared for 10 yrs   COLONOSCOPY WITH PROPOFOL N/A 02/27/2017   Procedure: COLONOSCOPY WITH PROPOFOL;  Surgeon: Lollie Sails, MD;  Location: [facility] ENDOSCOPY;  Service: Endoscopy;  Laterality: N/A;   VAGINAL HYSTERECTOMY      Family History  Problem Relation Age of Onset   Hypertension Mother    Breast cancer Sister 17   Cervical cancer Sister 7   Ovarian cancer Maternal Aunt 60   Colon cancer Maternal Uncle 56    Social History   Tobacco Use   Smoking status: Never   Smokeless  tobacco: Never  Vaping Use   Vaping Use: Never used  Substance Use Topics   Alcohol use: No    Alcohol/week: 0.0 standard drinks   Drug use: No    No current facility-administered medications for this encounter.  Current Outpatient Medications:    fluticasone (FLONASE) 50 MCG/ACT nasal spray, Place 2 sprays into both nostrils daily., Disp: 16 g, Rfl: 0   ibuprofen (ADVIL) 600 MG tablet, Take 1 tablet (600 mg total) by mouth every 6 (six) hours as needed., Disp: 30 tablet, Rfl: 0   amLODipine (NORVASC) 5 MG tablet, Take 1.5 tablets by mouth daily. Take 1.5 tablets daily= 7.54m/ KNehemiah Massed Disp: , Rfl:    meclizine (ANTIVERT) 25 MG tablet, Take 1 tablet (25 mg total) by mouth as needed for dizziness., Disp: 30 tablet, Rfl: 0   pravastatin (PRAVACHOL) 20 MG tablet, Take 1 tablet (20 mg total) by mouth daily., Disp: 90 tablet, Rfl: 1   valACYclovir (VALTREX) 500 MG tablet, Take 1 tablet by mouth once daily, Disp: 30 tablet, Rfl: 0  No Known Allergies   ROS  As noted in HPI.   Physical Exam  BP 132/85 (BP Location: Right Arm)    Pulse 77    Temp 98.3 F (36.8 C) (Oral)    Resp 17    Ht 5'  5" (1.651 m)    Wt 66.7 kg    SpO2 96%    BMI 24.46 kg/m   Constitutional: Well developed, well nourished, no acute distress Eyes:  EOMI, conjunctiva normal bilaterally HENT: Normocephalic, atraumatic,mucus membranes moist.  Positive nasal congestion, erythematous, swollen turbinates with near occlusion on the left side.  No maxillary, frontal sinus tenderness.  Erythematous oropharynx with slightly swollen tonsils.  No exudates.  Uvula midline.  Uvula midline.  Respiratory: Normal inspiratory effort, lungs clear bilaterally Cardiovascular: Normal rate, no murmurs, rubs, gallops GI: nondistended, nontender. No appreciable splenomegaly skin: No rash, skin intact Lymph: Positive anterior cervical LN.  No posterior cervical lymphadenopathy Musculoskeletal: no deformities Neurologic: Alert & oriented  x 3, no focal neuro deficits Psychiatric: Speech and behavior appropriate.    ED Course   Medications - No data to display  Orders Placed This Encounter  Procedures   Resp Panel by RT-PCR (Flu A&B, Covid) Nasopharyngeal Swab    Standing Status:   Standing    Number of Occurrences:   1   Group A Strep by PCR    Standing Status:   Standing    Number of Occurrences:   1   Airborne and Contact precautions    Standing Status:   Standing    Number of Occurrences:   1    Results for orders placed or performed during the hospital encounter of 02/02/22 (from the past 24 hour(s))  Resp Panel by RT-PCR (Flu A&B, Covid) Nasopharyngeal Swab     Status: None   Collection Time: 02/02/22  9:40 AM   Specimen: Nasopharyngeal Swab; Nasopharyngeal(NP) swabs in vial transport medium  Result Value Ref Range   SARS Coronavirus 2 by RT PCR NEGATIVE NEGATIVE   Influenza A by PCR NEGATIVE NEGATIVE   Influenza B by PCR NEGATIVE NEGATIVE  Group A Strep by PCR     Status: None   Collection Time: 02/02/22  9:41 AM   Specimen: Throat; Sterile Swab  Result Value Ref Range   Group A Strep by PCR NOT DETECTED NOT DETECTED   No results found.  ED Clinical Impression  1. Pharyngitis, unspecified etiology   2. Encounter for laboratory testing for COVID-19 virus      ED Assessment/Plan  Strep, flu, COVID PCR.  will contact patient at [phone] if any of these are positive.  She also has MyChart.  Strep, COVID, influenza PCR negative.  Patient with URI.  In the meantime, home with Flonase, saline nasal irrigation, Mucinex, ibuprofen, Tylenol, Benadryl/Maalox mixture. Patient to followup with PMD when necessary.  Discussed labs,  MDM, plan and followup with patient. Discussed sn/sx that should prompt return to the ED. patient agrees with plan.   Meds ordered this encounter  Medications   fluticasone (FLONASE) 50 MCG/ACT nasal spray    Sig: Place 2 sprays into both nostrils daily.    Dispense:   16 g    Refill:  0   ibuprofen (ADVIL) 600 MG tablet    Sig: Take 1 tablet (600 mg total) by mouth every 6 (six) hours as needed.    Dispense:  30 tablet    Refill:  0     *This clinic note was created using Lobbyist. Therefore, there may be occasional mistakes despite careful proofreading.     Melynda Ripple, MD 02/02/22 1053

## 2022-02-02 NOTE — ED Triage Notes (Signed)
Pt started Wednesday night with fever and sore throat. Endorses some congestion. States throat hurts so bad, hard to eat. Negative COVID test on Friday

## 2022-02-03 ENCOUNTER — Ambulatory Visit (INDEPENDENT_AMBULATORY_CARE_PROVIDER_SITE_OTHER): Payer: Medicaid Other | Admitting: Family Medicine

## 2022-02-03 ENCOUNTER — Encounter: Payer: Self-pay | Admitting: Family Medicine

## 2022-02-03 NOTE — Progress Notes (Signed)
Pt not seen in office- was in urgent care yesterday and was tested for strep and covid. They were negative and she was advised to pick up otc meds. Pt has not picked those up and wanted a "quick fix." I advised her to pick up the otc and give it a chance. She also c/o tinnitus in which I offered to call and get her back in with neuro- she declined at this time.

## 2022-02-05 ENCOUNTER — Other Ambulatory Visit: Payer: Self-pay

## 2022-02-05 ENCOUNTER — Telehealth: Payer: Self-pay

## 2022-02-05 DIAGNOSIS — H9319 Tinnitus, unspecified ear: Secondary | ICD-10-CM

## 2022-02-05 NOTE — Progress Notes (Signed)
Placed ref to ENT 

## 2022-02-05 NOTE — Telephone Encounter (Signed)
Copied from CRM [id number]. Topic: Referral - Request for Referral >> Feb 05, 2022  9:02 AM Gaetana Michaelis A wrote: Has patient seen PCP for this complaint? Yes.   *If NO, is insurance requiring patient see PCP for this issue before PCP can refer them? Referral for which specialty: Otolaryngologist  Preferred provider/office:Dr. Vernie Murders, MD Reason for referral: Ear congestions and discomfort

## 2022-02-05 NOTE — Telephone Encounter (Signed)
Pt called in requesting a referral to ENT- referral was placed

## 2022-02-19 ENCOUNTER — Other Ambulatory Visit: Payer: Self-pay | Admitting: Family Medicine

## 2022-02-19 DIAGNOSIS — A6000 Herpesviral infection of urogenital system, unspecified: Secondary | ICD-10-CM

## 2022-02-21 ENCOUNTER — Other Ambulatory Visit: Payer: Self-pay | Admitting: Obstetrics and Gynecology

## 2022-02-21 ENCOUNTER — Telehealth: Payer: Self-pay

## 2022-02-21 DIAGNOSIS — N771 Vaginitis, vulvitis and vulvovaginitis in diseases classified elsewhere: Secondary | ICD-10-CM

## 2022-02-21 DIAGNOSIS — B372 Candidiasis of skin and nail: Secondary | ICD-10-CM

## 2022-02-21 MED ORDER — FLUCONAZOLE 150 MG PO TABS: 150.0000 mg | ORAL_TABLET | ORAL | 0 refills | Status: AC

## 2022-02-21 NOTE — Telephone Encounter (Signed)
Pt calling; has a yeast inf; is on amoxicillin from another provider; knows she doesn't really need to come in; just needs rx.  [phone] ?

## 2022-02-21 NOTE — Telephone Encounter (Signed)
Sent rx to pharmacy

## 2022-02-21 NOTE — Progress Notes (Signed)
Diflucan rx for yeast infection ?

## 2022-02-24 NOTE — Telephone Encounter (Signed)
Pt aware.

## 2022-04-01 ENCOUNTER — Telehealth: Payer: Self-pay | Admitting: Family Medicine

## 2022-04-01 NOTE — Telephone Encounter (Signed)
Patient wanted to inform PCP [employer] will be reaching out to request an order for pull ups and chux pads. [employer] company # is [phone]. Please note when order has been sent.  ?

## 2022-04-03 ENCOUNTER — Other Ambulatory Visit: Payer: Self-pay | Admitting: Family Medicine

## 2022-04-03 DIAGNOSIS — A6000 Herpesviral infection of urogenital system, unspecified: Secondary | ICD-10-CM

## 2022-05-02 ENCOUNTER — Ambulatory Visit: Payer: Medicare Other | Attending: Orthopedic Surgery

## 2022-05-02 DIAGNOSIS — M79672 Pain in left foot: Secondary | ICD-10-CM | POA: Diagnosis not present

## 2022-05-02 NOTE — Therapy (Signed)
?OUTPATIENT PHYSICAL THERAPY FOOT/ANKLE EVALUATION ? ? ?Patient Name: Cheyenne Bean ?MRN: [id number] ?DOB:Jul 15, 1956, 66 y.o., female ?Today's Date: 05/02/2022 ? ? PT End of Session - 05/02/22 0912   ? ? Visit Number 1   ? Number of Visits 5   ? Date for PT Re-Evaluation 06/27/22   ? Authorization Type eval: 05/02/22   ? PT Start Time [phone]   ? PT Stop Time 0930   ? PT Time Calculation (min) 40 min   ? Activity Tolerance Patient tolerated treatment well   ? Behavior During Therapy [facility] for tasks assessed/performed   ? ?  ?  ? ?  ? ? ?Past Medical History:  ?Diagnosis Date  ? BRCA negative 01/2017  ? MyRisk neg  ? Family history of breast cancer   ? 1/18 IBIS=10.7%  ? Family history of ovarian cancer   ? MyRisk neg  ? Genital herpes   ? Hyperlipidemia   ? Hypertension   ? IBS (irritable bowel syndrome) 02/20/2017  ? Sarcoidosis   ? ?Past Surgical History:  ?Procedure Laterality Date  ? BACK SURGERY    ? BUNIONECTOMY Left   ? COLONOSCOPY  2012  ? cleared for 10 yrs  ? COLONOSCOPY WITH PROPOFOL N/A 02/27/2017  ? Procedure: COLONOSCOPY WITH PROPOFOL;  Surgeon: Lollie Sails, MD;  Location: [facility] ENDOSCOPY;  Service: Endoscopy;  Laterality: N/A;  ? VAGINAL HYSTERECTOMY    ? ?Patient Active Problem List  ? Diagnosis Date Noted  ? Pure hypercholesterolemia 11/04/2016  ? Essential hypertension 09/18/2015  ? Hyperlipidemia 09/18/2015  ? Recurrent genital herpes simplex 09/18/2015  ? ? ?PCP: Juline Patch, MD ? ?REFERRING PROVIDER: Lear Ng* ? ?REFERRING DIAGNOSIS: 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, left lower limb, M21.42 (ICD-10-CM) - Flat foot (pes planus) (acquired), left foot, M76.829 (ICD-10-CM) - Posterior tibial tendinitis, unspecified leg ? ?THERAPY DIAG: Pain in left foot ? ?ONSET DATE: 03/05/22 ? ?FOLLOW UP APPT WITH PROVIDER: Yes, pt reports she has a follow-up appointment in 3 months ? ?Precautions: None ? ?Weight Bearing Restrictions: No ? ? ? ?SUBJECTIVE:                                                                                                                                                                                         ? ?Chief Complaint: L foot pain ? ?Pertinent History ?Pt underwent L Morton's neuroma surgery on March 15th 2023. She presents today due to ongoing numbness on left forefoot, scar sensitivity, and navicular pain near poterior tibial insertion for the last 3 weeks. She was in a post-op shoe immediately after surgery but denies any WB restrictions.  Pt has transitioned back to sneakers but states she hasn't been wearing her orthotics because she feels like they made her foot "too high" and they felt uncomfortable. She has not been performing her home exercises for her posterior tibial tendinitis since last discharge from therapy and did not restart when her symptoms recurred. She has been performing scar massage at home since her incision healed. Prior history from 01/15/21: Pt reports that approximately 1 year ago she started having L foot pain. She was wearing a lot of flats around that time when the pain started started. The pain improved after approximately 1 week without any intervention. She was visiting her dad in October 2021 and her foot pain flared up significantly again. She was unable ot put weight on her foot due to the pain and had to use crutches to help her walk. She went to the urgent care and was sent to general orthopedics who recommended orthotics and gave her two steroid shots on her medial foot near her L posterior tibial tendon. She reports improvement for 1-2 days and then the pain returned. After returning to [place] she saw general orthopedics who placed a medial heel wedge in her shoes. He also prescribed her Voltaren gel for topical anti-inflammatory use to be used up to 4 times/day. 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 The pain is located on the medial aspect of her L foot at the insertion on the medial navicular  and she describes it as "achy." No other recent changes in medication or health. History of chronic back pain with LLE radicular pain to the calf. Pt reports she has had back surgery without improvement but does not recall what type of surgery she had. Pt has a history of sarcoidosis but states that she had a breathing test yesterday which was good.  ? ?Pain:  ?Pain Intensity: Present: 0/10, Best: 0/10, Worst: 6/10 ?Pain location: Dorsal aspect of L forefoot between digits 3-4 over incision scar and pain at insertion site of posterior tibialis tendon ?Pain quality: aching  ?Radiating pain: No  ?Swelling: No, initially swelling in the left ankle and lateral aspect of L foot after surgery but has resolved ?Numbness/Tingling: Yes, forefoot on dorsal and plantar aspects of digits 3-4 as well as plantar aspect over met heads of digits 3-4; ?Focal weakness or buckling: No ?Aggravating factors: tennis shoes with a narrow toe box, manual pressure over scar ?Relieving factors: rest and unweighting ?24-hour pain behavior: No change ?Follow-up appointment with MD: Yes, in 3 months ?Dominant hand: right ?Prior level of function: Independent ?Occupational demands: Not currently working ?Hobbies: making jewelry ?Red flags (chills/fever, night sweats, nausea, vomiting, unrelenting pain): Negative ?Patient Goals: Improve numbness and decrease pain over scar and at posterior tibial insertion ? ? ?OBJECTIVE:  ? ?Patient Surveys  ?FOTO 22, predicted improvement to 48 ? ?MUSCULOSKELETAL: ?Tremor: Absent ?Bulk: Normal ?Tone: Normal, ?No trophic changes noted to foot/ankle. No ecchymosis, erythema, or edema noted. No gross L ankle/foot deformity noted. Well healed incision between digits 3-4 on the forefoot dorsal aspect of the L foot ? ?Lumbar/Hip/Knee Screen ?Deferred ? ?Posture ?No gross abnormalities noted in seated posture. In sitting and standing posture pt with notable preservation of arch. She does tend to keep weight more on the  lateral aspect of her L foot due to pt reporting it "feels funny" on the underside of her left foot with weightbearing ? ?Gait ?Only mild pronation noted during gait bilaterally however not excessive and pt  is able to maintain her arch. Somewhat difficult to assess due to patient limiting weight shift to the medial aspect of the left foot because of altered sensation and it "feeling funny."  ? ?Palpation ?Pain to palpation at the insertion of the posterior tibial tendon on the navicular. She denies any pain with palpation proximally along the rest of the L posterior tibial tendon or anywhere further up the leg. No pain to palpation along medial and lateral malleoli. No pain with palpation of the rest of the midfoot. Achilles tendon intact and painless to palpation ? ?Strength ?R/L ?5/5 Hip flexion ?5/5 Hip abduction (seated) ?5/5 Hip adduction (seated) ?5/5 Knee extension ?5/5 Knee flexion (seated) ?5/5 Ankle Plantarflexion ?5/5 Ankle Dorsiflexion ?5/5 Ankle Inversion ?5/5 Ankle Eversion ?Decreased toe flexion strength in digits 3-5 of L foot ? ?AROM ?Deferred ? ?Passive Accessory Motion ?Deferred ? ?NEUROLOGICAL: ? ?Mental Status ?Patient is oriented to person, place and time.  ?Recent memory is intact.  ?Remote memory is intact.  ?Attention span and concentration are intact.  ?Expressive speech is intact.  ?Patient's fund of knowledge is within normal limits for educational level. ? ?Sensation ?Decreased sensation dorsal and plantar aspects of digits 4 and 5 on the left foot as well as over the met heads on the plantar surface of digits 4-5; ? ?Reflexes ?Deferred ? ?VASCULAR ?Deferred ? ?SPECIAL TESTS ?Deferred ? ? ?TODAY'S TREATMENT  ?None ? ? ?PATIENT EDUCATION:  ?Education details: Plan of care, HEP, scar massage, importance of HEP ?Person educated: Patient ?Education method: Explanation, video, handout ?Education comprehension: verbalized understanding ? ? ?HOME EXERCISE PROGRAM: ?Access Code: [email or web address] ?URL:  [email or web address] ?Date: 05/02/2022 ?Prepared by: Roxana Hires ? ?Exercises ?- Seated Arch Lifts  - 2 x daily - 7 x weekly - 2 sets - 10 reps - 5s hold ?- Seated Toe Towel Scrunches

## 2022-05-14 ENCOUNTER — Other Ambulatory Visit: Payer: Self-pay | Admitting: Family Medicine

## 2022-05-14 DIAGNOSIS — A6000 Herpesviral infection of urogenital system, unspecified: Secondary | ICD-10-CM

## 2022-05-21 ENCOUNTER — Other Ambulatory Visit: Payer: Self-pay | Admitting: Family Medicine

## 2022-05-21 NOTE — Telephone Encounter (Signed)
Pt cardiologist advised the Dr. Ronnald Ramp would be taking over the RX amLODipine (NORVASC) 5 MG tablet [id number]  ENDED/  pt needs refill sent  [place], [place] Phone:  [phone]  Fax:  [phone]

## 2022-05-22 MED ORDER — AMLODIPINE BESYLATE 5 MG PO TABS: 7.5000 mg | ORAL_TABLET | Freq: Every day | ORAL | 0 refills | Status: DC

## 2022-05-22 NOTE — Telephone Encounter (Signed)
Requested Prescriptions  Pending Prescriptions Disp Refills  . amLODipine (NORVASC) 5 MG tablet 45 tablet 0    Sig: Take 1.5 tablets (7.5 mg total) by mouth daily. Take 1.5 tablets daily= 7.5mg / Gwen Pounds     Cardiovascular: Calcium Channel Blockers 2 Passed - 05/21/2022  3:00 PM      Passed - Last BP in normal range    BP Readings from Last 1 Encounters:  02/02/22 132/85         Passed - Last Heart Rate in normal range    Pulse Readings from Last 1 Encounters:  02/02/22 77         Passed - Valid encounter within last 6 months    Recent Outpatient Visits          3 months ago Erroneous encounter - disregard   [facility] Duanne Limerick, MD   4 months ago Flank pain   [facility] Duanne Limerick, MD   7 months ago Pure hypercholesterolemia   [facility] Duanne Limerick, MD   1 year ago Left forearm pain   [facility] Duanne Limerick, MD   1 year ago Acne vulgaris   [facility] Duanne Limerick, MD      Future Appointments            In 1 week Duanne Limerick, MD [facility], [facility]

## 2022-05-26 ENCOUNTER — Ambulatory Visit: Payer: Medicaid Other | Attending: Orthopedic Surgery

## 2022-05-26 DIAGNOSIS — M79672 Pain in left foot: Secondary | ICD-10-CM | POA: Insufficient documentation

## 2022-05-26 NOTE — Therapy (Incomplete)
OUTPATIENT PHYSICAL THERAPY FOOT/ANKLE TREATMENT   Patient Name: Cheyenne Bean MRN: [id number] DOB:May 21, 1956, 66 y.o., female Today's Date: 05/26/2022     Past Medical History:  Diagnosis Date   BRCA negative 01/2017   MyRisk neg   Family history of breast cancer    1/18 IBIS=10.7%   Family history of ovarian cancer    MyRisk neg   Genital herpes    Hyperlipidemia    Hypertension    IBS (irritable bowel syndrome) 02/20/2017   Sarcoidosis    Past Surgical History:  Procedure Laterality Date   BACK SURGERY     BUNIONECTOMY Left    COLONOSCOPY  2012   cleared for 10 yrs   COLONOSCOPY WITH PROPOFOL N/A 02/27/2017   Procedure: COLONOSCOPY WITH PROPOFOL;  Surgeon: Lollie Sails, MD;  Location: [facility] ENDOSCOPY;  Service: Endoscopy;  Laterality: N/A;   VAGINAL HYSTERECTOMY     Patient Active Problem List   Diagnosis Date Noted   Pure hypercholesterolemia 11/04/2016   Essential hypertension 09/18/2015   Hyperlipidemia 09/18/2015   Recurrent genital herpes simplex 09/18/2015    PCP: Juline Patch, MD  REFERRING PROVIDER: Lear Ng*  REFERRING DIAGNOSIS: 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, left lower limb, M21.42 (ICD-10-CM) - Flat foot (pes planus) (acquired), left foot, M76.829 (ICD-10-CM) - Posterior tibial tendinitis, unspecified leg  THERAPY DIAG: Pain in left foot  ONSET DATE: 03/05/22  FOLLOW UP APPT WITH PROVIDER: Yes, pt reports she has a follow-up appointment in 3 months  Precautions: None  Weight Bearing Restrictions: No   FROM INITIAL EVALUATION 05/02/22  SUBJECTIVE:                                                                                                                                                                                         Chief Complaint: L foot pain  Pertinent History Pt underwent L Morton's neuroma surgery on March 15th 2023. She presents today due to ongoing numbness on left forefoot, scar  sensitivity, and navicular pain near poterior tibial insertion for the last 3 weeks. She was in a post-op shoe immediately after surgery but denies any WB restrictions. Pt has transitioned back to sneakers but states she hasn't been wearing her orthotics because she feels like they made her foot "too high" and they felt uncomfortable. She has not been performing her home exercises for her posterior tibial tendinitis since last discharge from therapy and did not restart when her symptoms recurred. She has been performing scar massage at home since her incision healed. Prior history from 01/15/21: Pt reports that approximately 1 year ago she started having L foot pain. She  was wearing a lot of flats around that time when the pain started started. The pain improved after approximately 1 week without any intervention. She was visiting her dad in October 2021 and her foot pain flared up significantly again. She was unable ot put weight on her foot due to the pain and had to use crutches to help her walk. She went to the urgent care and was sent to general orthopedics who recommended orthotics and gave her two steroid shots on her medial foot near her L posterior tibial tendon. She reports improvement for 1-2 days and then the pain returned. After returning to [place] she saw general orthopedics who placed a medial heel wedge in her shoes. He also prescribed her Voltaren gel for topical anti-inflammatory use to be used up to 4 times/day. 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 The pain is located on the medial aspect of her L foot at the insertion on the medial navicular and she describes it as "achy." No other recent changes in medication or health. History of chronic back pain with LLE radicular pain to the calf. Pt reports she has had back surgery without improvement but does not recall what type of surgery she had. Pt has a history of sarcoidosis but states that she had a breathing test yesterday  which was good.   Pain:  Pain Intensity: Present: 0/10, Best: 0/10, Worst: 6/10 Pain location: Dorsal aspect of L forefoot between digits 3-4 over incision scar and pain at insertion site of posterior tibialis tendon Pain quality: aching  Radiating pain: No  Swelling: No, initially swelling in the left ankle and lateral aspect of L foot after surgery but has resolved Numbness/Tingling: Yes, forefoot on dorsal and plantar aspects of digits 3-4 as well as plantar aspect over met heads of digits 3-4; Focal weakness or buckling: No Aggravating factors: tennis shoes with a narrow toe box, manual pressure over scar Relieving factors: rest and unweighting 24-hour pain behavior: No change Follow-up appointment with MD: Yes, in 3 months Dominant hand: right Prior level of function: Independent Occupational demands: Not currently working Hobbies: Arboriculturist flags (chills/fever, night sweats, nausea, vomiting, unrelenting pain): Negative Patient Goals: Improve numbness and decrease pain over scar and at posterior tibial insertion   OBJECTIVE:   Patient Surveys  FOTO 22, predicted improvement to 48  MUSCULOSKELETAL: Tremor: Absent Bulk: Normal Tone: Normal, No trophic changes noted to foot/ankle. No ecchymosis, erythema, or edema noted. No gross L ankle/foot deformity noted. Well healed incision between digits 3-4 on the forefoot dorsal aspect of the L foot  Lumbar/Hip/Knee Screen Deferred  Posture No gross abnormalities noted in seated posture. In sitting and standing posture pt with notable preservation of arch. She does tend to keep weight more on the lateral aspect of her L foot due to pt reporting it "feels funny" on the underside of her left foot with weightbearing  Gait Only mild pronation noted during gait bilaterally however not excessive and pt is able to maintain her arch. Somewhat difficult to assess due to patient limiting weight shift to the medial aspect of the left  foot because of altered sensation and it "feeling funny."   Palpation Pain to palpation at the insertion of the posterior tibial tendon on the navicular. She denies any pain with palpation proximally along the rest of the L posterior tibial tendon or anywhere further up the leg. No pain to palpation along medial and lateral malleoli. No pain with palpation  of the rest of the midfoot. Achilles tendon intact and painless to palpation  Strength R/L 5/5 Hip flexion 5/5 Hip abduction (seated) 5/5 Hip adduction (seated) 5/5 Knee extension 5/5 Knee flexion (seated) 5/5 Ankle Plantarflexion 5/5 Ankle Dorsiflexion 5/5 Ankle Inversion 5/5 Ankle Eversion Decreased toe flexion strength in digits 3-5 of L foot  AROM Deferred  Passive Accessory Motion Deferred  NEUROLOGICAL:  Mental Status Patient is oriented to person, place and time.  Recent memory is intact.  Remote memory is intact.  Attention span and concentration are intact.  Expressive speech is intact.  Patient's fund of knowledge is within normal limits for educational level.  Sensation Decreased sensation dorsal and plantar aspects of digits 4 and 5 on the left foot as well as over the met heads on the plantar surface of digits 4-5;  Reflexes Deferred  VASCULAR Deferred  SPECIAL TESTS Deferred   TODAY'S TREATMENT (05/26/22)  SUBJECTIVE  PAIN  Ther-ex  Measure L ankle ROM, review HEP including scar massage, progress strengthening  - Seated Arch Lifts  - 2 x daily - 7 x weekly - 2 sets - 10 reps - 5s hold - Seated Toe Towel Scrunches  - 1 x daily - 7 x weekly - 2 sets - 10 reps - 5s hold - Isometric Ankle Inversion at Wall  - 2 x daily - 7 x weekly - 2 sets - 5 reps - 10s hold - Seated Ankle Inversion Eversion PROM  - 2 x daily - 7 x weekly - 3 reps - 45s hold - Seated Heel Raise  - 2 x daily - 7 x weekly - 1 sets - 20 reps - Standing Gastroc Stretch on Step with Counter Support  - 2 x daily - 7 x weekly - 3 reps  - 45s hold   PATIENT EDUCATION:  Education details: Plan of care, HEP, scar massage, importance of HEP Person educated: Patient Education method: Explanation, video, handout Education comprehension: verbalized understanding   HOME EXERCISE PROGRAM: Access Code: [email or web address] URL: [email or web address] Date: 05/02/2022 Prepared by: Roxana Hires  Exercises - Seated Arch Lifts  - 2 x daily - 7 x weekly - 2 sets - 10 reps - 5s hold - Seated Toe Towel Scrunches  - 1 x daily - 7 x weekly - 2 sets - 10 reps - 5s hold - Isometric Ankle Inversion at Wall  - 2 x daily - 7 x weekly - 2 sets - 5 reps - 10s hold - Seated Ankle Inversion Eversion PROM  - 2 x daily - 7 x weekly - 3 reps - 45s hold - Seated Heel Raise  - 2 x daily - 7 x weekly - 1 sets - 20 reps - Standing Gastroc Stretch on Step with Counter Support  - 2 x daily - 7 x weekly - 3 reps - 45s hold  Patient Education - Scar Massage   ASSESSMENT:  CLINICAL IMPRESSION: Patient is a 66 y.o. female who was seen today for physical therapy evaluation and treatment s/p L Morton's neuroma surgery and L posterior tibialis tendon pain. Since the surgery pt has transitioned back to sneakers but states she hasn't been wearing her orthotics and she has not been performing her home exercises since last discharge from therapy for this same issue. She has been performing scar massage and is able to properly verbalize and demonstrate technique. Scar moves freely today without any notable restriction. Reissued HEP with addition of toe flexion exercises due to weakness of toe flexion  in digits 3-5 of the L foot. Education provided regarding scar massage and pt advised to resume orthotics with slow introduction into her sneakers. She will follow-up for recheck in two weeks. Objective impairments include Abnormal gait, decreased strength, and pain. These impairments are limiting patient from cleaning and community activity. Personal factors  including Age, Past/current experiences, and Time since onset of injury/illness/exacerbation are also affecting patient's functional outcome. Patient will benefit from skilled PT to address above impairments and improve overall function.  REHAB POTENTIAL: Good  CLINICAL DECISION MAKING: Stable/uncomplicated  EVALUATION COMPLEXITY: Low   GOALS: Goals reviewed with patient? No  SHORT TERM GOALS: Target date: 06/23/2022  Pt will be independent with HEP to improve strength and decrease foot/toe pain to improve pain-free function at home. Baseline:  Goal status: INITIAL   LONG TERM GOALS: Target date: 07/21/2022  Pt will increase FOTO to at least 48 to demonstrate significant improvement in function at home related to foot pain  Baseline: 05/02/22: 22 Goal status: INITIAL  2.  Pt will decrease worst foot/toe pain by at least 3 points on the NPRS in order to demonstrate clinically significant reduction in pain Baseline: 05/02/22: worst: 6/10 Goal status: INITIAL  3.  Pt will ambulate with full heel/toe pattern in LLE in order to normalize gait to decrease abnormal forces through foot, ankle, knee, hip, and back to minimize pain       Baseline: 05/02/22: L flat foot strike pattern with no toe off Goal status: INITIAL    PLAN: PT FREQUENCY:  biweekly  PT DURATION: 8 weeks  PLANNED INTERVENTIONS: Therapeutic exercises, Therapeutic activity, Neuromuscular re-education, Balance training, Gait training, Patient/Family education, Joint manipulation, Joint mobilization, Vestibular training, Canalith repositioning, Aquatic Therapy, Dry Needling, Electrical stimulation, Spinal manipulation, Spinal mobilization, Cryotherapy, Moist heat, scar mobilization, Taping, Traction, Ultrasound, Ionotophoresis 17m/ml Dexamethasone, and Manual therapy  PLAN FOR NEXT SESSION: Measure L ankle ROM, review HEP including scar massage, progress strengthening  JLyndel SafeHuprich PT, DPT, GCS   Deshane Cotroneo 05/26/2022, 8:21 AM

## 2022-06-02 ENCOUNTER — Ambulatory Visit (INDEPENDENT_AMBULATORY_CARE_PROVIDER_SITE_OTHER): Payer: Medicare Other | Admitting: Family Medicine

## 2022-06-02 ENCOUNTER — Encounter: Payer: Self-pay | Admitting: Family Medicine

## 2022-06-02 VITALS — BP 120/80 | HR 64 | Ht 65.0 in | Wt 146.0 lb

## 2022-06-02 DIAGNOSIS — E7800 Pure hypercholesterolemia, unspecified: Secondary | ICD-10-CM | POA: Diagnosis not present

## 2022-06-02 DIAGNOSIS — A6000 Herpesviral infection of urogenital system, unspecified: Secondary | ICD-10-CM

## 2022-06-02 DIAGNOSIS — R748 Abnormal levels of other serum enzymes: Secondary | ICD-10-CM

## 2022-06-02 DIAGNOSIS — I10 Essential (primary) hypertension: Secondary | ICD-10-CM | POA: Diagnosis not present

## 2022-06-02 MED ORDER — VALACYCLOVIR HCL 500 MG PO TABS: 500.0000 mg | ORAL_TABLET | Freq: Every day | ORAL | 1 refills | Status: DC

## 2022-06-02 MED ORDER — AMLODIPINE BESYLATE 5 MG PO TABS: 7.5000 mg | ORAL_TABLET | Freq: Every day | ORAL | 1 refills | Status: DC

## 2022-06-02 MED ORDER — PRAVASTATIN SODIUM 20 MG PO TABS: 20.0000 mg | ORAL_TABLET | Freq: Every day | ORAL | 1 refills | Status: DC

## 2022-06-02 NOTE — Progress Notes (Signed)
Date:  06/02/2022   Name:  Cheyenne Bean   DOB:  Feb 15, 1956   MRN:  [id number]   Chief Complaint: Hypertension, Hyperlipidemia, and genital herpes  Hypertension This is a chronic problem. The current episode started more than 1 year ago. The problem has been gradually improving since onset. The problem is controlled. Pertinent negatives include no chest pain, headaches, neck pain, palpitations or shortness of breath. There are no associated agents to hypertension. Risk factors for coronary artery disease include dyslipidemia. Past treatments include calcium channel blockers. The current treatment provides moderate improvement. There are no compliance problems.   Hyperlipidemia This is a chronic problem. The current episode started more than 1 year ago. The problem is controlled. Recent lipid tests were reviewed and are normal. She has no history of diabetes. Pertinent negatives include no chest pain, myalgias or shortness of breath. Current antihyperlipidemic treatment includes statins. The current treatment provides moderate improvement of lipids. There are no compliance problems.  Risk factors for coronary artery disease include dyslipidemia and hypertension.    Lab Results  Component Value Date   NA 141 10/14/2021   K 4.7 10/14/2021   CO2 26 10/14/2021   GLUCOSE 85 10/14/2021   BUN 12 10/14/2021   CREATININE 0.75 10/14/2021   CALCIUM 9.8 10/14/2021   EGFR 90 10/14/2021   GFRNONAA >60 12/13/2020   Lab Results  Component Value Date   CHOL 157 10/14/2021   HDL 50 10/14/2021   LDLCALC 93 10/14/2021   TRIG 69 10/14/2021   CHOLHDL 3.2 03/26/2018   No results found for: "TSH" No results found for: "HGBA1C" Lab Results  Component Value Date   WBC 5.2 12/27/2021   HGB 14.0 12/27/2021   HCT 41.3 12/27/2021   MCV 87 12/27/2021   PLT 236 12/27/2021   Lab Results  Component Value Date   ALT 17 12/27/2021   AST 20 12/27/2021   ALKPHOS 123 (H) 12/27/2021   BILITOT 0.3  12/27/2021   No results found for: "25OHVITD2", "25OHVITD3", "VD25OH"   Review of Systems  Constitutional:  Negative for chills, fatigue and fever.  HENT:  Negative for sore throat.   Respiratory:  Negative for cough, shortness of breath and wheezing.   Cardiovascular:  Negative for chest pain, palpitations and leg swelling.  Gastrointestinal:  Negative for abdominal pain, blood in stool, constipation, diarrhea and nausea.  Endocrine: Negative for polydipsia.  Genitourinary:  Negative for dysuria, frequency, hematuria and urgency.  Musculoskeletal:  Negative for back pain, myalgias and neck pain.  Skin:  Negative for rash.  Allergic/Immunologic: Negative for environmental allergies.  Neurological:  Negative for dizziness and headaches.  Hematological:  Does not bruise/bleed easily.  Psychiatric/Behavioral:  Negative for suicidal ideas. The patient is not nervous/anxious.     Patient Active Problem List   Diagnosis Date Noted   Pure hypercholesterolemia 11/04/2016   Essential hypertension 09/18/2015   Hyperlipidemia 09/18/2015   Recurrent genital herpes simplex 09/18/2015    No Known Allergies  Past Surgical History:  Procedure Laterality Date   BACK SURGERY     BUNIONECTOMY Left    COLONOSCOPY  2012   cleared for 10 yrs   COLONOSCOPY WITH PROPOFOL N/A 02/27/2017   Procedure: COLONOSCOPY WITH PROPOFOL;  Surgeon: Lollie Sails, MD;  Location: [facility] ENDOSCOPY;  Service: Endoscopy;  Laterality: N/A;   VAGINAL HYSTERECTOMY      Social History   Tobacco Use   Smoking status: Never   Smokeless tobacco: Never  Vaping Use  Vaping Use: Never used  Substance Use Topics   Alcohol use: No    Alcohol/week: 0.0 standard drinks of alcohol   Drug use: No     Medication list has been reviewed and updated.  Current Meds  Medication Sig   amLODipine (NORVASC) 5 MG tablet Take 1.5 tablets (7.5 mg total) by mouth daily. Take 1.5 tablets daily= 7.92m/ K[place]alski   ibuprofen  (ADVIL) 600 MG tablet Take 1 tablet (600 mg total) by mouth every 6 (six) hours as needed.   meclizine (ANTIVERT) 25 MG tablet Take 1 tablet (25 mg total) by mouth as needed for dizziness.   pravastatin (PRAVACHOL) 20 MG tablet Take 1 tablet by mouth once daily   valACYclovir (VALTREX) 500 MG tablet Take 1 tablet by mouth once daily       06/02/2022    8:16 AM 01/27/2022    1:34 PM 10/14/2021   11:09 AM 11/28/2020   11:20 AM  GAD 7 : Generalized Anxiety Score  Nervous, Anxious, on Edge 0 1 0 0  Control/stop worrying 0 1 0 0  Worry too much - different things 0 2 0 0  Trouble relaxing 0 1 0 0  Restless 0 3 0 0  Easily annoyed or irritable 0 1 0 0  Afraid - awful might happen 0 0 0 0  Total GAD 7 Score 0 9 0 0  Anxiety Difficulty Not difficult at all Very difficult         06/02/2022    8:16 AM  Depression screen PHQ 2/9  Decreased Interest 0  Down, Depressed, Hopeless 0  PHQ - 2 Score 0  Altered sleeping 0  Tired, decreased energy 0  Change in appetite 0  Feeling bad or failure about yourself  0  Trouble concentrating 0  Moving slowly or fidgety/restless 0  Suicidal thoughts 0  PHQ-9 Score 0  Difficult doing work/chores Not difficult at all    BP Readings from Last 3 Encounters:  06/02/22 120/80  02/02/22 132/85  01/27/22 114/70    Physical Exam Vitals and nursing note reviewed. Exam conducted with a chaperone present.  Constitutional:      General: She is not in acute distress.    Appearance: She is not diaphoretic.  HENT:     Head: Normocephalic and atraumatic.     Right Ear: Tympanic membrane, ear canal and external ear normal.     Left Ear: Tympanic membrane, ear canal and external ear normal.     Nose: Nose normal. No congestion or rhinorrhea.     Mouth/Throat:     Mouth: Mucous membranes are moist.  Eyes:     General:        Right eye: No discharge.        Left eye: No discharge.     Conjunctiva/sclera: Conjunctivae normal.     Pupils: Pupils are  equal, round, and reactive to light.  Neck:     Thyroid: No thyromegaly.     Vascular: No JVD.  Cardiovascular:     Rate and Rhythm: Normal rate and regular rhythm.     Heart sounds: Normal heart sounds. No murmur heard.    No friction rub. No gallop.  Pulmonary:     Effort: Pulmonary effort is normal.     Breath sounds: Normal breath sounds. No wheezing or rhonchi.  Abdominal:     General: Bowel sounds are normal.     Palpations: Abdomen is soft. There is no mass.  Tenderness: There is no abdominal tenderness. There is no guarding.  Musculoskeletal:        General: Normal range of motion.     Cervical back: Normal range of motion and neck supple.  Lymphadenopathy:     Cervical: No cervical adenopathy.  Skin:    General: Skin is warm and dry.  Neurological:     Mental Status: She is alert.     Deep Tendon Reflexes: Reflexes are normal and symmetric.     Wt Readings from Last 3 Encounters:  06/02/22 146 lb (66.2 kg)  02/03/22 146 lb (66.2 kg)  02/02/22 147 lb (66.7 kg)    BP 120/80   Pulse 64   Ht '5\' 5"'  (1.651 m)   Wt 146 lb (66.2 kg)   BMI 24.30 kg/m   Assessment and Plan: 1. Essential hypertension Chronic.  Controlled.  Stable.  Blood pressure is 120/80 today.  Continue amlodipine 5 mg 1-1/2 tablets for total of 7.5 mg once a day.  We will check renal function panel for electrolytes and GFR.  Recheck patient in 6 months for - amLODipine (NORVASC) 5 MG tablet; Take 1.5 tablets (7.5 mg total) by mouth daily. Take 1.5 tablets daily= 7.60m/ KNehemiah Massed Dispense: 135 tablet; Refill: 1 - Renal Function Panel  2. Pure hypercholesterolemia Chronic.  Controlled.  Stable.  Will check lipid panel for surveillance of LDL.  Continue pravastatin 20 mg once a day.  We will recheck in 6 months. - pravastatin (PRAVACHOL) 20 MG tablet; Take 1 tablet (20 mg total) by mouth daily.  Dispense: 90 tablet; Refill: 1 - Lipid Panel With LDL/HDL Ratio  3. Recurrent genital herpes  simplex Patient with history of recurrent herpes simplex 2.  We will suppress with continuance of Valtrex 500 mg once a day. - valACYclovir (VALTREX) 500 MG tablet; Take 1 tablet (500 mg total) by mouth daily.  Dispense: 90 tablet; Refill: 1  4. Elevated alkaline phosphatase level Mild elevation of alk phos and we will check a repeat of alk phos for surveillance of value. - Alkaline phosphatase

## 2022-06-03 ENCOUNTER — Ambulatory Visit: Payer: Medicaid Other | Admitting: Family Medicine

## 2022-06-03 LAB — RENAL FUNCTION PANEL
Albumin: 4.5 g/dL (ref 3.8–4.8)
BUN/Creatinine Ratio: 23 (ref 12–28)
BUN: 16 mg/dL (ref 8–27)
CO2: 22 mmol/L (ref 20–29)
Calcium: 9.6 mg/dL (ref 8.7–10.3)
Chloride: 107 mmol/L — ABNORMAL HIGH (ref 96–106)
Creatinine, Ser: 0.71 mg/dL (ref 0.57–1.00)
Glucose: 93 mg/dL (ref 70–99)
Phosphorus: 4 mg/dL (ref 3.0–4.3)
Potassium: 4 mmol/L (ref 3.5–5.2)
Sodium: 144 mmol/L (ref 134–144)
eGFR: 95 mL/min/{1.73_m2} (ref >59)

## 2022-06-03 LAB — LIPID PANEL WITH LDL/HDL RATIO
Cholesterol, Total: 153 mg/dL (ref 100–199)
HDL: 54 mg/dL (ref >39)
LDL Chol Calc (NIH): 89 mg/dL (ref 0–99)
LDL/HDL Ratio: 1.6 ratio (ref 0.0–3.2)
Triglycerides: 45 mg/dL (ref 0–149)
VLDL Cholesterol Cal: 10 mg/dL (ref 5–40)

## 2022-06-03 LAB — ALKALINE PHOSPHATASE: Alkaline Phosphatase: 126 IU/L — ABNORMAL HIGH (ref 44–121)

## 2022-06-04 LAB — AST: AST: 18 IU/L (ref 0–40)

## 2022-06-04 LAB — SPECIMEN STATUS REPORT

## 2022-06-04 LAB — ALT: ALT: 19 IU/L (ref 0–32)

## 2022-06-16 DIAGNOSIS — M79672 Pain in left foot: Secondary | ICD-10-CM

## 2022-07-04 NOTE — Therapy (Signed)
OUTPATIENT PHYSICAL THERAPY FOOT/ANKLE TREATMENT/RECERTIFICATION   Patient Name: Cheyenne Bean MRN: [id number] DOB:December 21, 1956, 66 y.o., female Today's Date: 07/07/2022   PT End of Session - 07/07/22 1106     Visit Number 2    Number of Visits 9    Date for PT Re-Evaluation 09/01/22    Authorization Type eval: 05/02/22    PT Start Time 0845    PT Stop Time 0918    PT Time Calculation (min) 33 min    Activity Tolerance Patient tolerated treatment well    Behavior During Therapy [facility] for tasks assessed/performed              Past Medical History:  Diagnosis Date   BRCA negative 01/2017   MyRisk neg   Family history of breast cancer    1/18 IBIS=10.7%   Family history of ovarian cancer    MyRisk neg   Genital herpes    Hyperlipidemia    Hypertension    IBS (irritable bowel syndrome) 02/20/2017   Sarcoidosis    Past Surgical History:  Procedure Laterality Date   BACK SURGERY     BUNIONECTOMY Left    COLONOSCOPY  2012   cleared for 10 yrs   COLONOSCOPY WITH PROPOFOL N/A 02/27/2017   Procedure: COLONOSCOPY WITH PROPOFOL;  Surgeon: Lollie Sails, MD;  Location: [facility] ENDOSCOPY;  Service: Endoscopy;  Laterality: N/A;   VAGINAL HYSTERECTOMY     Patient Active Problem List   Diagnosis Date Noted   Pure hypercholesterolemia 11/04/2016   Essential hypertension 09/18/2015   Hyperlipidemia 09/18/2015   Recurrent genital herpes simplex 09/18/2015    PCP: Juline Patch, MD  REFERRING PROVIDER: Lear Ng*  REFERRING DIAGNOSIS: 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, left lower limb, M21.42 (ICD-10-CM) - Flat foot (pes planus) (acquired), left foot, M76.829 (ICD-10-CM) - Posterior tibial tendinitis, unspecified leg  THERAPY DIAG: Pain in left foot  ONSET DATE: 03/05/22  FOLLOW UP APPT WITH PROVIDER: Yes, pt reports she has a follow-up appointment in 3 months  Precautions: None  Weight Bearing Restrictions: No   FROM INITIAL EVALUATION  05/02/22  SUBJECTIVE:                                                                                                                                                                                         Chief Complaint: L foot pain  Pertinent History Pt underwent L Morton's neuroma surgery on March 15th 2023. She presents today due to ongoing numbness on left forefoot, scar sensitivity, and navicular pain near poterior tibial insertion for the last 3 weeks. She was in a post-op shoe immediately after surgery  but denies any WB restrictions. Pt has transitioned back to sneakers but states she hasn't been wearing her orthotics because she feels like they made her foot "too high" and they felt uncomfortable. She has not been performing her home exercises for her posterior tibial tendinitis since last discharge from therapy and did not restart when her symptoms recurred. She has been performing scar massage at home since her incision healed. Prior history from 01/15/21: Pt reports that approximately 1 year ago she started having L foot pain. She was wearing a lot of flats around that time when the pain started started. The pain improved after approximately 1 week without any intervention. She was visiting her dad in October 2021 and her foot pain flared up significantly again. She was unable ot put weight on her foot due to the pain and had to use crutches to help her walk. She went to the urgent care and was sent to general orthopedics who recommended orthotics and gave her two steroid shots on her medial foot near her L posterior tibial tendon. She reports improvement for 1-2 days and then the pain returned. After returning to [place] she saw general orthopedics who placed a medial heel wedge in her shoes. He also prescribed her Voltaren gel for topical anti-inflammatory use to be used up to 4 times/day. 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 The pain is located on the medial aspect of her  L foot at the insertion on the medial navicular and she describes it as "achy." No other recent changes in medication or health. History of chronic back pain with LLE radicular pain to the calf. Pt reports she has had back surgery without improvement but does not recall what type of surgery she had. Pt has a history of sarcoidosis but states that she had a breathing test yesterday which was good.   Pain:  Pain Intensity: Present: 0/10, Best: 0/10, Worst: 6/10 Pain location: Dorsal aspect of L forefoot between digits 3-4 over incision scar and pain at insertion site of posterior tibialis tendon Pain quality: aching  Radiating pain: No  Swelling: No, initially swelling in the left ankle and lateral aspect of L foot after surgery but has resolved Numbness/Tingling: Yes, forefoot on dorsal and plantar aspects of digits 3-4 as well as plantar aspect over met heads of digits 3-4; Focal weakness or buckling: No Aggravating factors: tennis shoes with a narrow toe box, manual pressure over scar Relieving factors: rest and unweighting 24-hour pain behavior: No change Follow-up appointment with MD: Yes, in 3 months Dominant hand: right Prior level of function: Independent Occupational demands: Not currently working Hobbies: Arboriculturist flags (chills/fever, night sweats, nausea, vomiting, unrelenting pain): Negative Patient Goals: Improve numbness and decrease pain over scar and at posterior tibial insertion   OBJECTIVE:   Patient Surveys  FOTO 22, predicted improvement to 48  MUSCULOSKELETAL: Tremor: Absent Bulk: Normal Tone: Normal, No trophic changes noted to foot/ankle. No ecchymosis, erythema, or edema noted. No gross L ankle/foot deformity noted. Well healed incision between digits 3-4 on the forefoot dorsal aspect of the L foot  Lumbar/Hip/Knee Screen Deferred  Posture No gross abnormalities noted in seated posture. In sitting and standing posture pt with notable preservation  of arch. She does tend to keep weight more on the lateral aspect of her L foot due to pt reporting it "feels funny" on the underside of her left foot with weightbearing  Gait Only mild pronation noted during gait bilaterally  however not excessive and pt is able to maintain her arch. Somewhat difficult to assess due to patient limiting weight shift to the medial aspect of the left foot because of altered sensation and it "feeling funny."   Palpation Pain to palpation at the insertion of the posterior tibial tendon on the navicular. She denies any pain with palpation proximally along the rest of the L posterior tibial tendon or anywhere further up the leg. No pain to palpation along medial and lateral malleoli. No pain with palpation of the rest of the midfoot. Achilles tendon intact and painless to palpation  Strength R/L 5/5 Hip flexion 5/5 Hip abduction (seated) 5/5 Hip adduction (seated) 5/5 Knee extension 5/5 Knee flexion (seated) 5/5 Ankle Plantarflexion 5/5 Ankle Dorsiflexion 5/5 Ankle Inversion 5/5 Ankle Eversion Decreased toe flexion strength in digits 3-5 of L foot  AROM Deferred  Passive Accessory Motion Deferred  NEUROLOGICAL:  Mental Status Patient is oriented to person, place and time.  Recent memory is intact.  Remote memory is intact.  Attention span and concentration are intact.  Expressive speech is intact.  Patient's fund of knowledge is within normal limits for educational level.  Sensation Decreased sensation dorsal and plantar aspects of digits 4 and 5 on the left foot as well as over the met heads on the plantar surface of digits 4-5;  Reflexes Deferred  VASCULAR Deferred  SPECIAL TESTS Deferred   TODAY'S TREATMENT (05/26/22)  SUBJECTIVE: Pt reports 7/10 L medial pain upon arrival. She is performing her HEP around once/week. She is performing heel raises (painful) and towel scrunches (non-painful). She is wearing her orthotics daily.  She  continues to get a pulling sensation in her dorsal foot between digits 3-4.  PAIN: 7/10 medial foot pain  Therapeutic Exercise Seated towel scrunches x multiple bouts; Seated marble pick-ups with digits 3-4 x multiple bouts; Eccentric L plantarflexion strengthening on step 2 x 10; Seated concentric heel raises with ball squeeze between ankles x 10; Discussed HEP progressions and regression;   Manual Technique Tibialis posterior stretch x 30s; Ice massage x 7 minutes until numbness achieved   PATIENT EDUCATION:  Education details: Plan of care, HEP, importance of HEP, Pt educated throughout session about proper posture and technique with exercises. Improved exercise technique, movement at target joints, use of target muscles after min to mod verbal, visual, tactile cues.  Person educated: Patient Education method: Explanation, video, handout Education comprehension: verbalized understanding   HOME EXERCISE PROGRAM: Access Code: [email or web address] URL: [email or web address] Date: 05/02/2022 Prepared by: Roxana Hires  Exercises - Seated Arch Lifts  - 2 x daily - 7 x weekly - 2 sets - 10 reps - 5s hold - Seated Toe Towel Scrunches  - 1 x daily - 7 x weekly - 2 sets - 10 reps - 5s hold - Isometric Ankle Inversion at Wall  - 2 x daily - 7 x weekly - 2 sets - 5 reps - 10s hold - Seated Ankle Inversion Eversion PROM  - 2 x daily - 7 x weekly - 3 reps - 45s hold - Seated Heel Raise  - 2 x daily - 7 x weekly - 1 sets - 20 reps - Standing Gastroc Stretch on Step with Counter Support  - 2 x daily - 7 x weekly - 3 reps - 45s hold  Patient Education - Scar Massage   ASSESSMENT:  CLINICAL IMPRESSION: Unable to update outcome measures during visit today as there is no internet in the clinic at the time  of the appointment. Will update goals at next therapy session. This is the first follow-up appointment since the initial evaluation on 05/02/22. Pt reports continued medial foot pain.  She is not performing her HEP consistently so encouraged pt to perform her exercises daily at home. Performed manual techniques and strengthening during session today. Education provided regarding progression and regressions at home. She will be leaving town for an indeterminate amount of time and will call to reschedule for follow-up when she returns. Pt will benefit from PT services to address deficits in strength, range of motion, and pain in order to return to full function at home.   REHAB POTENTIAL: Good  CLINICAL DECISION MAKING: Stable/uncomplicated  EVALUATION COMPLEXITY: Low   GOALS: Goals reviewed with patient? No  SHORT TERM GOALS: Target date: 08/04/2022  Pt will be independent with HEP to improve strength and decrease foot/toe pain to improve pain-free function at home. Baseline:  Goal status: INITIAL   LONG TERM GOALS: Target date: 09/01/2022  Pt will increase FOTO to at least 48 to demonstrate significant improvement in function at home related to foot pain  Baseline: 05/02/22: 22 Goal status: INITIAL  2.  Pt will decrease worst foot/toe pain by at least 3 points on the NPRS in order to demonstrate clinically significant reduction in pain Baseline: 05/02/22: worst: 6/10 Goal status: INITIAL  3.  Pt will ambulate with full heel/toe pattern in LLE in order to normalize gait to decrease abnormal forces through foot, ankle, knee, hip, and back to minimize pain       Baseline: 05/02/22: L flat foot strike pattern with no toe off Goal status: INITIAL    PLAN: PT FREQUENCY:  biweekly  PT DURATION: 8 weeks  PLANNED INTERVENTIONS: Therapeutic exercises, Therapeutic activity, Neuromuscular re-education, Balance training, Gait training, Patient/Family education, Joint manipulation, Joint mobilization, Vestibular training, Canalith repositioning, Aquatic Therapy, Dry Needling, Electrical stimulation, Spinal manipulation, Spinal mobilization, Cryotherapy, Moist heat, scar  mobilization, Taping, Traction, Ultrasound, Ionotophoresis 59m/ml Dexamethasone, and Manual therapy  PLAN FOR NEXT SESSION: Measure L ankle ROM, review HEP including scar massage, progress strengthening  JLyndel SafeHuprich PT, DPT, GCS  Ashia Dehner 07/07/2022, 11:28 AM

## 2022-07-07 ENCOUNTER — Ambulatory Visit: Payer: Medicare Other | Attending: Orthopedic Surgery

## 2022-07-07 DIAGNOSIS — M79672 Pain in left foot: Secondary | ICD-10-CM | POA: Diagnosis not present

## 2022-07-21 ENCOUNTER — Inpatient Hospital Stay: Admission: RE | Admit: 2022-07-21 | Payer: Medicaid Other | Source: Ambulatory Visit

## 2022-07-28 ENCOUNTER — Ambulatory Visit: Payer: Medicare Other | Attending: Orthopedic Surgery

## 2022-07-28 DIAGNOSIS — M79672 Pain in left foot: Secondary | ICD-10-CM | POA: Insufficient documentation

## 2022-09-01 ENCOUNTER — Inpatient Hospital Stay: Admission: RE | Admit: 2022-09-01 | Payer: Self-pay | Source: Ambulatory Visit

## 2022-09-29 ENCOUNTER — Ambulatory Visit
Admission: RE | Admit: 2022-09-29 | Discharge: 2022-09-29 | Disposition: A | Discharge status: Home / Self Care | Payer: Medicare Other | Source: Ambulatory Visit | Attending: Obstetrics and Gynecology | Admitting: Obstetrics and Gynecology

## 2022-09-29 ENCOUNTER — Ambulatory Visit
Admission: EM | Admit: 2022-09-29 | Discharge: 2022-09-29 | Disposition: A | Discharge status: Home / Self Care | Payer: Medicare Other | Attending: Physician Assistant | Admitting: Physician Assistant

## 2022-09-29 DIAGNOSIS — Z1231 Encounter for screening mammogram for malignant neoplasm of breast: Secondary | ICD-10-CM | POA: Insufficient documentation

## 2022-09-29 DIAGNOSIS — R197 Diarrhea, unspecified: Secondary | ICD-10-CM | POA: Diagnosis not present

## 2022-09-29 DIAGNOSIS — B349 Viral infection, unspecified: Secondary | ICD-10-CM | POA: Insufficient documentation

## 2022-09-29 DIAGNOSIS — U071 COVID-19: Secondary | ICD-10-CM | POA: Insufficient documentation

## 2022-09-29 DIAGNOSIS — R110 Nausea: Secondary | ICD-10-CM | POA: Insufficient documentation

## 2022-09-29 DIAGNOSIS — R051 Acute cough: Secondary | ICD-10-CM | POA: Insufficient documentation

## 2022-09-29 LAB — RESP PANEL BY RT-PCR (FLU A&B, COVID) ARPGX2
Influenza A by PCR: NEGATIVE
Influenza B by PCR: NEGATIVE
SARS Coronavirus 2 by RT PCR: POSITIVE — AB

## 2022-09-29 MED ORDER — MOLNUPIRAVIR 200 MG PO CAPS: 4.0000 | ORAL_CAPSULE | Freq: Two times a day (BID) | ORAL | 0 refills | Status: AC

## 2022-09-29 MED ORDER — ONDANSETRON 4 MG PO TBDP: 4.0000 mg | ORAL_TABLET | Freq: Three times a day (TID) | ORAL | 0 refills | Status: DC | PRN

## 2022-09-29 NOTE — ED Provider Notes (Signed)
[facility]    CSN: [id number] Arrival date & time: 09/29/22  1539      History   Chief Complaint Chief Complaint  Patient presents with   Diarrhea    HPI Cheyenne Bean is a 66 y.o. female presenting for low-grade fever of 100.4 degrees, 3 episodes of diarrhea, nausea, feeling dizzy with headaches and a cough.  Patient says symptoms started with a cough about 2 days ago and then she started to experience all the other symptoms last night.  She reports that she did eat some chicken that might have been expired and thought that could be part of the reason for her symptoms.  She also reports that a coworker is sick with a cough and congestion.  She says that she did do a COVID test this morning and it was negative.  Denies any high-grade fever, sore throat, ear pain, sinus pain, chest pain, shortness of breath, abdominal pain or vomiting.  Has been taking over-the-counter Coricidin HBP.  Medical history significant for hypertension, hyperlipidemia and sarcoidosis.  HPI  Past Medical History:  Diagnosis Date   BRCA negative 01/2017   MyRisk neg   Family history of breast cancer    1/18 IBIS=10.7%   Family history of ovarian cancer    MyRisk neg   Genital herpes    Hyperlipidemia    Hypertension    IBS (irritable bowel syndrome) 02/20/2017   Sarcoidosis     Patient Active Problem List   Diagnosis Date Noted   Pure hypercholesterolemia 11/04/2016   Essential hypertension 09/18/2015   Hyperlipidemia 09/18/2015   Recurrent genital herpes simplex 09/18/2015    Past Surgical History:  Procedure Laterality Date   BACK SURGERY     BUNIONECTOMY Left    COLONOSCOPY  2012   cleared for 10 yrs   COLONOSCOPY WITH PROPOFOL N/A 02/27/2017   Procedure: COLONOSCOPY WITH PROPOFOL;  Surgeon: Lollie Sails, MD;  Location: [facility] ENDOSCOPY;  Service: Endoscopy;  Laterality: N/A;   VAGINAL HYSTERECTOMY      OB History     Gravida  1   Para  0   Term       Preterm      AB  1   Living  0      SAB      IAB      Ectopic  1   Multiple      Live Births               Home Medications    Prior to Admission medications   Medication Sig Start Date End Date Taking? Authorizing Provider  amLODipine (NORVASC) 5 MG tablet Take 1.5 tablets (7.5 mg total) by mouth daily. Take 1.5 tablets daily= 7.54m/ KNehemiah Massed6/12/23 01/17/24 Yes JJuline Patch MD  ibuprofen (ADVIL) 600 MG tablet Take 1 tablet (600 mg total) by mouth every 6 (six) hours as needed. 02/02/22  Yes MMelynda Ripple MD  meclizine (ANTIVERT) 25 MG tablet Take 1 tablet (25 mg total) by mouth as needed for dizziness. 10/17/21  Yes JJuline Patch MD  ondansetron (ZOFRAN-ODT) 4 MG disintegrating tablet Take 1 tablet (4 mg total) by mouth every 8 (eight) hours as needed for nausea or vomiting. 09/29/22  Yes ELaurene FootmanB, PA-C  pravastatin (PRAVACHOL) 20 MG tablet Take 1 tablet (20 mg total) by mouth daily. 06/02/22  Yes JJuline Patch MD  valACYclovir (VALTREX) 500 MG tablet Take 1 tablet (500 mg total) by mouth daily. 06/02/22  Yes Juline Patch, MD    Family History Family History  Problem Relation Age of Onset   Hypertension Mother    Breast cancer Sister 29   Cervical cancer Sister 67   Ovarian cancer Maternal Aunt 60   Colon cancer Maternal Uncle 65    Social History Social History   Tobacco Use   Smoking status: Never   Smokeless tobacco: Never  Vaping Use   Vaping Use: Never used  Substance Use Topics   Alcohol use: No    Alcohol/week: 0.0 standard drinks of alcohol   Drug use: No     Allergies   Patient has no known allergies.   Review of Systems Review of Systems  Constitutional:  Positive for fatigue and fever. Negative for chills and diaphoresis.  HENT:  Negative for congestion, ear pain, rhinorrhea, sinus pressure, sinus pain and sore throat.   Respiratory:  Positive for cough. Negative for shortness of breath.   Gastrointestinal:   Positive for diarrhea and nausea. Negative for abdominal pain and vomiting.  Musculoskeletal:  Negative for arthralgias and myalgias.  Skin:  Negative for rash.  Neurological:  Positive for dizziness and headaches. Negative for weakness.  Hematological:  Negative for adenopathy.     Physical Exam Triage Vital Signs ED Triage Vitals  Enc Vitals Group     BP 09/29/22 1556 99/65     Pulse Rate 09/29/22 1556 86     Resp 09/29/22 1556 18     Temp 09/29/22 1556 100 F (37.8 C)     Temp Source 09/29/22 1556 Oral     SpO2 09/29/22 1556 93 %     Weight 09/29/22 1556 152 lb (68.9 kg)     Height 09/29/22 1556 _0  (1.626 m)     Head Circumference --      Peak Flow --      Pain Score 09/29/22 1554 0     Pain Loc --      Pain Edu? --      Excl. in [place]? --    No data found.  Updated Vital Signs BP 106/73 (BP Location: Left Arm)   Pulse 80   Temp 100 F (37.8 C) (Oral)   Resp 18   Ht _1  (1.626 m)   Wt 152 lb (68.9 kg)   SpO2 95%   BMI 26.09 kg/m      Physical Exam Vitals and nursing note reviewed.  Constitutional:      General: She is not in acute distress.    Appearance: Normal appearance. She is not ill-appearing or toxic-appearing.  HENT:     Head: Normocephalic and atraumatic.     Nose: Congestion present.     Mouth/Throat:     Mouth: Mucous membranes are moist.     Pharynx: Oropharynx is clear.  Eyes:     General: No scleral icterus.       Right eye: No discharge.        Left eye: No discharge.     Conjunctiva/sclera: Conjunctivae normal.  Cardiovascular:     Rate and Rhythm: Normal rate and regular rhythm.     Heart sounds: Normal heart sounds.  Pulmonary:     Effort: Pulmonary effort is normal. No respiratory distress.     Breath sounds: Normal breath sounds.  Abdominal:     Palpations: Abdomen is soft.     Tenderness: There is no abdominal tenderness.  Musculoskeletal:     Cervical back: Neck supple.  Skin:  General: Skin is dry.  Neurological:      General: No focal deficit present.     Mental Status: She is alert. Mental status is at baseline.     Motor: No weakness.     Gait: Gait normal.  Psychiatric:        Mood and Affect: Mood normal.        Behavior: Behavior normal.        Thought Content: Thought content normal.      UC Treatments / Results  Labs (all labs ordered are listed, but only abnormal results are displayed) Labs Reviewed  RESP PANEL BY RT-PCR (FLU A&B, COVID) [id number]    EKG   Radiology No results found.  Procedures Procedures (including critical care time)  Medications Ordered in UC Medications - No data to display  Initial Impression / Assessment and Plan / UC Course  I have reviewed the triage vital signs and the nursing notes.  Pertinent labs & imaging results that were available during my care of the patient were reviewed by me and considered in my medical decision making (see chart for details).   66 year old female presenting for low-grade fever, nausea, diarrhea, and cough.  Temp is currently 100 degrees.  She is overall well-appearing.  On exam she has mild nasal congestion.  Throat is clear.  Chest auscultation heart regular rate rhythm.  Abdomen soft and nontender.  Respiratory panel obtained.  Advised patient we will contact her with results if positive.  Advised if she does not hear from [place], the testing is negative which likely has viral illness versus food poisoning but low suspicion for food poisoning as I would expect her to have more diarrhea and some vomiting.  We discussed supportive care.  I sent Zofran and advised her to continue the Coricidin HBP and increase rest and fluids.  If she is positive for COVID, we discussed current CDC guidelines, isolation protocol and ED precautions and she is open to taking the antiviral medication.  Advised her to follow-up as needed.  Positive COVID test.  Called to discuss results with patient.  Sent molnupiravir.  Final Clinical  Impressions(s) / UC Diagnoses   Final diagnoses:  Viral illness  Diarrhea, unspecified type  Nausea  Acute cough     Discharge Instructions      -We have tested you for flu and COVID.  We will call with any positives.  If you do have COVID need to isolate 5 days wear mask for 5 days.  Today would be day 1.  We can also send COVID antiviral medication.  If you have the flu, we can send Tamiflu medicine. - If you do not hear from [place], those tests are negative and you have another virus.  Supportive care encouraged with continuing Coricidin HBP and increasing rest and fluids.  I sent Zofran for your nausea. - Return if you have any uncontrolled fever, weakness, breathing difficulty     ED Prescriptions     Medication Sig Dispense Auth. Provider   ondansetron (ZOFRAN-ODT) 4 MG disintegrating tablet Take 1 tablet (4 mg total) by mouth every 8 (eight) hours as needed for nausea or vomiting. 12 tablet Gretta Cool      PDMP not reviewed this encounter.   Danton Clap, PA-C 09/29/22 1824

## 2022-09-29 NOTE — Discharge Instructions (Addendum)
-  We have tested you for flu and COVID.  We will call with any positives.  If you do have COVID need to isolate 5 days wear mask for 5 days.  Today would be day 1.  We can also send COVID antiviral medication.  If you have the flu, we can send Tamiflu medicine. - If you do not hear from [place], those tests are negative and you have another virus.  Supportive care encouraged with continuing Coricidin HBP and increasing rest and fluids.  I sent Zofran for your nausea. - Return if you have any uncontrolled fever, weakness, breathing difficulty

## 2022-09-29 NOTE — ED Triage Notes (Signed)
Pt c/o temperature of 100.4, diarrhea, dizziness, headaches, nausea x2days.  Pt took a home covid test and it was negative this morning.  Pt ate some old food and is unsure if she gave herself food poisoning.   Pt also used a new medication - fenugreek

## 2022-11-03 ENCOUNTER — Ambulatory Visit: Payer: Medicare Other

## 2022-11-09 NOTE — Therapy (Unsigned)
OUTPATIENT PHYSICAL THERAPY FOOT/ANKLE INITIAL EVALUATION  Patient Name: Cheyenne Bean MRN: [id number] DOB:Jan 20, 1956, 66 y.o., female Today's Date: 11/11/2022   PT End of Session - 11/10/22 1020     Visit Number 1    Number of Visits 9    Date for PT Re-Evaluation 01/05/23    Authorization Type eval: 11/10/2022    PT Start Time 1020    PT Stop Time 1100    PT Time Calculation (min) 40 min    Activity Tolerance Patient tolerated treatment well    Behavior During Therapy [facility] for tasks assessed/performed            Past Medical History:  Diagnosis Date   BRCA negative 01/2017   MyRisk neg   Family history of breast cancer    1/18 IBIS=10.7%   Family history of ovarian cancer    MyRisk neg   Genital herpes    Hyperlipidemia    Hypertension    IBS (irritable bowel syndrome) 02/20/2017   Sarcoidosis    Past Surgical History:  Procedure Laterality Date   BACK SURGERY     BUNIONECTOMY Left    COLONOSCOPY  2012   cleared for 10 yrs   COLONOSCOPY WITH PROPOFOL N/A 02/27/2017   Procedure: COLONOSCOPY WITH PROPOFOL;  Surgeon: Lollie Sails, MD;  Location: [facility] ENDOSCOPY;  Service: Endoscopy;  Laterality: N/A;   VAGINAL HYSTERECTOMY     Patient Active Problem List   Diagnosis Date Noted   Pure hypercholesterolemia 11/04/2016   Essential hypertension 09/18/2015   Hyperlipidemia 09/18/2015   Recurrent genital herpes simplex 09/18/2015    PCP: Juline Patch, MD  REFERRING PROVIDER: Lear Ng*  REFERRING DIAGNOSIS: 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, left lower limb, M21.42 (ICD-10-CM) - Flat foot (pes planus) (acquired), left foot, M76.829 (ICD-10-CM) - Posterior tibial tendinitis, unspecified leg  THERAPY DIAG: Pain in left foot  ONSET DATE: Posterior tibialis pain x 1 month, pain at surgical site since the surgery 03/05/2022;  FOLLOW UP APPT WITH PROVIDER: No  Precautions: None  Weight Bearing Restrictions: No   SUBJECTIVE:                                                                                                                                                                                          Chief Complaint: L foot pain  Pertinent History Pt reports she started having some L foot aching about 1 month ago. "I was at work and I could feel it start to hurt." She has also had persisted dorsal and ventral L forefoot pain since her neuroma surgery in March of this year. She continues  to report persistent weakness in flexion of digits 3-5 on left foot. Numbness has persisted near surgical site as well. Her typical footwear is running shoes with her custom orthotics. Pt reports that she is doing her exercises "maybe once per week." History from 07/07/22: Pt underwent L Morton's neuroma surgery on March 15th 2023. She presents today due to ongoing numbness on left forefoot, scar sensitivity, and navicular pain near poterior tibial insertion for the last 3 weeks. She was in a post-op shoe immediately after surgery but denies any WB restrictions. Pt has transitioned back to sneakers but states she hasn't been wearing her orthotics because she feels like they made her foot "too high" and they felt uncomfortable. She has not been performing her home exercises for her posterior tibial tendinitis since last discharge from therapy and did not restart when her symptoms recurred. She has been performing scar massage at home since her incision healed. Prior history from 01/15/21: Pt reports that approximately 1 year ago she started having L foot pain. She was wearing a lot of flats around that time when the pain started started. The pain improved after approximately 1 week without any intervention. She was visiting her dad in October 2021 and her foot pain flared up significantly again. She was unable ot put weight on her foot due to the pain and had to use crutches to help her walk. She went to the urgent care and was sent to general  orthopedics who recommended orthotics and gave her two steroid shots on her medial foot near her L posterior tibial tendon. She reports improvement for 1-2 days and then the pain returned. After returning to [place] she saw general orthopedics who placed a medial heel wedge in her shoes. He also prescribed her Voltaren gel for topical anti-inflammatory use to be used up to 4 times/day. 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 The pain is located on the medial aspect of her L foot at the insertion on the medial navicular and she describes it as "achy." No other recent changes in medication or health. History of chronic back pain with LLE radicular pain to the calf. Pt reports she has had back surgery without improvement but does not recall what type of surgery she had. Pt has a history of sarcoidosis but states that she had a breathing test yesterday which was good.   Pain:  Pain Intensity: Present: 0/10, Best: 0/10, Worst: 6/10 Pain location: Dorsal aspect of L forefoot between digits 3-4 over incision scar, under her foot near hear medial arch, and pain at insertion site of posterior tibialis tendon at the navicular. Pain quality: aching, tingling near the surgery, sensitive to touch; Radiating pain: No  Swelling: No Numbness/Tingling: Yes, tingling forefoot on plantar aspects of digits 3-4 as well as plantar aspect over met heads of digits 3-4; Focal weakness or buckling: No Aggravating factors: walking on harder surfaces, active inversion and eversion; spontaneous/unprovoked pain at the surgical site; Relieving factors: "nothing" 24-hour pain behavior: varies based on activity Dominant hand: right Prior level of function: Independent Occupational demands: Works at [employer] which involves extended periods in standing Hobbies: watching television; Red flags (chills/fever, night sweats, nausea, vomiting, unrelenting pain): Negative Patient Goals: Pt would like to be  able to work and socialize without an increase in her pain.   OBJECTIVE:   Patient Surveys  FOTO 47, predicted improvement to 12  MUSCULOSKELETAL: Tremor: Absent Bulk: Normal Tone: Normal, No trophic changes noted  to foot/ankle. No ecchymosis, erythema, or edema noted. No gross L ankle/foot deformity noted. Well healed incision between digits 3-4 on the forefoot dorsal aspect of the L foot  Lumbar/Hip/Knee Screen Deferred  Posture No gross abnormalities noted in seated posture. In sitting and standing posture pt with notable preservation of arch. She does tend to keep weight more on the lateral aspect of her L foot in standing however less so compared to when she was last evaluated. She also does have slightly more arch collapse in L foot compared to R foot but no excessive pes planus.  Gait Only mild pronation noted during gait bilaterally however not excessive and pt is able to maintain her arch with slightly more loss of arch in LLE compared to right.   Palpation Slight pain to palpation at the insertion of the posterior tibial tendon on the navicular but less compared to prior assessment. She denies any pain with palpation proximally along the rest of the L posterior tibial tendon or anywhere further up the leg. No pain to palpation along medial and lateral malleoli. She does have some pain with palpation to along plantar fascia. Achilles tendon intact and painless to palpation  Strength R/L 5/5 Hip flexion 5/5 Hip abduction (seated) 5/5 Hip adduction (seated) 5/5 Knee extension 5/5 Knee flexion (seated) 5/5 Ankle Plantarflexion 5/5 Ankle Dorsiflexion 5/5 Ankle Inversion 5/5* Ankle Eversion Decreased toe flexion and extension strength in digits 3-5 of L foot   AROM  AROM (Normal range in degrees) AROM     Right Left  Knee    Flexion (135) WNL WNL  Extension (0) WNL WNL      Ankle    Dorsiflexion (20), measured in standing 20 20  Plantarflexion (50) 55 55   Inversion (35) 30 28  Eversion (15) 20 14  (* = pain; Blank rows = not tested)  Passive Accessory Motion Deferred  NEUROLOGICAL:  Mental Status Patient is oriented to person, place and time.  Recent memory is intact.  Remote memory is intact.  Attention span and concentration are intact.  Expressive speech is intact.  Patient's fund of knowledge is within normal limits for educational level.  Sensation Deferred  Reflexes Deferred  VASCULAR Deferred  SPECIAL TESTS  Ligamentous Integrity Anterior Drawer (ATF, 10-15 plantarflexion with anterior translation): Negative Talar Tilt (CFL, inversion): Negative Eversion Stress Test (Deltoid, eversion): Negative External Rotation Test (High ankle, dorsiflexion and external rotation): Negative Squeeze Test (High ankle): Negative Impingment Sign (Dorsiflexion and eversion): Negative  Achilles Integrity Thompson Test: Negative  Fracture Screening Metatarsal Axial Loading: Negative Tap/Percussion Test: Not examined Vibration Test: Not examined  Pronation/Supination Navicular Drop: Negative  Nerve Test Tarsal Tunnel Test (maximal DF, EV, toe ext with tapping over tarsal tunnel): Negative Test for Morton's Neuroma (compress metatarsals and mobilize): Not examined  Other Windlass Mechanism Test: Not examined   Beighton Scale: Deferred   TODAY'S TREATMENT Deferred   PATIENT EDUCATION:  Education details: Plan of care, HEP, importance of consistency with her home program Person educated: Patient Education method: Explanation, handout Education comprehension: verbalized understanding   HOME EXERCISE PROGRAM: Access Code: [email or web address] URL: [email or web address] Date: 11/10/2022 Prepared by: Roxana Hires  Exercises - Seated Arch Lifts  - 1 x daily - 7 x weekly - 2 sets - 10 reps - 5s hold - Seated Toe Towel Scrunches  - 1 x daily - 7 x weekly - 2 sets - 10 reps - 5s hold - Isometric Ankle Inversion  at Wall  - 1  x daily - 7 x weekly - 2 sets - 5 reps - 10s hold - Seated Calf Raise With Small Ball at [place]  - 1 x daily - 7 x weekly - 2 sets - 10 reps - 5s hold - Seated Ankle Inversion Eversion PROM  - 1 x daily - 7 x weekly - 3 reps - 45s hold - Soleus Stretch on Wall  - 1 x daily - 7 x weekly - 3 reps - 45s hold - Gastroc Stretch on Wall  - 1 x daily - 7 x weekly - 3 reps - 45 hold - Towel Desensitization  - 7 x weekly - 3 reps - 60s hold  Patient Education - Scar Massage   ASSESSMENT:  CLINICAL IMPRESSION: Patient is a 66 y.o. female who was seen today for physical therapy evaluation and treatment of L forefoot and L posterior tibialis tendon pain. Pt has been seen multiple times for this issue and remain inconsistent/noncompliant with her home exercise program. Overall her posterior tibial pain appears less irritable than during prior episodes of care. Reissued HEP with addition of some additional exercises as well instruction to perform desensitization exercises over forefoot. Objective impairments include Abnormal gait, decreased strength, and pain. These impairments are limiting patient from cleaning and community activity. Personal factors including Age, Past/current experiences, and Time since onset of injury/illness/exacerbation are also affecting patient's functional outcome. Patient will benefit from skilled PT to address above impairments and improve overall function.  REHAB POTENTIAL: Good  CLINICAL DECISION MAKING: Stable/uncomplicated  EVALUATION COMPLEXITY: Low   GOALS: Goals reviewed with patient? No  SHORT TERM GOALS: Target date: 12/08/2022   Pt will be independent with HEP to improve strength and decrease foot/toe pain to improve pain-free function at home. Baseline:  Goal status: INITIAL   LONG TERM GOALS: Target date: 01/05/2023   Pt will increase FOTO to at least 58 to demonstrate significant improvement in function at home related to foot pain  Baseline:  11/10/22: 47 Goal status: INITIAL  2.  Pt will decrease worst foot/toe pain by at least 3 points on the NPRS in order to demonstrate clinically significant reduction in pain Baseline: 11/10/22: worst: 6/10 Goal status: INITIAL  3.  Pt will improve L ankle eversion range of motion to at least 20 degrees in order to improve gait mechanics and decrease pain;    Baseline: 11/10/22: 14 degrees Goal status: INITIAL  PLAN: PT FREQUENCY: 1x/wk  PT DURATION: 8 weeks  PLANNED INTERVENTIONS: Therapeutic exercises, Therapeutic activity, Neuromuscular re-education, Balance training, Gait training, Patient/Family education, Joint manipulation, Joint mobilization, Vestibular training, Canalith repositioning, Aquatic Therapy, Dry Needling, Electrical stimulation, Spinal manipulation, Spinal mobilization, Cryotherapy, Moist heat, scar mobilization, Taping, Traction, Ultrasound, Ionotophoresis 20m/ml Dexamethasone, and Manual therapy  PLAN FOR NEXT SESSION: review/modify HEP as needed, progress strengthening  JLyndel SafeHuprich PT, DPT, GCS  Dalya Maselli 11/11/2022, 1:05 PM

## 2022-11-10 ENCOUNTER — Ambulatory Visit: Payer: Medicare Other | Attending: Orthopedic Surgery

## 2022-11-10 DIAGNOSIS — M79672 Pain in left foot: Secondary | ICD-10-CM | POA: Diagnosis not present

## 2022-11-21 ENCOUNTER — Ambulatory Visit: Payer: Medicare Other | Attending: Orthopedic Surgery

## 2022-11-21 NOTE — Therapy (Incomplete)
OUTPATIENT PHYSICAL THERAPY FOOT/ANKLE TREATMENT  Patient Name: Cheyenne Bean MRN: [id number] DOB:May 25, 1956, 66 y.o., female Today's Date: 11/21/2022    Past Medical History:  Diagnosis Date   BRCA negative 01/2017   MyRisk neg   Family history of breast cancer    1/18 IBIS=10.7%   Family history of ovarian cancer    MyRisk neg   Genital herpes    Hyperlipidemia    Hypertension    IBS (irritable bowel syndrome) 02/20/2017   Sarcoidosis    Past Surgical History:  Procedure Laterality Date   BACK SURGERY     BUNIONECTOMY Left    COLONOSCOPY  2012   cleared for 10 yrs   COLONOSCOPY WITH PROPOFOL N/A 02/27/2017   Procedure: COLONOSCOPY WITH PROPOFOL;  Surgeon: Lollie Sails, MD;  Location: [facility] ENDOSCOPY;  Service: Endoscopy;  Laterality: N/A;   VAGINAL HYSTERECTOMY     Patient Active Problem List   Diagnosis Date Noted   Pure hypercholesterolemia 11/04/2016   Essential hypertension 09/18/2015   Hyperlipidemia 09/18/2015   Recurrent genital herpes simplex 09/18/2015    PCP: Juline Patch, MD  REFERRING PROVIDER: Lear Ng*  REFERRING DIAGNOSIS: 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, left lower limb, M21.42 (ICD-10-CM) - Flat foot (pes planus) (acquired), left foot, M76.829 (ICD-10-CM) - Posterior tibial tendinitis, unspecified leg  THERAPY DIAG: Pain in left foot  ONSET DATE: Posterior tibialis pain x 1 month, pain at surgical site since the surgery 03/05/2022;  FOLLOW UP APPT WITH PROVIDER: No  Precautions: None  Weight Bearing Restrictions: No  FROM INITIAL EVALUATION: SUBJECTIVE:                                                                                                                                                                                         Chief Complaint: L foot pain  Pertinent History Pt reports she started having some L foot aching about 1 month ago. "I was at work and I could feel it start to hurt."  She has also had persisted dorsal and ventral L forefoot pain since her neuroma surgery in March of this year. She continues to report persistent weakness in flexion of digits 3-5 on left foot. Numbness has persisted near surgical site as well. Her typical footwear is running shoes with her custom orthotics. Pt reports that she is doing her exercises "maybe once per week." History from 07/07/22: Pt underwent L Morton's neuroma surgery on March 15th 2023. She presents today due to ongoing numbness on left forefoot, scar sensitivity, and navicular pain near poterior tibial insertion for the last 3 weeks. She was in a post-op shoe immediately after  surgery but denies any WB restrictions. Pt has transitioned back to sneakers but states she hasn't been wearing her orthotics because she feels like they made her foot "too high" and they felt uncomfortable. She has not been performing her home exercises for her posterior tibial tendinitis since last discharge from therapy and did not restart when her symptoms recurred. She has been performing scar massage at home since her incision healed. Prior history from 01/15/21: Pt reports that approximately 1 year ago she started having L foot pain. She was wearing a lot of flats around that time when the pain started started. The pain improved after approximately 1 week without any intervention. She was visiting her dad in October 2021 and her foot pain flared up significantly again. She was unable ot put weight on her foot due to the pain and had to use crutches to help her walk. She went to the urgent care and was sent to general orthopedics who recommended orthotics and gave her two steroid shots on her medial foot near her L posterior tibial tendon. She reports improvement for 1-2 days and then the pain returned. After returning to [place] she saw general orthopedics who placed a medial heel wedge in her shoes. He also prescribed her Voltaren gel for topical anti-inflammatory use to be  used up to 4 times/day. 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 The pain is located on the medial aspect of her L foot at the insertion on the medial navicular and she describes it as "achy." No other recent changes in medication or health. History of chronic back pain with LLE radicular pain to the calf. Pt reports she has had back surgery without improvement but does not recall what type of surgery she had. Pt has a history of sarcoidosis but states that she had a breathing test yesterday which was good.   Pain:  Pain Intensity: Present: 0/10, Best: 0/10, Worst: 6/10 Pain location: Dorsal aspect of L forefoot between digits 3-4 over incision scar, under her foot near hear medial arch, and pain at insertion site of posterior tibialis tendon at the navicular. Pain quality: aching, tingling near the surgery, sensitive to touch; Radiating pain: No  Swelling: No Numbness/Tingling: Yes, tingling forefoot on plantar aspects of digits 3-4 as well as plantar aspect over met heads of digits 3-4; Focal weakness or buckling: No Aggravating factors: walking on harder surfaces, active inversion and eversion; spontaneous/unprovoked pain at the surgical site; Relieving factors: "nothing" 24-hour pain behavior: varies based on activity Dominant hand: right Prior level of function: Independent Occupational demands: Works at [employer] which involves extended periods in standing Hobbies: watching television; Red flags (chills/fever, night sweats, nausea, vomiting, unrelenting pain): Negative Patient Goals: Pt would like to be able to work and socialize without an increase in her pain.   OBJECTIVE:   Patient Surveys  FOTO 47, predicted improvement to 63  MUSCULOSKELETAL: Tremor: Absent Bulk: Normal Tone: Normal, No trophic changes noted to foot/ankle. No ecchymosis, erythema, or edema noted. No gross L ankle/foot deformity noted. Well healed incision between digits  3-4 on the forefoot dorsal aspect of the L foot  Lumbar/Hip/Knee Screen Deferred  Posture No gross abnormalities noted in seated posture. In sitting and standing posture pt with notable preservation of arch. She does tend to keep weight more on the lateral aspect of her L foot in standing however less so compared to when she was last evaluated. She also does have slightly more arch  collapse in L foot compared to R foot but no excessive pes planus.  Gait Only mild pronation noted during gait bilaterally however not excessive and pt is able to maintain her arch with slightly more loss of arch in LLE compared to right.   Palpation Slight pain to palpation at the insertion of the posterior tibial tendon on the navicular but less compared to prior assessment. She denies any pain with palpation proximally along the rest of the L posterior tibial tendon or anywhere further up the leg. No pain to palpation along medial and lateral malleoli. She does have some pain with palpation to along plantar fascia. Achilles tendon intact and painless to palpation  Strength R/L 5/5 Hip flexion 5/5 Hip abduction (seated) 5/5 Hip adduction (seated) 5/5 Knee extension 5/5 Knee flexion (seated) 5/5 Ankle Plantarflexion 5/5 Ankle Dorsiflexion 5/5 Ankle Inversion 5/5* Ankle Eversion Decreased toe flexion and extension strength in digits 3-5 of L foot   AROM  AROM (Normal range in degrees) AROM     Right Left  Knee    Flexion (135) WNL WNL  Extension (0) WNL WNL      Ankle    Dorsiflexion (20), measured in standing 20 20  Plantarflexion (50) 55 55  Inversion (35) 30 28  Eversion (15) 20 14  (* = pain; Blank rows = not tested)  Passive Accessory Motion Deferred  NEUROLOGICAL:  Mental Status Patient is oriented to person, place and time.  Recent memory is intact.  Remote memory is intact.  Attention span and concentration are intact.  Expressive speech is intact.  Patient's fund of knowledge  is within normal limits for educational level.  Sensation Deferred  Reflexes Deferred  VASCULAR Deferred  SPECIAL TESTS  Ligamentous Integrity Anterior Drawer (ATF, 10-15 plantarflexion with anterior translation): Negative Talar Tilt (CFL, inversion): Negative Eversion Stress Test (Deltoid, eversion): Negative External Rotation Test (High ankle, dorsiflexion and external rotation): Negative Squeeze Test (High ankle): Negative Impingment Sign (Dorsiflexion and eversion): Negative  Achilles Integrity Thompson Test: Negative  Fracture Screening Metatarsal Axial Loading: Negative Tap/Percussion Test: Not examined Vibration Test: Not examined  Pronation/Supination Navicular Drop: Negative  Nerve Test Tarsal Tunnel Test (maximal DF, EV, toe ext with tapping over tarsal tunnel): Negative Test for Morton's Neuroma (compress metatarsals and mobilize): Not examined  Other Windlass Mechanism Test: Not examined   Beighton Scale: Deferred   TODAY'S TREATMENT Deferred   PATIENT EDUCATION:  Education details: Plan of care, HEP, importance of consistency with her home program Person educated: Patient Education method: Explanation, handout Education comprehension: verbalized understanding   HOME EXERCISE PROGRAM: Access Code: [email or web address] URL: [email or web address] Date: 11/10/2022 Prepared by: Roxana Hires  Exercises - Seated Arch Lifts  - 1 x daily - 7 x weekly - 2 sets - 10 reps - 5s hold - Seated Toe Towel Scrunches  - 1 x daily - 7 x weekly - 2 sets - 10 reps - 5s hold - Isometric Ankle Inversion at Wall  - 1 x daily - 7 x weekly - 2 sets - 5 reps - 10s hold - Seated Calf Raise With Small Ball at Heels  - 1 x daily - 7 x weekly - 2 sets - 10 reps - 5s hold - Seated Ankle Inversion Eversion PROM  - 1 x daily - 7 x weekly - 3 reps - 45s hold - Soleus Stretch on Wall  - 1 x daily - 7 x weekly - 3 reps - 45s hold - Gastroc Stretch on Wall  -  1 x daily - 7  x weekly - 3 reps - 45 hold - Towel Desensitization  - 7 x weekly - 3 reps - 60s hold  Patient Education - Scar Massage   ASSESSMENT:  CLINICAL IMPRESSION: Patient is a 66 y.o. female who was seen today for physical therapy evaluation and treatment of L forefoot and L posterior tibialis tendon pain. Pt has been seen multiple times for this issue and remain inconsistent/noncompliant with her home exercise program. Overall her posterior tibial pain appears less irritable than during prior episodes of care. Reissued HEP with addition of some additional exercises as well instruction to perform desensitization exercises over forefoot. Objective impairments include Abnormal gait, decreased strength, and pain. These impairments are limiting patient from cleaning and community activity. Personal factors including Age, Past/current experiences, and Time since onset of injury/illness/exacerbation are also affecting patient's functional outcome. Patient will benefit from skilled PT to address above impairments and improve overall function.  REHAB POTENTIAL: Good  CLINICAL DECISION MAKING: Stable/uncomplicated  EVALUATION COMPLEXITY: Low   GOALS: Goals reviewed with patient? No  SHORT TERM GOALS: Target date: 12/08/2022   Pt will be independent with HEP to improve strength and decrease foot/toe pain to improve pain-free function at home. Baseline:  Goal status: INITIAL   LONG TERM GOALS: Target date: 01/05/2023   Pt will increase FOTO to at least 58 to demonstrate significant improvement in function at home related to foot pain  Baseline: 11/10/22: 47 Goal status: INITIAL  2.  Pt will decrease worst foot/toe pain by at least 3 points on the NPRS in order to demonstrate clinically significant reduction in pain Baseline: 11/10/22: worst: 6/10 Goal status: INITIAL  3.  Pt will improve L ankle eversion range of motion to at least 20 degrees in order to improve gait mechanics and decrease pain;     Baseline: 11/10/22: 14 degrees Goal status: INITIAL  PLAN: PT FREQUENCY: 1x/wk  PT DURATION: 8 weeks  PLANNED INTERVENTIONS: Therapeutic exercises, Therapeutic activity, Neuromuscular re-education, Balance training, Gait training, Patient/Family education, Joint manipulation, Joint mobilization, Vestibular training, Canalith repositioning, Aquatic Therapy, Dry Needling, Electrical stimulation, Spinal manipulation, Spinal mobilization, Cryotherapy, Moist heat, scar mobilization, Taping, Traction, Ultrasound, Ionotophoresis 70m/ml Dexamethasone, and Manual therapy  PLAN FOR NEXT SESSION: review/modify HEP as needed, progress strengthening  JLyndel SafeHuprich PT, DPT, GCS  Cheyenne Bean 11/21/2022, 8:17 PM

## 2022-11-24 ENCOUNTER — Ambulatory Visit: Payer: Medicare Other

## 2022-11-24 DIAGNOSIS — M79672 Pain in left foot: Secondary | ICD-10-CM

## 2022-12-02 ENCOUNTER — Ambulatory Visit: Payer: Medicaid Other | Admitting: Family Medicine

## 2022-12-13 ENCOUNTER — Other Ambulatory Visit: Payer: Self-pay | Admitting: Family Medicine

## 2022-12-13 DIAGNOSIS — I10 Essential (primary) hypertension: Secondary | ICD-10-CM

## 2022-12-26 DIAGNOSIS — K080 Exfoliation of teeth due to systemic causes: Secondary | ICD-10-CM | POA: Diagnosis not present

## 2022-12-29 ENCOUNTER — Telehealth: Payer: Self-pay | Admitting: Family Medicine

## 2022-12-29 ENCOUNTER — Encounter: Payer: Self-pay | Admitting: Family Medicine

## 2022-12-29 ENCOUNTER — Ambulatory Visit (INDEPENDENT_AMBULATORY_CARE_PROVIDER_SITE_OTHER): Payer: Medicare Other | Admitting: Family Medicine

## 2022-12-29 VITALS — BP 120/78 | HR 87 | Ht 64.0 in | Wt 156.0 lb

## 2022-12-29 DIAGNOSIS — J069 Acute upper respiratory infection, unspecified: Secondary | ICD-10-CM

## 2022-12-29 DIAGNOSIS — A6000 Herpesviral infection of urogenital system, unspecified: Secondary | ICD-10-CM | POA: Diagnosis not present

## 2022-12-29 DIAGNOSIS — Z23 Encounter for immunization: Secondary | ICD-10-CM | POA: Diagnosis not present

## 2022-12-29 DIAGNOSIS — E7800 Pure hypercholesterolemia, unspecified: Secondary | ICD-10-CM | POA: Diagnosis not present

## 2022-12-29 DIAGNOSIS — I10 Essential (primary) hypertension: Secondary | ICD-10-CM | POA: Diagnosis not present

## 2022-12-29 DIAGNOSIS — Z86010 Personal history of colonic polyps: Secondary | ICD-10-CM | POA: Diagnosis not present

## 2022-12-29 MED ORDER — VALACYCLOVIR HCL 500 MG PO TABS: 500.0000 mg | ORAL_TABLET | Freq: Every day | ORAL | 1 refills | Status: DC

## 2022-12-29 MED ORDER — AMLODIPINE BESYLATE 5 MG PO TABS: ORAL_TABLET | ORAL | 1 refills | Status: DC

## 2022-12-29 MED ORDER — PRAVASTATIN SODIUM 20 MG PO TABS: 20.0000 mg | ORAL_TABLET | Freq: Every day | ORAL | 1 refills | Status: DC

## 2022-12-29 NOTE — Telephone Encounter (Signed)
Copied from [place] [id number]. Topic: General - Other >> Dec 29, 2022 10:05 AM Everette C wrote: Reason for CRM: The patient shares that during their visit with Dr. Ronnald Ramp today 12/29/22 they discussed taking an over the counter nasal spray  The patient is unable to remember the name of the medication recommended and would like the name again when possible  Please contact further when available

## 2022-12-29 NOTE — Patient Instructions (Signed)
..  mmc

## 2022-12-29 NOTE — Progress Notes (Signed)
Date:  12/29/2022   Name:  Cheyenne Bean   DOB:  1956/10/23   MRN:  [id number]   Chief Complaint: Hyperlipidemia, Hypertension, viral syndrome, and Flu Vaccine  Hyperlipidemia This is a chronic problem. The current episode started more than 1 year ago. The problem is controlled. Recent lipid tests were reviewed and are normal. She has no history of chronic renal disease or diabetes. Pertinent negatives include no chest pain, focal weakness, myalgias or shortness of breath. Current antihyperlipidemic treatment includes statins. The current treatment provides moderate improvement of lipids. There are no compliance problems.   Hypertension This is a chronic problem. The current episode started more than 1 year ago. The problem has been gradually improving since onset. The problem is controlled. Pertinent negatives include no blurred vision, chest pain, headaches, orthopnea, palpitations, PND or shortness of breath. Past treatments include calcium channel blockers. There are no compliance problems.  There is no history of chronic renal disease.  URI  This is a new problem. The current episode started in the past 7 days. Associated symptoms include congestion, rhinorrhea and sneezing. Pertinent negatives include no abdominal pain, chest pain, coughing, headaches, plugged ear sensation, sore throat or wheezing. Associated symptoms comments: Yellow nasal discharge.    Lab Results  Component Value Date   NA 144 06/02/2022   K 4.0 06/02/2022   CO2 22 06/02/2022   GLUCOSE 93 06/02/2022   BUN 16 06/02/2022   CREATININE 0.71 06/02/2022   CALCIUM 9.6 06/02/2022   EGFR 95 06/02/2022   GFRNONAA >60 12/13/2020   Lab Results  Component Value Date   CHOL 153 06/02/2022   HDL 54 06/02/2022   LDLCALC 89 06/02/2022   TRIG 45 06/02/2022   CHOLHDL 3.2 03/26/2018   No results found for: "TSH" No results found for: "HGBA1C" Lab Results  Component Value Date   WBC 5.2 12/27/2021   HGB 14.0  12/27/2021   HCT 41.3 12/27/2021   MCV 87 12/27/2021   PLT 236 12/27/2021   Lab Results  Component Value Date   ALT 19 06/02/2022   AST 18 06/02/2022   ALKPHOS 126 (H) 06/02/2022   BILITOT 0.3 12/27/2021   No results found for: "25OHVITD2", "25OHVITD3", "VD25OH"   Review of Systems  Constitutional:  Negative for chills and fever.  HENT:  Positive for congestion, rhinorrhea and sneezing. Negative for sinus pressure and sore throat.   Eyes:  Negative for blurred vision and visual disturbance.  Respiratory:  Negative for cough, chest tightness, shortness of breath and wheezing.   Cardiovascular:  Negative for chest pain, palpitations, orthopnea, leg swelling and PND.  Gastrointestinal:  Negative for abdominal pain.  Musculoskeletal:  Negative for myalgias.  Neurological:  Negative for focal weakness and headaches.    Patient Active Problem List   Diagnosis Date Noted   Pure hypercholesterolemia 11/04/2016   Essential hypertension 09/18/2015   Hyperlipidemia 09/18/2015   Recurrent genital herpes simplex 09/18/2015    No Known Allergies  Past Surgical History:  Procedure Laterality Date   BACK SURGERY     BUNIONECTOMY Left    COLONOSCOPY  2012   cleared for 10 yrs   COLONOSCOPY WITH PROPOFOL N/A 02/27/2017   Procedure: COLONOSCOPY WITH PROPOFOL;  Surgeon: Christena Deem, MD;  [facility] ENDOSCOPY;  Service: Endoscopy;  Laterality: N/A;   VAGINAL HYSTERECTOMY      Social History   Tobacco Use   Smoking status: Never   Smokeless tobacco: Never  Vaping Use  Vaping Use: Never used  Substance Use Topics   Alcohol use: No    Alcohol/week: 0.0 standard drinks of alcohol   Drug use: No     Medication list has been reviewed and updated.  Current Meds  Medication Sig   amLODipine (NORVASC) 5 MG tablet TAKE 1 & 1/2 (ONE & ONE-HALF) TABLETS BY MOUTH ONCE DAILY = 7.5MG /KOWALSKI   ibuprofen (ADVIL) 600 MG tablet Take 1 tablet (600 mg total) by mouth every 6 (six)  hours as needed.   meclizine (ANTIVERT) 25 MG tablet Take 1 tablet (25 mg total) by mouth as needed for dizziness.   pravastatin (PRAVACHOL) 20 MG tablet Take 1 tablet (20 mg total) by mouth daily.   valACYclovir (VALTREX) 500 MG tablet Take 1 tablet (500 mg total) by mouth daily.   [DISCONTINUED] ondansetron (ZOFRAN-ODT) 4 MG disintegrating tablet Take 1 tablet (4 mg total) by mouth every 8 (eight) hours as needed for nausea or vomiting.       12/29/2022    8:21 AM 06/02/2022    8:16 AM 01/27/2022    1:34 PM 10/14/2021   11:09 AM  GAD 7 : Generalized Anxiety Score  Nervous, Anxious, on Edge 0 0 1 0  Control/stop worrying 0 0 1 0  Worry too much - different things 0 0 2 0  Trouble relaxing 0 0 1 0  Restless 0 0 3 0  Easily annoyed or irritable 0 0 1 0  Afraid - awful might happen 0 0 0 0  Total GAD 7 Score 0 0 9 0  Anxiety Difficulty Not difficult at all Not difficult at all Very difficult        12/29/2022    8:21 AM 06/02/2022    8:16 AM 01/27/2022    1:33 PM  Depression screen PHQ 2/9  Decreased Interest 0 0 0  Down, Depressed, Hopeless 0 0 0  PHQ - 2 Score 0 0 0  Altered sleeping 0 0 0  Tired, decreased energy 0 0 1  Change in appetite 0 0 0  Feeling bad or failure about yourself  0 0 0  Trouble concentrating 0 0 0  Moving slowly or fidgety/restless 0 0 0  Suicidal thoughts 0 0 0  PHQ-9 Score 0 0 1  Difficult doing work/chores Not difficult at all Not difficult at all Somewhat difficult    BP Readings from Last 3 Encounters:  12/29/22 120/78  09/29/22 106/73  06/02/22 120/80    Physical Exam Vitals and nursing note reviewed. Exam conducted with a chaperone present.  Constitutional:      General: She is not in acute distress.    Appearance: She is not diaphoretic.  HENT:     Head: Normocephalic and atraumatic.     Right Ear: External ear normal.     Left Ear: External ear normal.     Nose: Nose normal.  Eyes:     General:        Right eye: No discharge.         Left eye: No discharge.     Conjunctiva/sclera: Conjunctivae normal.     Pupils: Pupils are equal, round, and reactive to light.  Neck:     Thyroid: No thyromegaly.     Vascular: No JVD.  Cardiovascular:     Rate and Rhythm: Normal rate and regular rhythm.     Heart sounds: Normal heart sounds and S1 normal. No murmur heard.    No systolic murmur is present.  No diastolic murmur is present.     No friction rub. No gallop. No S3 or S4 sounds.  Pulmonary:     Effort: Pulmonary effort is normal.     Breath sounds: Normal breath sounds. No decreased breath sounds, wheezing, rhonchi or rales.  Abdominal:     General: Bowel sounds are normal.     Palpations: Abdomen is soft. There is no mass.     Tenderness: There is no abdominal tenderness. There is no guarding.  Musculoskeletal:        General: Normal range of motion.     Cervical back: Neck supple.  Lymphadenopathy:     Cervical: No cervical adenopathy.  Skin:    General: Skin is warm and dry.  Neurological:     Mental Status: She is alert.     Wt Readings from Last 3 Encounters:  12/29/22 156 lb (70.8 kg)  09/29/22 152 lb (68.9 kg)  06/02/22 146 lb (66.2 kg)    BP 120/78   Pulse 87   Ht 5\' 4"  (1.626 m)   Wt 156 lb (70.8 kg)   SpO2 99%   BMI 26.78 kg/m   Assessment and Plan:  1. Essential hypertension Chronic.  Controlled.  Stable.  Blood pressure today is 120/78.  Asymptomatic.  Tolerating medication well.  Continue amlodipine 5 mg once a day.  Will check renal function panel for GFR and electrolytes.  Will recheck in 6 months. - amLODipine (NORVASC) 5 MG tablet; TAKE 1 & 1/2 (ONE & ONE-HALF) TABLETS BY MOUTH ONCE DAILY = 7.5MG   Dispense: 135 tablet; Refill: 1 - Renal Function Panel  2. Pure hypercholesterolemia Monic.  Controlled.  Stable.  Tolerating pravastatin 20 mg once a day and will continue with this.  Will recheck in 6 months. - pravastatin (PRAVACHOL) 20 MG tablet; Take 1 tablet (20 mg total) by mouth  daily.  Dispense: 90 tablet; Refill: 1  3. Viral URI Viral URI with slight coloration of nasal discharge.  No tenderness over maxillary sinuses and is likely a viral URI for which we have suggested Nasacort HQ over-the-counter.  4. Recurrent genital herpes simplex Chronic.  Controlled.  Stable.  Continue valacyclovir 1 a day for suppression of viral syndrome. - valACYclovir (VALTREX) 500 MG tablet; Take 1 tablet (500 mg total) by mouth daily.  Dispense: 90 tablet; Refill: 1  5. Need for immunization against influenza Discussed and administered - Flu Vaccine QUAD 58mo+IM (Fluarix, Fluzone & Alfiuria Quad PF)    Otilio Miu, MD

## 2022-12-30 LAB — RENAL FUNCTION PANEL
Albumin: 4.7 g/dL (ref 3.9–4.9)
BUN/Creatinine Ratio: 19 (ref 12–28)
BUN: 14 mg/dL (ref 8–27)
CO2: 24 mmol/L (ref 20–29)
Calcium: 9.7 mg/dL (ref 8.7–10.3)
Chloride: 103 mmol/L (ref 96–106)
Creatinine, Ser: 0.74 mg/dL (ref 0.57–1.00)
Glucose: 87 mg/dL (ref 70–99)
Phosphorus: 4.1 mg/dL (ref 3.0–4.3)
Potassium: 4.3 mmol/L (ref 3.5–5.2)
Sodium: 142 mmol/L (ref 134–144)
eGFR: 90 mL/min/{1.73_m2} (ref >59)

## 2023-01-30 DIAGNOSIS — R0602 Shortness of breath: Secondary | ICD-10-CM | POA: Diagnosis not present

## 2023-01-30 DIAGNOSIS — D860 Sarcoidosis of lung: Secondary | ICD-10-CM | POA: Diagnosis not present

## 2023-01-30 DIAGNOSIS — D869 Sarcoidosis, unspecified: Secondary | ICD-10-CM | POA: Diagnosis not present

## 2023-03-06 ENCOUNTER — Telehealth: Payer: Self-pay | Admitting: Family Medicine

## 2023-03-06 NOTE — Telephone Encounter (Signed)
Contacted Cheyenne Bean to schedule their annual wellness visit. Appointment made for 04/09/2023.  Sherol Dade; Care Guide [facility] Direct Dial: [phone]

## 2023-03-25 ENCOUNTER — Telehealth: Payer: Self-pay

## 2023-03-25 NOTE — Telephone Encounter (Signed)
Called pt to see about the right sided pain- she is on schedule for tomorrow. If she still has appendix, may need to go to ER setting for further evaluation for appendicitis. She also sees GI and has colonoscopy scheduled for 03/30/23. She may reach out to them and see if they have any suggestions. I could not leave a message as mailbox is full

## 2023-03-26 ENCOUNTER — Ambulatory Visit
Admission: RE | Admit: 2023-03-26 | Discharge: 2023-03-26 | Disposition: A | Discharge status: Home / Self Care | Payer: Medicare Other | Source: Ambulatory Visit | Attending: Family Medicine | Admitting: Family Medicine

## 2023-03-26 ENCOUNTER — Ambulatory Visit
Admission: RE | Admit: 2023-03-26 | Discharge: 2023-03-26 | Disposition: A | Discharge status: Home / Self Care | Payer: Medicare Other | Attending: Family Medicine | Admitting: Family Medicine

## 2023-03-26 ENCOUNTER — Encounter: Payer: Self-pay | Admitting: Family Medicine

## 2023-03-26 ENCOUNTER — Ambulatory Visit (INDEPENDENT_AMBULATORY_CARE_PROVIDER_SITE_OTHER): Payer: Medicare Other | Admitting: Family Medicine

## 2023-03-26 VITALS — BP 126/74 | HR 63 | Ht 64.0 in | Wt 149.0 lb

## 2023-03-26 DIAGNOSIS — M545 Low back pain, unspecified: Secondary | ICD-10-CM | POA: Diagnosis not present

## 2023-03-26 DIAGNOSIS — R109 Unspecified abdominal pain: Secondary | ICD-10-CM | POA: Diagnosis not present

## 2023-03-26 DIAGNOSIS — M546 Pain in thoracic spine: Secondary | ICD-10-CM | POA: Diagnosis not present

## 2023-03-26 LAB — POCT URINALYSIS DIPSTICK
Bilirubin, UA: NEGATIVE
Blood, UA: NEGATIVE
Glucose, UA: NEGATIVE
Ketones, UA: NEGATIVE
Leukocytes, UA: NEGATIVE
Nitrite, UA: NEGATIVE
Protein, UA: NEGATIVE
Spec Grav, UA: >=1.03 — AB (ref 1.010–1.025)
Urobilinogen, UA: 0.2 E.U./dL
pH, UA: 7 (ref 5.0–8.0)

## 2023-03-26 MED ORDER — MELOXICAM 7.5 MG PO TABS: 7.5000 mg | ORAL_TABLET | Freq: Every day | ORAL | 0 refills | Status: DC

## 2023-03-26 MED ORDER — CYCLOBENZAPRINE HCL 5 MG PO TABS: 5.0000 mg | ORAL_TABLET | Freq: Three times a day (TID) | ORAL | 1 refills | Status: DC | PRN
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
## 2023-03-30 ENCOUNTER — Ambulatory Visit
Admission: RE | Admit: 2023-03-30 | Discharge: 2023-03-30 | Disposition: A | Discharge status: Home / Self Care | Payer: Medicare Other | Attending: Gastroenterology | Admitting: Gastroenterology

## 2023-03-30 ENCOUNTER — Ambulatory Visit: Payer: Medicare Other | Admitting: Anesthesiology

## 2023-03-30 ENCOUNTER — Encounter
Admission: RE | Disposition: A | Discharge status: Home / Self Care | Payer: Self-pay | Source: Home / Self Care | Attending: Gastroenterology

## 2023-03-30 ENCOUNTER — Other Ambulatory Visit: Payer: Self-pay

## 2023-03-30 ENCOUNTER — Encounter: Payer: Self-pay | Admitting: *Deleted

## 2023-03-30 DIAGNOSIS — K635 Polyp of colon: Secondary | ICD-10-CM | POA: Diagnosis not present

## 2023-03-30 DIAGNOSIS — I10 Essential (primary) hypertension: Secondary | ICD-10-CM | POA: Diagnosis not present

## 2023-03-30 DIAGNOSIS — D869 Sarcoidosis, unspecified: Secondary | ICD-10-CM | POA: Diagnosis not present

## 2023-03-30 DIAGNOSIS — Z1211 Encounter for screening for malignant neoplasm of colon: Secondary | ICD-10-CM | POA: Insufficient documentation

## 2023-03-30 DIAGNOSIS — K640 First degree hemorrhoids: Secondary | ICD-10-CM | POA: Diagnosis not present

## 2023-03-30 DIAGNOSIS — Z79899 Other long term (current) drug therapy: Secondary | ICD-10-CM | POA: Diagnosis not present

## 2023-03-30 DIAGNOSIS — Z86010 Personal history of colonic polyps: Secondary | ICD-10-CM | POA: Diagnosis not present

## 2023-03-30 DIAGNOSIS — E785 Hyperlipidemia, unspecified: Secondary | ICD-10-CM | POA: Insufficient documentation

## 2023-03-30 DIAGNOSIS — D123 Benign neoplasm of transverse colon: Secondary | ICD-10-CM | POA: Diagnosis not present

## 2023-03-30 DIAGNOSIS — K649 Unspecified hemorrhoids: Secondary | ICD-10-CM | POA: Diagnosis not present

## 2023-03-30 DIAGNOSIS — D126 Benign neoplasm of colon, unspecified: Secondary | ICD-10-CM | POA: Diagnosis not present

## 2023-03-30 HISTORY — PX: COLONOSCOPY WITH PROPOFOL: SHX5780

## 2023-03-30 LAB — HM COLONOSCOPY

## 2023-03-30 SURGERY — COLONOSCOPY WITH PROPOFOL
Anesthesia: General

## 2023-03-30 MED ORDER — PROPOFOL 10 MG/ML IV BOLUS
INTRAVENOUS | Status: AC
  Filled 2023-03-30: qty 20

## 2023-03-30 MED ORDER — PROPOFOL 500 MG/50ML IV EMUL
INTRAVENOUS | Status: DC | PRN
  Administered 2023-03-30: 150 ug/kg/min via INTRAVENOUS

## 2023-03-30 MED ORDER — SODIUM CHLORIDE 0.9 % IV SOLN: INTRAVENOUS | Status: DC

## 2023-03-30 MED ORDER — SIMETHICONE 40 MG/0.6ML PO SUSP
ORAL | Status: DC | PRN
  Administered 2023-03-30: 60 mL

## 2023-03-30 NOTE — H&P (Signed)
Outpatient short stay form Pre-procedure 03/30/2023  Cheyenne Bill, MD  Primary Physician: Duanne Limerick, MD  Reason for visit:  Surveillance  History of present illness:    67 y/o lady with history of hypertension, constipation, and sarcoidosis here for surveillance colonoscopy. Last colonoscopy in 2018 was unremarkable but in 2013 had 10 mm polyp. No blood thinners. No known family history of GI malignancies. No significant abdominal surgeries.    Current Facility-Administered Medications:    0.9 %  sodium chloride infusion, , Intravenous, Continuous, Myrtha Tonkovich, Rossie Muskrat, MD, Last Rate: 20 mL/hr at 03/30/23 1208, Continued from Pre-op at 03/30/23 1208  Medications Prior to Admission  Medication Sig Dispense Refill Last Dose   amLODipine (NORVASC) 5 MG tablet TAKE 1 & 1/2 (ONE & ONE-HALF) TABLETS BY MOUTH ONCE DAILY = 7.5MG  135 tablet 1 03/30/2023 at 0500   Ascorbic Acid (VITA-C PO) Take by mouth.   Past Week   BIOTIN PO Take by mouth.   Past Week   Cyanocobalamin (VITAMIN B-12 PO) Take by mouth.   Past Week   ibuprofen (ADVIL) 600 MG tablet Take 1 tablet (600 mg total) by mouth every 6 (six) hours as needed. 30 tablet 0 Past Week   meclizine (ANTIVERT) 25 MG tablet Take 1 tablet (25 mg total) by mouth as needed for dizziness. 30 tablet 0 Past Week   meloxicam (MOBIC) 7.5 MG tablet Take 1 tablet (7.5 mg total) by mouth daily. 30 tablet 0 Past Week   Multiple Vitamin (MULTIVITAMIN) tablet Take 1 tablet by mouth daily.   Past Week   pravastatin (PRAVACHOL) 20 MG tablet Take 1 tablet (20 mg total) by mouth daily. 90 tablet 1 Past Week   valACYclovir (VALTREX) 500 MG tablet Take 1 tablet (500 mg total) by mouth daily. 90 tablet 1 Past Week   VITAMIN D PO Take by mouth.   Past Week   VITAMIN E PO Take by mouth.   Past Week   cyclobenzaprine (FLEXERIL) 5 MG tablet Take 1 tablet (5 mg total) by mouth 3 (three) times daily as needed for muscle spasms. 30 tablet 1      No Known  Allergies   Past Medical History:  Diagnosis Date   BRCA negative 01/2017   MyRisk neg   Family history of breast cancer    1/18 IBIS=10.7%   Family history of ovarian cancer    MyRisk neg   Genital herpes    Hyperlipidemia    Hypertension    IBS (irritable bowel syndrome) 02/20/2017   Sarcoidosis     Review of systems:  Otherwise negative.    Physical Exam  Gen: Alert, oriented. Appears stated age.  HEENT: PERRLA. Lungs: No respiratory distress CV: RRR Abd: soft, benign, no masses Ext: No edema    Planned procedures: Proceed with colonoscopy. The patient understands the nature of the planned procedure, indications, risks, alternatives and potential complications including but not limited to bleeding, infection, perforation, damage to internal organs and possible oversedation/side effects from anesthesia. The patient agrees and gives consent to proceed.  Please refer to procedure notes for findings, recommendations and patient disposition/instructions.     Cheyenne Bill, MD [facility] Gastroenterology

## 2023-03-30 NOTE — Transfer of Care (Signed)
Immediate Anesthesia Transfer of Care Note  Patient: Cheyenne Bean  Procedure(s) Performed: COLONOSCOPY WITH PROPOFOL  Patient Location: PACU  Anesthesia Type:General  Level of Consciousness: awake and sedated  Airway & Oxygen Therapy: Patient Spontanous Breathing and Patient connected to nasal cannula oxygen  Post-op Assessment: Report given to RN and Post -op Vital signs reviewed and stable  Post vital signs: Reviewed and stable  Last Vitals:  Vitals Value Taken Time  BP    Temp    Pulse    Resp    SpO2      Last Pain:  Vitals:   03/30/23 1148  TempSrc: Temporal  PainSc: 0-No pain         Complications: There were no known notable events for this encounter.

## 2023-03-30 NOTE — Anesthesia Postprocedure Evaluation (Signed)
Anesthesia Post Note  Patient: Britlee Easterbrook Baty  Procedure(s) Performed: COLONOSCOPY WITH PROPOFOL  Patient location during evaluation: Endoscopy Anesthesia Type: General Level of consciousness: awake and alert Pain management: pain level controlled Vital Signs Assessment: post-procedure vital signs reviewed and stable Respiratory status: spontaneous breathing, nonlabored ventilation, respiratory function stable and patient connected to nasal cannula oxygen Cardiovascular status: blood pressure returned to baseline and stable Postop Assessment: no apparent nausea or vomiting Anesthetic complications: no   There were no known notable events for this encounter.   Last Vitals:  Vitals:   03/30/23 1148 03/30/23 1238  BP: (!) 173/87 112/78  Pulse: 85 80  Resp: 16 18  Temp: (!) 36.4 C (!) 36.1 C  SpO2: 100% 100%    Last Pain:  Vitals:   03/30/23 1238  TempSrc: Temporal  PainSc:                  Louie Boston

## 2023-03-30 NOTE — Interval H&P Note (Signed)
History and Physical Interval Note:  03/30/2023 12:10 PM  Cheyenne Bean  has presented today for surgery, with the diagnosis of HX OF COLON POLYP.  The various methods of treatment have been discussed with the patient and family. After consideration of risks, benefits and other options for treatment, the patient has consented to  Procedure(s): COLONOSCOPY WITH PROPOFOL (N/A) as a surgical intervention.  The patient's history has been reviewed, patient examined, no change in status, stable for surgery.  I have reviewed the patient's chart and labs.  Questions were answered to the patient's satisfaction.     Regis Bill  Ok to proceed with colonoscopy

## 2023-03-30 NOTE — Anesthesia Preprocedure Evaluation (Signed)
Anesthesia Evaluation  Patient identified by MRN, date of birth, ID band Patient awake    Reviewed: Allergy & Precautions, NPO status , Patient's Chart, lab work & pertinent test results  History of Anesthesia Complications Negative for: history of anesthetic complications  Airway Mallampati: II  TM Distance: >3 FB Neck ROM: full    Dental no notable dental hx.    Pulmonary neg pulmonary ROS sarcoidosis   Pulmonary exam normal        Cardiovascular hypertension, On Medications Normal cardiovascular exam     Neuro/Psych negative neurological ROS  negative psych ROS   GI/Hepatic negative GI ROS, Neg liver ROS,,,  Endo/Other  negative endocrine ROS    Renal/GU negative Renal ROS  negative genitourinary   Musculoskeletal   Abdominal   Peds  Hematology negative hematology ROS (+)   Anesthesia Other Findings Past Medical History: 01/2017: BRCA negative     Comment:  MyRisk neg No date: Family history of breast cancer     Comment:  1/18 IBIS=10.7% No date: Family history of ovarian cancer     Comment:  MyRisk neg No date: Genital herpes No date: Hyperlipidemia No date: Hypertension 02/20/2017: IBS (irritable bowel syndrome) No date: Sarcoidosis  Past Surgical History: No date: BACK SURGERY No date: BUNIONECTOMY; Left 2012: COLONOSCOPY     Comment:  cleared for 10 yrs 02/27/2017: COLONOSCOPY WITH PROPOFOL; N/A     Comment:  Procedure: COLONOSCOPY WITH PROPOFOL;  Surgeon: Christena Deem, MD;  Location: [facility] ENDOSCOPY;  Service:               Endoscopy;  Laterality: N/A; No date: FOOT SURGERY; Left No date: VAGINAL HYSTERECTOMY  BMI    Body Mass Index: 24.89 kg/m      Reproductive/Obstetrics negative OB ROS                             Anesthesia Physical Anesthesia Plan  ASA: 2  Anesthesia Plan: General   Post-op Pain Management: Minimal or no pain  anticipated   Induction: Intravenous  PONV Risk Score and Plan: 1 and Propofol infusion and TIVA  Airway Management Planned: Natural Airway and Nasal Cannula  Additional Equipment:   Intra-op Plan:   Post-operative Plan:   Informed Consent: I have reviewed the patients History and Physical, chart, labs and discussed the procedure including the risks, benefits and alternatives for the proposed anesthesia with the patient or authorized representative who has indicated his/her understanding and acceptance.     Dental Advisory Given  Plan Discussed with: Anesthesiologist, CRNA and Surgeon  Anesthesia Plan Comments: (Patient consented for risks of anesthesia including but not limited to:  - adverse reactions to medications - risk of airway placement if required - damage to eyes, teeth, lips or other oral mucosa - nerve damage due to positioning  - sore throat or hoarseness - Damage to heart, brain, nerves, lungs, other parts of body or loss of life  Patient voiced understanding.)        Anesthesia Quick Evaluation

## 2023-03-30 NOTE — Op Note (Signed)
[facility] Gastroenterology Patient Name: Cheyenne Bean Procedure Date: 03/30/2023 12:07 PM MRN: [id number] Account #: [id number] Date of Birth: [birth year] Admit Type: Outpatient Age: 67 Room: [facility] ENDO ROOM 3 Gender: Female Note Status: Finalized Instrument Name: Prentice Docker [id number] Procedure:             Colonoscopy Indications:           Surveillance: Personal history of adenomatous polyps                         on last colonoscopy > 5 years ago Providers:             Eather Colas MD, MD Referring MD:          Duanne Limerick, MD (Referring MD) Medicines:             Monitored Anesthesia Care Complications:         No immediate complications. Estimated blood loss:                         Minimal. Procedure:             Pre-Anesthesia Assessment:                        - Prior to the procedure, a History and Physical was                         performed, and patient medications and allergies were                         reviewed. The patient is competent. The risks and                         benefits of the procedure and the sedation options and                         risks were discussed with the patient. All questions                         were answered and informed consent was obtained.                         Patient identification and proposed procedure were                         verified by the physician, the nurse, the                         anesthesiologist, the anesthetist and the technician                         in the endoscopy suite. Mental Status Examination:                         alert and oriented. Airway Examination: normal                         oropharyngeal airway and neck mobility. Respiratory  Examination: clear to auscultation. CV Examination:                         normal. Prophylactic Antibiotics: The patient does not                         require prophylactic antibiotics. Prior                          Anticoagulants: The patient has taken no anticoagulant                         or antiplatelet agents. ASA Grade Assessment: II - A                         patient with mild systemic disease. After reviewing                         the risks and benefits, the patient was deemed in                         satisfactory condition to undergo the procedure. The                         anesthesia plan was to use monitored anesthesia care                         (MAC). Immediately prior to administration of                         medications, the patient was re-assessed for adequacy                         to receive sedatives. The heart rate, respiratory                         rate, oxygen saturations, blood pressure, adequacy of                         pulmonary ventilation, and response to care were                         monitored throughout the procedure. The physical                         status of the patient was re-assessed after the                         procedure.                        After obtaining informed consent, the colonoscope was                         passed under direct vision. Throughout the procedure,                         the patient's blood pressure, pulse, and oxygen  saturations were monitored continuously. The                         Colonoscope was introduced through the anus and                         advanced to the the cecum, identified by appendiceal                         orifice and ileocecal valve. The colonoscopy was                         performed without difficulty. The patient tolerated                         the procedure well. The quality of the bowel                         preparation was good. The ileocecal valve, appendiceal                         orifice, and rectum were photographed. Findings:      The perianal and digital rectal examinations were normal.      A 4 mm polyp was found in the hepatic  flexure. The polyp was sessile.       The polyp was removed with a cold snare. Resection and retrieval were       complete. Estimated blood loss was minimal.      An 8 mm polyp was found in the proximal transverse colon. The polyp was       sessile. The polyp was removed with a cold snare. Resection and       retrieval were complete. Estimated blood loss was minimal.      Internal hemorrhoids were found during retroflexion. The hemorrhoids       were Grade I (internal hemorrhoids that do not prolapse).      The exam was otherwise without abnormality on direct and retroflexion       views. Impression:            - One 4 mm polyp at the hepatic flexure, removed with                         a cold snare. Resected and retrieved.                        - One 8 mm polyp in the proximal transverse colon,                         removed with a cold snare. Resected and retrieved.                        - Internal hemorrhoids.                        - The examination was otherwise normal on direct and                         retroflexion views. Recommendation:        - Discharge patient to home.                        -  Resume previous diet.                        - Continue present medications.                        - Await pathology results.                        - Repeat colonoscopy date to be determined after                         pending pathology results are reviewed for                         surveillance based on pathology results.                        - Return to referring physician as previously                         scheduled. Procedure Code(s):     --- Professional ---                        [id number], Colonoscopy, flexible; with removal of                         tumor(s), polyp(s), or other lesion(s) by snare                         technique Diagnosis Code(s):     --- Professional ---                        Z86.010, Personal history of colonic polyps                         D12.3, Benign neoplasm of transverse colon (hepatic                         flexure or splenic flexure)                        K64.0, First degree hemorrhoids CPT copyright 2022 [employer]. All rights reserved. The codes documented in this report are preliminary and upon coder review may  be revised to meet current compliance requirements. Eather Colasameron Starlin Steib MD, MD 03/30/2023 12:39:03 PM Number of Addenda: 0 Note Initiated On: 03/30/2023 12:07 PM Scope Withdrawal Time: 0 hours 10 minutes 19 seconds  Total Procedure Duration: 0 hours 16 minutes 54 seconds  Estimated Blood Loss:  Estimated blood loss was minimal.      [facility]

## 2023-03-31 ENCOUNTER — Encounter: Payer: Self-pay | Admitting: Gastroenterology

## 2023-03-31 LAB — SURGICAL PATHOLOGY

## 2023-04-09 ENCOUNTER — Ambulatory Visit (INDEPENDENT_AMBULATORY_CARE_PROVIDER_SITE_OTHER): Payer: Medicare Other

## 2023-04-09 VITALS — Ht 64.0 in | Wt 149.0 lb

## 2023-04-09 DIAGNOSIS — Z0000 Encounter for general adult medical examination without abnormal findings: Secondary | ICD-10-CM | POA: Diagnosis not present

## 2023-04-09 NOTE — Progress Notes (Signed)
I connected with  Cheyenne Bean on 04/09/23 by a audio enabled telemedicine application and verified that I am speaking with the correct person using two identifiers.  Patient Location: Home  Provider Location: Office/Clinic  I discussed the limitations of evaluation and management by telemedicine. The patient expressed understanding and agreed to proceed.  Subjective:   Cheyenne Bean is a 67 y.o. female who presents for Medicare Annual (Subsequent) preventive examination.  Review of Systems     Cardiac Risk Factors include: advanced age (>52men, >73 women);hypertension     Objective:    There were no vitals filed for this visit. There is no height or weight on file to calculate BMI.     04/09/2023    8:18 AM 03/30/2023   11:40 AM 09/29/2022    3:56 PM 02/02/2022    9:17 AM 04/30/2021    8:46 AM 04/27/2021    2:01 PM 12/13/2020    1:32 PM  Advanced Directives  Does Patient Have a Medical Advance Directive? No No No No No No No  Would patient like information on creating a medical advance directive? No - Patient declined No - Patient declined  No - Patient declined No - Patient declined No - Patient declined No - Patient declined    Current Medications (verified) Outpatient Encounter Medications as of 04/09/2023  Medication Sig   amLODipine (NORVASC) 5 MG tablet TAKE 1 & 1/2 (ONE & ONE-HALF) TABLETS BY MOUTH ONCE DAILY = 7.5MG    Ascorbic Acid (VITA-C PO) Take by mouth.   BIOTIN PO Take by mouth.   Cyanocobalamin (VITAMIN B-12 PO) Take by mouth.   cyclobenzaprine (FLEXERIL) 5 MG tablet Take 1 tablet (5 mg total) by mouth 3 (three) times daily as needed for muscle spasms.   ibuprofen (ADVIL) 600 MG tablet Take 1 tablet (600 mg total) by mouth every 6 (six) hours as needed.   meclizine (ANTIVERT) 25 MG tablet Take 1 tablet (25 mg total) by mouth as needed for dizziness.   meloxicam (MOBIC) 7.5 MG tablet Take 1 tablet (7.5 mg total) by mouth daily.   Multiple Vitamin  (MULTIVITAMIN) tablet Take 1 tablet by mouth daily.   pravastatin (PRAVACHOL) 20 MG tablet Take 1 tablet (20 mg total) by mouth daily.   valACYclovir (VALTREX) 500 MG tablet Take 1 tablet (500 mg total) by mouth daily.   VITAMIN D PO Take by mouth.   VITAMIN E PO Take by mouth.   No facility-administered encounter medications on file as of 04/09/2023.    Allergies (verified) Patient has no known allergies.   History: Past Medical History:  Diagnosis Date   BRCA negative 01/2017   MyRisk neg   Family history of breast cancer    1/18 IBIS=10.7%   Family history of ovarian cancer    MyRisk neg   Genital herpes    Hyperlipidemia    Hypertension    IBS (irritable bowel syndrome) 02/20/2017   Sarcoidosis    Past Surgical History:  Procedure Laterality Date   BACK SURGERY     BUNIONECTOMY Left    COLONOSCOPY  2012   cleared for 10 yrs   COLONOSCOPY WITH PROPOFOL N/A 02/27/2017   Procedure: COLONOSCOPY WITH PROPOFOL;  Surgeon: Christena Deem, MD;  Location: [facility] ENDOSCOPY;  Service: Endoscopy;  Laterality: N/A;   COLONOSCOPY WITH PROPOFOL N/A 03/30/2023   Procedure: COLONOSCOPY WITH PROPOFOL;  Surgeon: Regis Bill, MD;  Location: [facility] ENDOSCOPY;  Service: Endoscopy;  Laterality: N/A;   FOOT SURGERY  Left    VAGINAL HYSTERECTOMY     Family History  Problem Relation Age of Onset   Hypertension Mother    Breast cancer Sister 26   Cervical cancer Sister 36   Ovarian cancer Maternal Aunt 33   Colon cancer Maternal Uncle 33   Social History   Socioeconomic History   Marital status: Married    Spouse name: Not on file   Number of children: Not on file   Years of education: Not on file   Highest education level: Not on file  Occupational History   Not on file  Tobacco Use   Smoking status: Never   Smokeless tobacco: Never  Vaping Use   Vaping Use: Never used  Substance and Sexual Activity   Alcohol use: No    Alcohol/week: 0.0 standard drinks of alcohol   Drug  use: No   Sexual activity: Yes  Other Topics Concern   Not on file  Social History Narrative   Not on file   Social Determinants of Health   Financial Resource Strain: Low Risk  (04/09/2023)   Overall Financial Resource Strain (CARDIA)    Difficulty of Paying Living Expenses: Not hard at all  Food Insecurity: No Food Insecurity (04/09/2023)   Hunger Vital Sign    Worried About Running Out of Food in the Last Year: Never true    Ran Out of Food in the Last Year: Never true  Transportation Needs: No Transportation Needs (04/09/2023)   PRAPARE - Administrator, Civil Service (Medical): No    Lack of Transportation (Non-Medical): No  Physical Activity: Insufficiently Active (04/09/2023)   Exercise Vital Sign    Days of Exercise per Week: 3 days    Minutes of Exercise per Session: 30 min  Stress: No Stress Concern Present (04/09/2023)   [employer] of Occupational Health - Occupational Stress Questionnaire    Feeling of Stress : Only a little  Social Connections: Socially Isolated (04/09/2023)   Social Connection and Isolation Panel [NHANES]    Frequency of Communication with Friends and Family: More than three times a week    Frequency of Social Gatherings with Friends and Family: Once a week    Attends Religious Services: Never    Database administrator or Organizations: No    Attends Engineer, structural: Never    Marital Status: Separated    Tobacco Counseling Counseling given: Not Answered   Clinical Intake:  Pre-visit preparation completed: Yes  Pain : No/denies pain     Nutritional Risks: None Diabetes: No  How often do you need to have someone help you when you read instructions, pamphlets, or other written materials from your doctor or pharmacy?: 1 - Never  Diabetic?NO  Interpreter Needed?: No  Information entered by :: Kennedy Bucker, LPN   Activities of Daily Living    04/09/2023    8:19 AM  In your present state of health, do  you have any difficulty performing the following activities:  Hearing? 0  Vision? 0  Difficulty concentrating or making decisions? 0  Walking or climbing stairs? 1  Dressing or bathing? 0  Doing errands, shopping? 0  Preparing Food and eating ? N  Using the Toilet? N  In the past six months, have you accidently leaked urine? N  Do you have problems with loss of bowel control? N  Managing your Medications? N  Managing your Finances? N  Housekeeping or managing your Housekeeping? N    Patient  Care Team: Duanne Limerick, MD as PCP - General (Family Medicine)  Indicate any recent Medical Services you may have received from other than [facility] providers in the past year (date may be approximate).     Assessment:   This is a routine wellness examination for Hoang.  Hearing/Vision screen Hearing Screening - Comments:: NO AIDS Vision Screening - Comments:: WEARS GLASSES- [facility]  Dietary issues and exercise activities discussed: Current Exercise Habits: The patient has a physically strenuous job, but has no regular exercise apart from work., Time (Minutes): 30, Frequency (Times/Week): 3, Weekly Exercise (Minutes/Week): 90, Intensity: Mild   Goals Addressed             This Visit's Progress    DIET - EAT MORE FRUITS AND VEGETABLES         Depression Screen    04/09/2023    8:17 AM 03/26/2023   10:25 AM 12/29/2022    8:21 AM 06/02/2022    8:16 AM 01/27/2022    1:33 PM 10/14/2021   11:09 AM 03/06/2021   11:25 AM  PHQ 2/9 Scores  PHQ - 2 Score 0 0 0 0 0 0 0  PHQ- 9 Score 0 1 0 0 1 0 0    Fall Risk    04/09/2023    8:19 AM 03/26/2023   10:26 AM 12/29/2022    8:21 AM 11/28/2020   11:19 AM 04/03/2020   10:50 AM  Fall Risk   Falls in the past year? 0 0 0 0 1  Number falls in past yr: 0 0 0  0  Injury with Fall? 0 0 0  0  Risk for fall due to : No Fall Risks No Fall Risks No Fall Risks    Follow up Falls prevention discussed;Falls evaluation completed Falls evaluation completed  Falls evaluation completed Falls evaluation completed Falls evaluation completed    FALL RISK PREVENTION PERTAINING TO THE HOME:  Any stairs in or around the home? Yes  If so, are there any without handrails? No  Home free of loose throw rugs in walkways, pet beds, electrical cords, etc? Yes  Adequate lighting in your home to reduce risk of falls? Yes   ASSISTIVE DEVICES UTILIZED TO PREVENT FALLS:  Life alert? No  Use of a cane, walker or w/c? No  Grab bars in the bathroom? Yes  Shower chair or bench in shower? No  Elevated toilet seat or a handicapped toilet? No    Cognitive Function:        04/09/2023    8:23 AM  6CIT Screen  What Year? 0 points  What month? 0 points  What time? 0 points  Count back from 20 0 points  Months in reverse 0 points  Repeat phrase 2 points  Total Score 2 points    Immunizations Immunization History  Administered Date(s) Administered   Influenza,inj,Quad PF,6+ Mos 01/02/2017, 03/16/2018, 10/15/2018, 11/08/2019, 10/22/2020, 11/28/2020, 10/14/2021, 12/29/2022   Influenza-Unspecified 01/02/2017, 03/16/2018, 10/15/2018, 11/08/2019, 12/25/2022   PFIZER Comirnaty(Gray Top)Covid-19 Tri-Sucrose Vaccine 03/06/2020, 03/27/2020, 10/22/2020   PFIZER(Purple Top)SARS-COV-2 Vaccination 03/06/2020, 03/27/2020   Pneumococcal Polysaccharide-23 04/16/2010    TDAP status: Due, Education has been provided regarding the importance of this vaccine. Advised may receive this vaccine at local pharmacy or Health Dept. Aware to provide a copy of the vaccination record if obtained from local pharmacy or Health Dept. Verbalized acceptance and understanding.  Flu Vaccine status: Up to date  Pneumococcal vaccine status: Due, Education has been provided regarding the importance  of this vaccine. Advised may receive this vaccine at local pharmacy or Health Dept. Aware to provide a copy of the vaccination record if obtained from local pharmacy or Health Dept. Verbalized  acceptance and understanding.  Covid-19 vaccine status: Completed vaccines  Qualifies for Shingles Vaccine? Yes   Zostavax completed No   Shingrix Completed?: No.    Education has been provided regarding the importance of this vaccine. Patient has been advised to call insurance company to determine out of pocket expense if they have not yet received this vaccine. Advised may also receive vaccine at local pharmacy or Health Dept. Verbalized acceptance and understanding.  Screening Tests Health Maintenance  Topic Date Due   Zoster Vaccines- Shingrix (1 of 2) Never done   COVID-19 Vaccine (6 - 2023-24 season) 08/22/2022   INFLUENZA VACCINE  07/23/2023   Medicare Annual Wellness (AWV)  04/08/2024   MAMMOGRAM  09/29/2024   COLONOSCOPY (Pts 45-63yrs Insurance coverage will need to be confirmed)  03/29/2033   HPV VACCINES  Aged Out   DTaP/Tdap/Td  Discontinued   PAP SMEAR-Modifier  Discontinued   Hepatitis C Screening  Discontinued   HIV Screening  Discontinued    Health Maintenance  Health Maintenance Due  Topic Date Due   Zoster Vaccines- Shingrix (1 of 2) Never done   COVID-19 Vaccine (6 - 2023-24 season) 08/22/2022    Colorectal cancer screening: Type of screening: Colonoscopy. Completed 03/30/23. Repeat every 10 years  Mammogram status: Completed 09/29/22. Repeat every year   Lung Cancer Screening: (Low Dose CT Chest recommended if Age 23-80 years, 30 pack-year currently smoking OR have quit w/in 15years.) does not qualify.    Additional Screening:  Hepatitis C Screening: does qualify; Completed NO  Vision Screening: Recommended annual ophthalmology exams for early detection of glaucoma and other disorders of the eye. Is the patient up to date with their annual eye exam?  Yes  Who is the provider or what is the name of the office in which the patient attends annual eye exams? [facility] If pt is not established with a provider, would they like to be referred to a provider  to establish care? No .   Dental Screening: Recommended annual dental exams for proper oral hygiene  Community Resource Referral / Chronic Care Management: CRR required this visit?  No   CCM required this visit?  No      Plan:     I have personally reviewed and noted the following in the patient's chart:   Medical and social history Use of alcohol, tobacco or illicit drugs  Current medications and supplements including opioid prescriptions. Patient is not currently taking opioid prescriptions. Functional ability and status Nutritional status Physical activity Advanced directives List of other physicians Hospitalizations, surgeries, and ER visits in previous 12 months Vitals Screenings to include cognitive, depression, and falls Referrals and appointments  In addition, I have reviewed and discussed with patient certain preventive protocols, quality metrics, and best practice recommendations. A written personalized care plan for preventive services as well as general preventive health recommendations were provided to patient.     Hal Hope, LPN   [id number]/61/0960   Nurse Notes: [facility]

## 2023-04-09 NOTE — Patient Instructions (Signed)
Cheyenne Bean , Thank you for taking time to come for your Medicare Wellness Visit. I appreciate your ongoing commitment to your health goals. Please review the following plan we discussed and let me know if I can assist you in the future.   These are the goals we discussed:  Goals      DIET - EAT MORE FRUITS AND VEGETABLES        This is a list of the screening recommended for you and due dates:  Health Maintenance  Topic Date Due   Zoster (Shingles) Vaccine (1 of 2) Never done   COVID-19 Vaccine (6 - 2023-24 season) 08/22/2022   Flu Shot  07/23/2023   Medicare Annual Wellness Visit  04/08/2024   Mammogram  09/29/2024   Colon Cancer Screening  03/29/2033   HPV Vaccine  Aged Out   DTaP/Tdap/Td vaccine  Discontinued   Pap Smear  Discontinued   Hepatitis C Screening: USPSTF Recommendation to screen - Ages 86-79 yo.  Discontinued   HIV Screening  Discontinued    Advanced directives: NO  Conditions/risks identified: NONE  Next appointment: Follow up in one year for your annual wellness visit. 04/13/24 @ 8:15 AM BY PHONE  Preventive Care 40-64 Years, Female Preventive care refers to lifestyle choices and visits with your health care provider that can promote health and wellness. What does preventive care include? A yearly physical exam. This is also called an annual well check. Dental exams once or twice a year. Routine eye exams. Ask your health care provider how often you should have your eyes checked. Personal lifestyle choices, including: Daily care of your teeth and gums. Regular physical activity. Eating a healthy diet. Avoiding tobacco and drug use. Limiting alcohol use. Practicing safe sex. Taking low-dose aspirin daily starting at age 49. Taking vitamin and mineral supplements as recommended by your health care provider. What happens during an annual well check? The services and screenings done by your health care provider during your annual well check will depend on  your age, overall health, lifestyle risk factors, and family history of disease. Counseling  Your health care provider may ask you questions about your: Alcohol use. Tobacco use. Drug use. Emotional well-being. Home and relationship well-being. Sexual activity. Eating habits. Work and work Astronomer. Method of birth control. Menstrual cycle. Pregnancy history. Screening  You may have the following tests or measurements: Height, weight, and BMI. Blood pressure. Lipid and cholesterol levels. These may be checked every 5 years, or more frequently if you are over 80 years old. Skin check. Lung cancer screening. You may have this screening every year starting at age 2 if you have a 30-pack-year history of smoking and currently smoke or have quit within the past 15 years. Fecal occult blood test (FOBT) of the stool. You may have this test every year starting at age 64. Flexible sigmoidoscopy or colonoscopy. You may have a sigmoidoscopy every 5 years or a colonoscopy every 10 years starting at age 86. Hepatitis C blood test. Hepatitis B blood test. Sexually transmitted disease (STD) testing. Diabetes screening. This is done by checking your blood sugar (glucose) after you have not eaten for a while (fasting). You may have this done every 1-3 years. Mammogram. This may be done every 1-2 years. Talk to your health care provider about when you should start having regular mammograms. This may depend on whether you have a family history of breast cancer. BRCA-related cancer screening. This may be done if you have a family history  of breast, ovarian, tubal, or peritoneal cancers. Pelvic exam and Pap test. This may be done every 3 years starting at age 49. Starting at age 55, this may be done every 5 years if you have a Pap test in combination with an HPV test. Bone density scan. This is done to screen for osteoporosis. You may have this scan if you are at high risk for osteoporosis. Discuss your  test results, treatment options, and if necessary, the need for more tests with your health care provider. Vaccines  Your health care provider may recommend certain vaccines, such as: Influenza vaccine. This is recommended every year. Tetanus, diphtheria, and acellular pertussis (Tdap, Td) vaccine. You may need a Td booster every 10 years. Zoster vaccine. You may need this after age 23. Pneumococcal 13-valent conjugate (PCV13) vaccine. You may need this if you have certain conditions and were not previously vaccinated. Pneumococcal polysaccharide (PPSV23) vaccine. You may need one or two doses if you smoke cigarettes or if you have certain conditions. Talk to your health care provider about which screenings and vaccines you need and how often you need them. This information is not intended to replace advice given to you by your health care provider. Make sure you discuss any questions you have with your health care provider. Document Released: 01/04/2016 Document Revised: 08/27/2016 Document Reviewed: 10/09/2015 Elsevier Interactive Patient Education  2017 [place] Prevention in the Home Falls can cause injuries. They can happen to people of all ages. There are many things you can do to make your home safe and to help prevent falls. What can I do on the outside of my home? Regularly fix the edges of walkways and driveways and fix any cracks. Remove anything that might make you trip as you walk through a door, such as a raised step or threshold. Trim any bushes or trees on the path to your home. Use bright outdoor lighting. Clear any walking paths of anything that might make someone trip, such as rocks or tools. Regularly check to see if handrails are loose or broken. Make sure that both sides of any steps have handrails. Any raised decks and porches should have guardrails on the edges. Have any leaves, snow, or ice cleared regularly. Use sand or salt on walking paths during  winter. Clean up any spills in your garage right away. This includes oil or grease spills. What can I do in the bathroom? Use night lights. Install grab bars by the toilet and in the tub and shower. Do not use towel bars as grab bars. Use non-skid mats or decals in the tub or shower. If you need to sit down in the shower, use a plastic, non-slip stool. Keep the floor dry. Clean up any water that spills on the floor as soon as it happens. Remove soap buildup in the tub or shower regularly. Attach bath mats securely with double-sided non-slip rug tape. Do not have throw rugs and other things on the floor that can make you trip. What can I do in the bedroom? Use night lights. Make sure that you have a light by your bed that is easy to reach. Do not use any sheets or blankets that are too big for your bed. They should not hang down onto the floor. Have a firm chair that has side arms. You can use this for support while you get dressed. Do not have throw rugs and other things on the floor that can make you trip.  What can I do in the kitchen? Clean up any spills right away. Avoid walking on wet floors. Keep items that you use a lot in easy-to-reach places. If you need to reach something above you, use a strong step stool that has a grab bar. Keep electrical cords out of the way. Do not use floor polish or wax that makes floors slippery. If you must use wax, use non-skid floor wax. Do not have throw rugs and other things on the floor that can make you trip. What can I do with my stairs? Do not leave any items on the stairs. Make sure that there are handrails on both sides of the stairs and use them. Fix handrails that are broken or loose. Make sure that handrails are as long as the stairways. Check any carpeting to make sure that it is firmly attached to the stairs. Fix any carpet that is loose or worn. Avoid having throw rugs at the top or bottom of the stairs. If you do have throw rugs,  attach them to the floor with carpet tape. Make sure that you have a light switch at the top of the stairs and the bottom of the stairs. If you do not have them, ask someone to add them for you. What else can I do to help prevent falls? Wear shoes that: Do not have high heels. Have rubber bottoms. Are comfortable and fit you well. Are closed at the toe. Do not wear sandals. If you use a stepladder: Make sure that it is fully opened. Do not climb a closed stepladder. Make sure that both sides of the stepladder are locked into place. Ask someone to hold it for you, if possible. Clearly mark and make sure that you can see: Any grab bars or handrails. First and last steps. Where the edge of each step is. Use tools that help you move around (mobility aids) if they are needed. These include: Canes. Walkers. Scooters. Crutches. Turn on the lights when you go into a dark area. Replace any light bulbs as soon as they burn out. Set up your furniture so you have a clear path. Avoid moving your furniture around. If any of your floors are uneven, fix them. If there are any pets around you, be aware of where they are. Review your medicines with your doctor. Some medicines can make you feel dizzy. This can increase your chance of falling. Ask your doctor what other things that you can do to help prevent falls. This information is not intended to replace advice given to you by your health care provider. Make sure you discuss any questions you have with your health care provider. Document Released: 10/04/2009 Document Revised: 05/15/2016 Document Reviewed: 01/12/2015 Elsevier Interactive Patient Education  2017 [employer].

## 2023-04-21 ENCOUNTER — Other Ambulatory Visit: Payer: Self-pay | Admitting: Family Medicine

## 2023-04-21 DIAGNOSIS — M545 Low back pain, unspecified: Secondary | ICD-10-CM

## 2023-04-29 ENCOUNTER — Encounter: Payer: Self-pay | Admitting: Gastroenterology

## 2023-05-22 DIAGNOSIS — K080 Exfoliation of teeth due to systemic causes: Secondary | ICD-10-CM | POA: Diagnosis not present

## 2023-05-26 DIAGNOSIS — K080 Exfoliation of teeth due to systemic causes: Secondary | ICD-10-CM | POA: Diagnosis not present

## 2023-05-28 ENCOUNTER — Other Ambulatory Visit: Payer: Self-pay | Admitting: Family Medicine

## 2023-05-28 DIAGNOSIS — M545 Low back pain, unspecified: Secondary | ICD-10-CM

## 2023-05-28 NOTE — Telephone Encounter (Signed)
Requested medication (s) are due for refill today: yes  Requested medication (s) are on the active medication list: yes  Last refill:  04/22/23 #30  Future visit scheduled: yes  Notes to clinic:  overdue lab work    Requested Prescriptions  Pending Prescriptions Disp Refills   meloxicam (MOBIC) 7.5 MG tablet [Pharmacy Med Name: Meloxicam 7.5 MG Oral Tablet] 30 tablet 0    Sig: Take 1 tablet by mouth once daily     Analgesics:  COX2 Inhibitors Failed - 05/28/2023  9:05 AM      Failed - Manual Review: Labs are only required if the patient has taken medication for more than 8 weeks.      Failed - HGB in normal range and within 360 days    Hemoglobin  Date Value Ref Range Status  12/27/2021 14.0 11.1 - 15.9 g/dL Final         Failed - HCT in normal range and within 360 days    Hematocrit  Date Value Ref Range Status  12/27/2021 41.3 34.0 - 46.6 % Final         Passed - Cr in normal range and within 360 days    Creatinine, Ser  Date Value Ref Range Status  12/29/2022 0.74 0.57 - 1.00 mg/dL Final         Passed - AST in normal range and within 360 days    AST  Date Value Ref Range Status  06/02/2022 18 0 - 40 IU/L Final         Passed - ALT in normal range and within 360 days    ALT  Date Value Ref Range Status  06/02/2022 19 0 - 32 IU/L Final         Passed - eGFR is 30 or above and within 360 days    GFR calc Af Amer  Date Value Ref Range Status  04/03/2020 99 >59 mL/min/1.73 Final   GFR, Estimated  Date Value Ref Range Status  12/13/2020 >60 >60 mL/min Final    Comment:    (NOTE) Calculated using the CKD-EPI Creatinine Equation (2021)    eGFR  Date Value Ref Range Status  12/29/2022 90 >59 mL/min/1.73 Final         Passed - Patient is not pregnant      Passed - Valid encounter within last 12 months    Recent Outpatient Visits           2 months ago Side pain   [place] [facility]   5  months ago Essential hypertension   [place] [facility]   12 months ago Essential hypertension   [place] [facility]   1 year ago Erroneous encounter - disregard   [facility]   1 year ago Flank pain   [place] [facility]       Future Appointments             In 1 month Duanne Limerick, MD [facility] at [facility], [facility]

## 2023-06-12 ENCOUNTER — Telehealth: Payer: Self-pay | Admitting: Family Medicine

## 2023-06-12 ENCOUNTER — Other Ambulatory Visit: Payer: Self-pay

## 2023-06-12 NOTE — Telephone Encounter (Signed)
Copied from CRM [id number]. Topic: Referral - Request for Referral >> Jun 12, 2023 10:55 AM Macon Large wrote: Has patient seen PCP for this complaint? Yes.   *If NO, is insurance requiring patient see PCP for this issue before PCP can refer them? Referral for which specialty: Mammogram Preferred provider/office: no specific provider/location Reason for referral: breast exam

## 2023-06-15 ENCOUNTER — Ambulatory Visit (INDEPENDENT_AMBULATORY_CARE_PROVIDER_SITE_OTHER): Payer: Medicare Other | Admitting: Family Medicine

## 2023-06-15 ENCOUNTER — Encounter: Payer: Self-pay | Admitting: Family Medicine

## 2023-06-15 VITALS — BP 120/60 | HR 80 | Ht 64.0 in | Wt 148.0 lb

## 2023-06-15 DIAGNOSIS — N644 Mastodynia: Secondary | ICD-10-CM

## 2023-06-15 NOTE — Progress Notes (Signed)
Date:  06/15/2023   Name:  Cheyenne Bean   DOB:  Sep 17, 1956   MRN:  [id number]   Chief Complaint: breast exam  Patient is a 67 year old female who presents for a mastalgia/ exam. The patient reports the following problems: breast tenderness/left lateral. Health maintenance has been reviewed mammograms up to date.      Lab Results  Component Value Date   NA 142 12/29/2022   K 4.3 12/29/2022   CO2 24 12/29/2022   GLUCOSE 87 12/29/2022   BUN 14 12/29/2022   CREATININE 0.74 12/29/2022   CALCIUM 9.7 12/29/2022   EGFR 90 12/29/2022   GFRNONAA >60 12/13/2020   Lab Results  Component Value Date   CHOL 153 06/02/2022   HDL 54 06/02/2022   LDLCALC 89 06/02/2022   TRIG 45 06/02/2022   CHOLHDL 3.2 03/26/2018   No results found for: "TSH" No results found for: "HGBA1C" Lab Results  Component Value Date   WBC 5.2 12/27/2021   HGB 14.0 12/27/2021   HCT 41.3 12/27/2021   MCV 87 12/27/2021   PLT 236 12/27/2021   Lab Results  Component Value Date   ALT 19 06/02/2022   AST 18 06/02/2022   ALKPHOS 126 (H) 06/02/2022   BILITOT 0.3 12/27/2021   No results found for: "25OHVITD2", "25OHVITD3", "VD25OH"   Review of Systems  Constitutional:  Negative for diaphoresis.  HENT:  Negative for trouble swallowing.   Respiratory:  Negative for chest tightness and shortness of breath.   Cardiovascular:  Negative for chest pain, palpitations and leg swelling.  Gastrointestinal:  Negative for abdominal pain and blood in stool.    Patient Active Problem List   Diagnosis Date Noted   Pure hypercholesterolemia 11/04/2016   Essential hypertension 09/18/2015   Hyperlipidemia 09/18/2015   Recurrent genital herpes simplex 09/18/2015    No Known Allergies  Past Surgical History:  Procedure Laterality Date   BACK SURGERY     BUNIONECTOMY Left    COLONOSCOPY  2012   cleared for 10 yrs   COLONOSCOPY WITH PROPOFOL N/A 02/27/2017   Procedure: COLONOSCOPY WITH PROPOFOL;  Surgeon:  Christena Deem, MD;  Location: [facility]COPY;  Service: Endoscopy;  Laterality: N/A;   COLONOSCOPY WITH PROPOFOL N/A 03/30/2023   Procedure: COLONOSCOPY WITH PROPOFOL;  Surgeon: Regis Bill, MD;  Location: [facility] ENDOSCOPY;  Service: Endoscopy;  Laterality: N/A;   FOOT SURGERY Left    VAGINAL HYSTERECTOMY      Social History   Tobacco Use   Smoking status: Never   Smokeless tobacco: Never  Vaping Use   Vaping Use: Never used  Substance Use Topics   Alcohol use: No    Alcohol/week: 0.0 standard drinks of alcohol   Drug use: No     Medication list has been reviewed and updated.  Current Meds  Medication Sig   amLODipine (NORVASC) 5 MG tablet TAKE 1 & 1/2 (ONE & ONE-HALF) TABLETS BY MOUTH ONCE DAILY = 7.5MG    Ascorbic Acid (VITA-C PO) Take by mouth.   BIOTIN PO Take by mouth.   Cyanocobalamin (VITAMIN B-12 PO) Take by mouth.   cyclobenzaprine (FLEXERIL) 5 MG tablet Take 1 tablet (5 mg total) by mouth 3 (three) times daily as needed for muscle spasms.   ibuprofen (ADVIL) 600 MG tablet Take 1 tablet (600 mg total) by mouth every 6 (six) hours as needed.   meclizine (ANTIVERT) 25 MG tablet Take 1 tablet (25 mg total) by mouth as needed for  dizziness.   meloxicam (MOBIC) 7.5 MG tablet Take 1 tablet by mouth once daily   Multiple Vitamin (MULTIVITAMIN) tablet Take 1 tablet by mouth daily.   pravastatin (PRAVACHOL) 20 MG tablet Take 1 tablet (20 mg total) by mouth daily.   valACYclovir (VALTREX) 500 MG tablet Take 1 tablet (500 mg total) by mouth daily.   VITAMIN D PO Take by mouth.   VITAMIN E PO Take by mouth.       06/15/2023   10:49 AM 03/26/2023   10:26 AM 12/29/2022    8:21 AM 06/02/2022    8:16 AM  GAD 7 : Generalized Anxiety Score  Nervous, Anxious, on Edge 0 0 0 0  Control/stop worrying 0 0 0 0  Worry too much - different things 0 0 0 0  Trouble relaxing 0 0 0 0  Restless 0 0 0 0  Easily annoyed or irritable 0 1 0 0  Afraid - awful might happen 0 0 0 0  Total  GAD 7 Score 0 1 0 0  Anxiety Difficulty Not difficult at all Not difficult at all Not difficult at all Not difficult at all       06/15/2023   10:49 AM 04/09/2023    8:17 AM 03/26/2023   10:25 AM  Depression screen PHQ 2/9  Decreased Interest 0 0 0  Down, Depressed, Hopeless 0 0 0  PHQ - 2 Score 0 0 0  Altered sleeping 0 0 0  Tired, decreased energy 0 0 1  Change in appetite 0 0 0  Feeling bad or failure about yourself  0 0 0  Trouble concentrating 0 0 0  Moving slowly or fidgety/restless 0 0 0  Suicidal thoughts 0 0 0  PHQ-9 Score 0 0 1  Difficult doing work/chores Not difficult at all Not difficult at all Not difficult at all    BP Readings from Last 3 Encounters:  06/15/23 120/60  03/30/23 117/74  03/26/23 126/74    Physical Exam Vitals and nursing note reviewed. Exam conducted with a chaperone present.  HENT:     Head: Normocephalic.  Cardiovascular:     Heart sounds: Normal heart sounds. No murmur heard.    No friction rub. No gallop.  Pulmonary:     Breath sounds: No wheezing or rales.  Chest:     Chest wall: No mass.  Breasts:    Right: Normal. No swelling, bleeding, inverted nipple, mass, nipple discharge, skin change or tenderness.     Left: Tenderness present. No swelling, bleeding, inverted nipple, mass, nipple discharge or skin change.       Comments: Mild tenderness/no palpable mass or fullness/firmness Lymphadenopathy:     Upper Body:     Right upper body: No supraclavicular or axillary adenopathy.     Left upper body: No supraclavicular or axillary adenopathy.  Neurological:     Mental Status: She is alert.     Wt Readings from Last 3 Encounters:  06/15/23 148 lb (67.1 kg)  04/09/23 149 lb (67.6 kg)  03/30/23 145 lb (65.8 kg)    BP 120/60   Pulse 80   Ht 5\' 4"  (1.626 m)   Wt 148 lb (67.1 kg)   SpO2 98%   BMI 25.40 kg/m   Assessment and Plan:  1. Breast tenderness in female New onset.  Recurrent.  Stable.  Area in the lateral aspect  of the left breast is consistent with a cluster of cysts that were seen and previous ultrasound.  There  is no palpable mass or firmness in this area today there is some discomfort on palpation without axillary adenopathy.  I do not detect a change in the consistency nor a mass and we will continue to watch and if symptomatology should continue patient is to recontact [place] and we will either recheck or consider further evaluation.  Otherwise we will await for her next mammogram which is scheduled for October and have encouraged patient to recontact her GYN to reestablish as that she is going to need GYN services in the future.   Elizabeth Sauer, MD

## 2023-06-24 ENCOUNTER — Other Ambulatory Visit: Payer: Self-pay | Admitting: Family Medicine

## 2023-06-24 DIAGNOSIS — E7800 Pure hypercholesterolemia, unspecified: Secondary | ICD-10-CM

## 2023-06-24 NOTE — Telephone Encounter (Signed)
Requested medication (s) are due for refill today: yes  Requested medication (s) are on the active medication list: yes  Last refill:  12/29/22 #90/1  Future visit scheduled: yes  Notes to clinic:  Unable to refill per protocol due to failed labs, no updated results.      Requested Prescriptions  Pending Prescriptions Disp Refills   pravastatin (PRAVACHOL) 20 MG tablet [Pharmacy Med Name: Pravastatin Sodium 20 MG Oral Tablet] 90 tablet 0    Sig: Take 1 tablet by mouth once daily     Cardiovascular:  Antilipid - Statins Failed - 06/24/2023  9:47 AM      Failed - Lipid Panel in normal range within the last 12 months    Cholesterol, Total  Date Value Ref Range Status  06/02/2022 153 100 - 199 mg/dL Final   LDL Chol Calc (NIH)  Date Value Ref Range Status  06/02/2022 89 0 - 99 mg/dL Final   HDL  Date Value Ref Range Status  06/02/2022 54 >39 mg/dL Final   Triglycerides  Date Value Ref Range Status  06/02/2022 45 0 - 149 mg/dL Final         Passed - Patient is not pregnant      Passed - Valid encounter within last 12 months    Recent Outpatient Visits           1 week ago Breast tenderness in female   [facility]   3 months ago Side pain   [place] [facility]   5 months ago Essential hypertension   [place] [facility]   1 year ago Essential hypertension   [place] [facility]   1 year ago Erroneous encounter - disregard   [facility]       Future Appointments             In 5 days Duanne Limerick, MD [facility] at [facility], [facility]

## 2023-06-29 ENCOUNTER — Ambulatory Visit: Payer: Medicare Other | Admitting: Family Medicine

## 2023-06-29 DIAGNOSIS — I10 Essential (primary) hypertension: Secondary | ICD-10-CM

## 2023-06-29 DIAGNOSIS — A6000 Herpesviral infection of urogenital system, unspecified: Secondary | ICD-10-CM

## 2023-06-29 DIAGNOSIS — E7800 Pure hypercholesterolemia, unspecified: Secondary | ICD-10-CM

## 2023-07-02 ENCOUNTER — Encounter: Payer: Self-pay | Admitting: Family Medicine

## 2023-07-02 ENCOUNTER — Ambulatory Visit (INDEPENDENT_AMBULATORY_CARE_PROVIDER_SITE_OTHER): Payer: Medicare Other | Admitting: Family Medicine

## 2023-07-02 VITALS — BP 124/76 | HR 64 | Ht 64.0 in | Wt 145.0 lb

## 2023-07-02 DIAGNOSIS — A6000 Herpesviral infection of urogenital system, unspecified: Secondary | ICD-10-CM | POA: Diagnosis not present

## 2023-07-02 DIAGNOSIS — I10 Essential (primary) hypertension: Secondary | ICD-10-CM

## 2023-07-02 DIAGNOSIS — E7800 Pure hypercholesterolemia, unspecified: Secondary | ICD-10-CM

## 2023-07-02 MED ORDER — AMLODIPINE BESYLATE 5 MG PO TABS: ORAL_TABLET | ORAL | 1 refills | Status: DC

## 2023-07-02 MED ORDER — PRAVASTATIN SODIUM 20 MG PO TABS: 20.0000 mg | ORAL_TABLET | Freq: Every day | ORAL | 1 refills | Status: DC

## 2023-07-02 MED ORDER — VALACYCLOVIR HCL 500 MG PO TABS: 500.0000 mg | ORAL_TABLET | Freq: Every day | ORAL | 1 refills | Status: DC

## 2023-07-02 NOTE — Patient Instructions (Signed)
Managing Stress, Adult Feeling a certain amount of stress is normal. Stress helps our body and mind get ready to deal with the demands of life. Stress hormones can motivate you to do well at work and meet your responsibilities. But severe or long-term (chronic) stress can affect your mental and physical health. Chronic stress puts you at higher risk for: Anxiety and depression. Other health problems such as digestive problems, muscle aches, heart disease, high blood pressure, and stroke. What are the causes? Common causes of stress include: Demands from work, such as deadlines, feeling overworked, or having long hours. Pressures at home, such as money issues, disagreements with a spouse, or parenting issues. Pressures from major life changes, such as divorce, moving, loss of a loved one, or chronic illness. You may be at higher risk for stress-related problems if you: Do not get enough sleep. Are in poor health. Do not have emotional support. Have a mental health disorder such as anxiety or depression. How to recognize stress Stress can make you: Have trouble sleeping. Feel sad, anxious, irritable, or overwhelmed. Lose your appetite. Overeat or want to eat unhealthy foods. Want to use drugs or alcohol. Stress can also cause physical symptoms, such as: Sore, tense muscles, especially in the shoulders and neck. Headaches. Trouble breathing. A faster heart rate. Stomach pain, nausea, or vomiting. Diarrhea or constipation. Trouble concentrating. Follow these instructions at home: Eating and drinking Eat a healthy diet. This includes: Eating foods that are high in fiber, such as beans, whole grains, and fresh fruits and vegetables. Limiting foods that are high in fat and processed sugars, such as fried or sweet foods. Do not skip meals or overeat. Drink enough fluid to keep your urine pale yellow. Alcohol use Do not drink alcohol if: Your health care provider tells you not to  drink. You are pregnant, may be pregnant, or are planning to become pregnant. Drinking alcohol is a way some people try to ease their stress. This can be dangerous, so if you drink alcohol: Limit how much you have to: 0-1 drink a day for women. 0-2 drinks a day for men. Know how much alcohol is in your drink. In the [place] one drink equals one 12 oz bottle of beer (355 mL), one 5 oz glass of wine (148 mL), or one 1 oz glass of hard liquor (44 mL). Activity  Include 30 minutes of exercise in your daily schedule. Exercise is a good stress reducer. Include time in your day for an activity that you find relaxing. Try taking a walk, going on a bike ride, reading a book, or listening to music. Schedule your time in a way that lowers stress, and keep a regular schedule. Focus on doing what is most important to get done. Lifestyle Identify the source of your stress and your reaction to it. See a therapist who can help you change unhelpful reactions. When there are stressful events: Talk about them with family, friends, or coworkers. Try to think realistically about stressful events and not ignore them or overreact. Try to find the positives in a stressful situation and not focus on the negatives. Cut back on responsibilities at work and home, if possible. Ask for help from friends or family members if you need it. Find ways to manage stress, such as: Mindfulness, meditation, or deep breathing. Yoga or tai chi. Progressive muscle relaxation. Spending time in nature. Doing art, playing music, or reading. Making time for fun activities. Spending time with family and friends. Get support   from family, friends, or spiritual resources. General instructions Get enough sleep. Try to go to sleep and get up at about the same time every day. Take over-the-counter and prescription medicines only as told by your health care provider. Do not use any products that contain nicotine or tobacco. These products  include cigarettes, chewing tobacco, and vaping devices, such as e-cigarettes. If you need help quitting, ask your health care provider. Do not use drugs or smoke to deal with stress. Keep all follow-up visits. This is important. Where to find support Talk with your health care provider about stress management or finding a support group. Find a therapist to work with you on your stress management techniques. Where to find more information National Alliance on Mental Illness: www.nami.org American Psychological Association: www.apa.org Contact a health care provider if: Your stress symptoms get worse. You are unable to manage your stress at home. You are struggling to stop using drugs or alcohol. Get help right away if: You may be a danger to yourself or others. You have any thoughts of death or suicide. Get help right awayif you feel like you may hurt yourself or others, or have thoughts about taking your own life. Go to your nearest emergency room or: Call 911. Call the National Suicide Prevention Lifeline at 1-800-273-8255 or 988 in the [place] This is open 24 hours a day. Text the Crisis Text Line at 741741. Summary Feeling a certain amount of stress is normal, but severe or long-term (chronic) stress can affect your mental and physical health. Chronic stress can put you at higher risk for anxiety, depression, and other health problems such as digestive problems, muscle aches, heart disease, high blood pressure, and stroke. You may be at higher risk for stress-related problems if you do not get enough sleep, are in poor health, lack emotional support, or have a mental health disorder such as anxiety or depression. Identify the source of your stress and your reaction to it. Try talking about stressful events with family, friends, or coworkers, finding a coping method, or getting support from spiritual resources. If you need more help, talk with your health care provider about finding a  support group or a mental health therapist. This information is not intended to replace advice given to you by your health care provider. Make sure you discuss any questions you have with your health care provider. Document Revised: 07/04/2021 Document Reviewed: 07/02/2021 Elsevier Patient Education  2024 [employer]  

## 2023-07-02 NOTE — Progress Notes (Signed)
Date:  07/02/2023   Name:  Cheyenne Bean   DOB:  1956-02-27   MRN:  [id number]   Chief Complaint: Hypertension, Hyperlipidemia, and Herpes Zoster  Hypertension This is a chronic problem. The current episode started more than 1 year ago. The problem has been gradually improving since onset. The problem is controlled. Pertinent negatives include no blurred vision, chest pain, headaches, orthopnea, palpitations, PND or shortness of breath. There are no associated agents to hypertension. Risk factors for coronary artery disease include dyslipidemia. Past treatments include calcium channel blockers. The current treatment provides moderate improvement. There are no compliance problems.  There is no history of angina, CAD/MI or CVA. There is no history of chronic renal disease, a hypertension causing med or renovascular disease.  Hyperlipidemia This is a chronic problem. The current episode started more than 1 year ago. The problem is controlled. Recent lipid tests were reviewed and are normal. She has no history of chronic renal disease or diabetes. Pertinent negatives include no chest pain or shortness of breath. Current antihyperlipidemic treatment includes statins. The current treatment provides moderate improvement of lipids. There are no compliance problems.  Risk factors for coronary artery disease include dyslipidemia and hypertension.    Lab Results  Component Value Date   NA 142 12/29/2022   K 4.3 12/29/2022   CO2 24 12/29/2022   GLUCOSE 87 12/29/2022   BUN 14 12/29/2022   CREATININE 0.74 12/29/2022   CALCIUM 9.7 12/29/2022   EGFR 90 12/29/2022   GFRNONAA >60 12/13/2020   Lab Results  Component Value Date   CHOL 153 06/02/2022   HDL 54 06/02/2022   LDLCALC 89 06/02/2022   TRIG 45 06/02/2022   CHOLHDL 3.2 03/26/2018   No results found for: "TSH" No results found for: "HGBA1C" Lab Results  Component Value Date   WBC 5.2 12/27/2021   HGB 14.0 12/27/2021   HCT 41.3  12/27/2021   MCV 87 12/27/2021   PLT 236 12/27/2021   Lab Results  Component Value Date   ALT 19 06/02/2022   AST 18 06/02/2022   ALKPHOS 126 (H) 06/02/2022   BILITOT 0.3 12/27/2021   No results found for: "25OHVITD2", "25OHVITD3", "VD25OH"   Review of Systems  Constitutional:  Negative for diaphoresis, fever and unexpected weight change.  HENT:  Negative for nosebleeds, postnasal drip and rhinorrhea.   Eyes:  Negative for blurred vision.  Respiratory:  Negative for cough, chest tightness, shortness of breath and wheezing.   Cardiovascular:  Negative for chest pain, palpitations, orthopnea and PND.  Gastrointestinal:  Negative for abdominal pain, constipation and nausea.  Neurological:  Negative for headaches.    Patient Active Problem List   Diagnosis Date Noted   Pure hypercholesterolemia 11/04/2016   Essential hypertension 09/18/2015   Hyperlipidemia 09/18/2015   Recurrent genital herpes simplex 09/18/2015    No Known Allergies  Past Surgical History:  Procedure Laterality Date   BACK SURGERY     BUNIONECTOMY Left    COLONOSCOPY  2012   cleared for 10 yrs   COLONOSCOPY WITH PROPOFOL N/A 02/27/2017   Procedure: COLONOSCOPY WITH PROPOFOL;  Surgeon: Christena Deem, MD;  Location: [facility] ENDOSCOPY;  Service: Endoscopy;  Laterality: N/A;   COLONOSCOPY WITH PROPOFOL N/A 03/30/2023   Procedure: COLONOSCOPY WITH PROPOFOL;  Surgeon: Regis Bill, MD;  Location: [facility] ENDOSCOPY;  Service: Endoscopy;  Laterality: N/A;   FOOT SURGERY Left    VAGINAL HYSTERECTOMY      Social History   Tobacco  Use   Smoking status: Never   Smokeless tobacco: Never  Vaping Use   Vaping status: Never Used  Substance Use Topics   Alcohol use: No    Alcohol/week: 0.0 standard drinks of alcohol   Drug use: No     Medication list has been reviewed and updated.  Current Meds  Medication Sig   amLODipine (NORVASC) 5 MG tablet TAKE 1 & 1/2 (ONE & ONE-HALF) TABLETS BY MOUTH ONCE DAILY  = 7.5MG    Ascorbic Acid (VITA-C PO) Take by mouth.   BIOTIN PO Take by mouth.   Cyanocobalamin (VITAMIN B-12 PO) Take by mouth.   cyclobenzaprine (FLEXERIL) 5 MG tablet Take 1 tablet (5 mg total) by mouth 3 (three) times daily as needed for muscle spasms.   ibuprofen (ADVIL) 600 MG tablet Take 1 tablet (600 mg total) by mouth every 6 (six) hours as needed.   meclizine (ANTIVERT) 25 MG tablet Take 1 tablet (25 mg total) by mouth as needed for dizziness.   meloxicam (MOBIC) 7.5 MG tablet Take 1 tablet by mouth once daily   Multiple Vitamin (MULTIVITAMIN) tablet Take 1 tablet by mouth daily.   pravastatin (PRAVACHOL) 20 MG tablet Take 1 tablet (20 mg total) by mouth daily.   valACYclovir (VALTREX) 500 MG tablet Take 1 tablet (500 mg total) by mouth daily.   VITAMIN D PO Take by mouth.   VITAMIN E PO Take by mouth.       07/02/2023   10:26 AM 06/15/2023   10:49 AM 03/26/2023   10:26 AM 12/29/2022    8:21 AM  GAD 7 : Generalized Anxiety Score  Nervous, Anxious, on Edge 0 0 0 0  Control/stop worrying 0 0 0 0  Worry too much - different things 0 0 0 0  Trouble relaxing 0 0 0 0  Restless 0 0 0 0  Easily annoyed or irritable 0 0 1 0  Afraid - awful might happen 0 0 0 0  Total GAD 7 Score 0 0 1 0  Anxiety Difficulty Not difficult at all Not difficult at all Not difficult at all Not difficult at all       07/02/2023   10:25 AM 06/15/2023   10:49 AM 04/09/2023    8:17 AM  Depression screen PHQ 2/9  Decreased Interest 0 0 0  Down, Depressed, Hopeless 0 0 0  PHQ - 2 Score 0 0 0  Altered sleeping 0 0 0  Tired, decreased energy 0 0 0  Change in appetite 0 0 0  Feeling bad or failure about yourself  0 0 0  Trouble concentrating 0 0 0  Moving slowly or fidgety/restless 0 0 0  Suicidal thoughts 0 0 0  PHQ-9 Score 0 0 0  Difficult doing work/chores Not difficult at all Not difficult at all Not difficult at all    BP Readings from Last 3 Encounters:  07/02/23 124/76  06/15/23 120/60   03/30/23 117/74    Physical Exam Vitals and nursing note reviewed. Exam conducted with a chaperone present.  Constitutional:      General: She is not in acute distress.    Appearance: She is not diaphoretic.  HENT:     Head: Normocephalic and atraumatic.     Right Ear: External ear normal.     Left Ear: External ear normal.     Nose: Nose normal.  Eyes:     General:        Right eye: No discharge.  Left eye: No discharge.     Conjunctiva/sclera: Conjunctivae normal.     Pupils: Pupils are equal, round, and reactive to light.  Neck:     Thyroid: No thyromegaly.     Vascular: No JVD.  Cardiovascular:     Rate and Rhythm: Normal rate and regular rhythm.     Heart sounds: Normal heart sounds. No murmur heard.    No friction rub. No gallop.  Pulmonary:     Effort: Pulmonary effort is normal.     Breath sounds: Normal breath sounds. No wheezing, rhonchi or rales.  Abdominal:     General: Bowel sounds are normal.     Palpations: Abdomen is soft. There is no mass.     Tenderness: There is no abdominal tenderness. There is no guarding.  Musculoskeletal:        General: Normal range of motion.     Cervical back: Normal range of motion and neck supple.  Lymphadenopathy:     Cervical: No cervical adenopathy.  Skin:    General: Skin is warm and dry.  Neurological:     General: No focal deficit present.     Mental Status: She is alert.     Motor: No weakness.     Coordination: Coordination normal.     Deep Tendon Reflexes: Reflexes are normal and symmetric.     Wt Readings from Last 3 Encounters:  07/02/23 145 lb (65.8 kg)  06/15/23 148 lb (67.1 kg)  04/09/23 149 lb (67.6 kg)    BP 124/76   Pulse 64   Ht 5\' 4"  (1.626 m)   Wt 145 lb (65.8 kg)   SpO2 97%   BMI 24.89 kg/m   Assessment and Plan: 1. Essential hypertension Chronic.  Controlled.  Stable.  Blood pressure 124/76.  Asymptomatic.  Tolerating medication well.  Continue amlodipine 5 mg 1 and half tablets  which is equivalent to 7.5 mg once a day.  Will check CMP for electrolytes and GFR.  Will recheck patient in 6 months. - amLODipine (NORVASC) 5 MG tablet; TAKE 1 & 1/2 (ONE & ONE-HALF) TABLETS BY MOUTH ONCE DAILY = 7.5MG   Dispense: 135 tablet; Refill: 1 - Comprehensive Metabolic Panel (CMET)  2. Pure hypercholesterolemia Chronic.  Controlled.  Stable.  Tolerating medication well.  Without myalgias.  Continue pravastatin 20 mg once a day.  Will check lipid panel for current LDL management. - pravastatin (PRAVACHOL) 20 MG tablet; Take 1 tablet (20 mg total) by mouth daily.  Dispense: 90 tablet; Refill: 1 - Lipid Panel With LDL/HDL Ratio  3. Recurrent genital herpes simplex .  Controlled.  Stable.  Continue valacyclovir 500 mg 1 daily. - valACYclovir (VALTREX) 500 MG tablet; Take 1 tablet (500 mg total) by mouth daily.  Dispense: 90 tablet; Refill: 1     Elizabeth Sauer, MD

## 2023-07-03 LAB — COMPREHENSIVE METABOLIC PANEL
ALT: 22 IU/L (ref 0–32)
AST: 20 IU/L (ref 0–40)
Albumin: 4.6 g/dL (ref 3.9–4.9)
Alkaline Phosphatase: 124 IU/L — ABNORMAL HIGH (ref 44–121)
BUN/Creatinine Ratio: 22 (ref 12–28)
BUN: 16 mg/dL (ref 8–27)
Bilirubin Total: 0.4 mg/dL (ref 0.0–1.2)
CO2: 23 mmol/L (ref 20–29)
Calcium: 9.7 mg/dL (ref 8.7–10.3)
Chloride: 106 mmol/L (ref 96–106)
Creatinine, Ser: 0.73 mg/dL (ref 0.57–1.00)
Globulin, Total: 3.1 g/dL (ref 1.5–4.5)
Glucose: 95 mg/dL (ref 70–99)
Potassium: 4.6 mmol/L (ref 3.5–5.2)
Sodium: 143 mmol/L (ref 134–144)
Total Protein: 7.7 g/dL (ref 6.0–8.5)
eGFR: 92 mL/min/{1.73_m2} (ref >59)

## 2023-07-03 LAB — LIPID PANEL WITH LDL/HDL RATIO
Cholesterol, Total: 164 mg/dL (ref 100–199)
HDL: 54 mg/dL (ref >39)
LDL Chol Calc (NIH): 100 mg/dL — ABNORMAL HIGH (ref 0–99)
LDL/HDL Ratio: 1.9 ratio (ref 0.0–3.2)
Triglycerides: 49 mg/dL (ref 0–149)
VLDL Cholesterol Cal: 10 mg/dL (ref 5–40)

## 2023-07-20 DIAGNOSIS — H2513 Age-related nuclear cataract, bilateral: Secondary | ICD-10-CM | POA: Diagnosis not present

## 2023-07-20 DIAGNOSIS — D869 Sarcoidosis, unspecified: Secondary | ICD-10-CM | POA: Diagnosis not present

## 2023-07-20 DIAGNOSIS — H25012 Cortical age-related cataract, left eye: Secondary | ICD-10-CM | POA: Diagnosis not present

## 2023-07-20 DIAGNOSIS — H25011 Cortical age-related cataract, right eye: Secondary | ICD-10-CM | POA: Diagnosis not present

## 2023-07-22 ENCOUNTER — Other Ambulatory Visit (HOSPITAL_COMMUNITY)
Admission: RE | Admit: 2023-07-22 | Discharge: 2023-07-22 | Disposition: A | Discharge status: Home / Self Care | Payer: Medicare Other | Source: Ambulatory Visit | Attending: Obstetrics and Gynecology | Admitting: Obstetrics and Gynecology

## 2023-07-22 ENCOUNTER — Other Ambulatory Visit: Payer: Self-pay | Admitting: Obstetrics and Gynecology

## 2023-07-22 ENCOUNTER — Encounter: Payer: Self-pay | Admitting: Obstetrics and Gynecology

## 2023-07-22 ENCOUNTER — Ambulatory Visit (INDEPENDENT_AMBULATORY_CARE_PROVIDER_SITE_OTHER): Payer: Medicare Other | Admitting: Obstetrics and Gynecology

## 2023-07-22 VITALS — BP 133/78 | HR 72 | Resp 16 | Ht 64.0 in | Wt 148.0 lb

## 2023-07-22 DIAGNOSIS — Z1151 Encounter for screening for human papillomavirus (HPV): Secondary | ICD-10-CM | POA: Insufficient documentation

## 2023-07-22 DIAGNOSIS — N644 Mastodynia: Secondary | ICD-10-CM

## 2023-07-22 DIAGNOSIS — Z872 Personal history of diseases of the skin and subcutaneous tissue: Secondary | ICD-10-CM

## 2023-07-22 DIAGNOSIS — Z803 Family history of malignant neoplasm of breast: Secondary | ICD-10-CM

## 2023-07-22 DIAGNOSIS — Z01419 Encounter for gynecological examination (general) (routine) without abnormal findings: Secondary | ICD-10-CM | POA: Insufficient documentation

## 2023-07-22 DIAGNOSIS — Z124 Encounter for screening for malignant neoplasm of cervix: Secondary | ICD-10-CM

## 2023-07-22 DIAGNOSIS — N888 Other specified noninflammatory disorders of cervix uteri: Secondary | ICD-10-CM

## 2023-07-22 DIAGNOSIS — Z1231 Encounter for screening mammogram for malignant neoplasm of breast: Secondary | ICD-10-CM

## 2023-07-22 NOTE — Progress Notes (Signed)
ANNUAL PREVENTATIVE CARE GYNECOLOGY  ENCOUNTER NOTE  Subjective:       Cheyenne Bean is a 67 y.o. G87P0010 female here for a routine annual gynecologic exam. The patient is sexually active. The patient is not taking hormone replacement therapy. Patient denies post-menopausal vaginal bleeding. The patient wears seatbelts: yes. The patient participates in regular exercise: no. Has the patient ever been transfused or tattooed?: no. The patient reports that there is not domestic violence in her life.  Current complaints: 1.  Has been having left breast pain x 3 weeks. Denies nipple discharge or any distinct masses. Does note a history of dense breast tissue noted on mammograms and has had a breast cyst in the past.  Breast is concerned due to her family history of breast cancer in her sister.  Is any significant caffeine intake recent breast trauma.   Gynecologic History No LMP recorded. Patient has had a hysterectomy. Contraception: status post hysterectomy Last Pap: unsure, thought she had one last year but not on file.  Last mammogram: 09/29/2022. Results were: normal Last Colonoscopy: 03/30/2023: Recommend repeat in 7 years Last Dexa Scan: Never had one   Obstetric History OB History  Gravida Para Term Preterm AB Living  1 0     1 0  SAB IAB Ectopic Multiple Live Births      1        # Outcome Date GA Lbr Len/2nd Weight Sex Type Anes PTL Lv  1 Ectopic             Past Medical History:  Diagnosis Date   BRCA negative 01/2017   MyRisk neg   Family history of breast cancer    1/18 IBIS=10.7%   Family history of ovarian cancer    MyRisk neg   Genital herpes    Hyperlipidemia    Hypertension    IBS (irritable bowel syndrome) 02/20/2017   Sarcoidosis     Family History  Problem Relation Age of Onset   Hypertension Mother    Breast cancer Sister 28   Cervical cancer Sister 51   Ovarian cancer Maternal Aunt 60   Colon cancer Maternal Uncle 50    Past Surgical  History:  Procedure Laterality Date   BACK SURGERY     BUNIONECTOMY Left    COLONOSCOPY  2012   cleared for 10 yrs   COLONOSCOPY WITH PROPOFOL N/A 02/27/2017   Procedure: COLONOSCOPY WITH PROPOFOL;  Surgeon: Christena Deem, MD;  Location: [facility] ENDOSCOPY;  Service: Endoscopy;  Laterality: N/A;   COLONOSCOPY WITH PROPOFOL N/A 03/30/2023   Procedure: COLONOSCOPY WITH PROPOFOL;  Surgeon: Regis Bill, MD;  Location: [facility] ENDOSCOPY;  Service: Endoscopy;  Laterality: N/A;   FOOT SURGERY Left    VAGINAL HYSTERECTOMY      Social History   Socioeconomic History   Marital status: Married    Spouse name: Not on file   Number of children: Not on file   Years of education: Not on file   Highest education level: Not on file  Occupational History   Not on file  Tobacco Use   Smoking status: Never   Smokeless tobacco: Never  Vaping Use   Vaping status: Never Used  Substance and Sexual Activity   Alcohol use: No    Alcohol/week: 0.0 standard drinks of alcohol   Drug use: No   Sexual activity: Yes  Other Topics Concern   Not on file  Social History Narrative   Not on file  Social Determinants of Health   Financial Resource Strain: Low Risk  (04/09/2023)   Overall Financial Resource Strain (CARDIA)    Difficulty of Paying Living Expenses: Not hard at all  Food Insecurity: No Food Insecurity (04/09/2023)   Hunger Vital Sign    Worried About Running Out of Food in the Last Year: Never true    Ran Out of Food in the Last Year: Never true  Transportation Needs: No Transportation Needs (04/09/2023)   PRAPARE - Administrator, Civil Service (Medical): No    Lack of Transportation (Non-Medical): No  Physical Activity: Insufficiently Active (04/09/2023)   Exercise Vital Sign    Days of Exercise per Week: 3 days    Minutes of Exercise per Session: 30 min  Stress: No Stress Concern Present (04/09/2023)   [employer] of Occupational Health - Occupational Stress  Questionnaire    Feeling of Stress : Only a little  Social Connections: Socially Isolated (04/09/2023)   Social Connection and Isolation Panel [NHANES]    Frequency of Communication with Friends and Family: More than three times a week    Frequency of Social Gatherings with Friends and Family: Once a week    Attends Religious Services: Never    Database administrator or Organizations: No    Attends Banker Meetings: Never    Marital Status: Separated  Intimate Partner Violence: Not At Risk (04/09/2023)   Humiliation, Afraid, Rape, and Kick questionnaire    Fear of Current or Ex-Partner: No    Emotionally Abused: No    Physically Abused: No    Sexually Abused: No    Current Outpatient Medications on File Prior to Visit  Medication Sig Dispense Refill   amLODipine (NORVASC) 5 MG tablet TAKE 1 & 1/2 (ONE & ONE-HALF) TABLETS BY MOUTH ONCE DAILY = 7.5MG  135 tablet 1   Ascorbic Acid (VITA-C PO) Take by mouth.     BIOTIN PO Take by mouth.     Cyanocobalamin (VITAMIN B-12 PO) Take by mouth.     cyclobenzaprine (FLEXERIL) 5 MG tablet Take 1 tablet (5 mg total) by mouth 3 (three) times daily as needed for muscle spasms. 30 tablet 1   ibuprofen (ADVIL) 600 MG tablet Take 1 tablet (600 mg total) by mouth every 6 (six) hours as needed. 30 tablet 0   meclizine (ANTIVERT) 25 MG tablet Take 1 tablet (25 mg total) by mouth as needed for dizziness. 30 tablet 0   meloxicam (MOBIC) 7.5 MG tablet Take 1 tablet by mouth once daily 30 tablet 0   Multiple Vitamin (MULTIVITAMIN) tablet Take 1 tablet by mouth daily.     pravastatin (PRAVACHOL) 20 MG tablet Take 1 tablet (20 mg total) by mouth daily. 90 tablet 1   valACYclovir (VALTREX) 500 MG tablet Take 1 tablet (500 mg total) by mouth daily. 90 tablet 1   VITAMIN D PO Take by mouth.     VITAMIN E PO Take by mouth.     No current facility-administered medications on file prior to visit.    No Known Allergies    Review of  Systems ROS Review of Systems - General ROS: negative for - chills, fatigue, fever, hot flashes, night sweats, weight gain or weight loss Psychological ROS: negative for - anxiety, decreased libido, depression, mood swings, physical abuse or sexual abuse Ophthalmic ROS: negative for - blurry vision, eye pain or loss of vision ENT ROS: negative for - headaches, hearing change, visual changes or vocal changes  Allergy and Immunology ROS: negative for - hives, itchy/watery eyes or seasonal allergies Hematological and Lymphatic ROS: negative for - bleeding problems, bruising, swollen lymph nodes or weight loss Endocrine ROS: negative for - galactorrhea, hair pattern changes, hot flashes, malaise/lethargy, mood swings, palpitations, polydipsia/polyuria, skin changes, temperature intolerance or unexpected weight changes Breast ROS: negative for - new or changing breast lumps or nipple discharge. Positive for left breast pain x 3 weeks (see HPI).  Respiratory ROS: negative for - cough or shortness of breath Cardiovascular ROS: negative for - chest pain, irregular heartbeat, palpitations or shortness of breath Gastrointestinal ROS: no abdominal pain, change in bowel habits, or black or bloody stools Genito-Urinary ROS: no dysuria, trouble voiding, or hematuria Musculoskeletal ROS: negative for - joint pain or joint stiffness Neurological ROS: negative for - bowel and bladder control changes Dermatological ROS: negative for rash and skin lesion changes   Objective:   BP 133/78   Pulse 72   Resp 16   Ht 5\' 4"  (1.626 m)   Wt 148 lb (67.1 kg)   BMI 25.40 kg/m  CONSTITUTIONAL: Well-developed, well-nourished female in no acute distress.  PSYCHIATRIC: Normal mood and affect. Normal behavior. Normal judgment and thought content. NEUROLGIC: Alert and oriented to person, place, and time. Normal muscle tone coordination. No cranial nerve deficit noted. HENT:  Normocephalic, atraumatic, External right and  left ear normal. Oropharynx is clear and moist EYES: Conjunctivae and EOM are normal. Pupils are equal, round, and reactive to light. No scleral icterus.  NECK: Normal range of motion, supple, no masses.  Normal thyroid.  SKIN: Skin is warm and dry. No rash noted. Not diaphoretic. No erythema. No pallor. CARDIOVASCULAR: Normal heart rate noted, regular rhythm, no murmur. RESPIRATORY: Clear to auscultation bilaterally. Effort and breath sounds normal, no problems with respiration noted. BREASTS: Symmetric in size. No masses, skin changes, nipple drainage, or lymphadenopathy. ABDOMEN: Soft, normal bowel sounds, no distention noted.  No tenderness, rebound or guarding.  BLADDER: Normal PELVIC:  Bladder no bladder distension noted  Urethra: normal appearing urethra with no masses, tenderness or lesions  Vulva: normal appearing vulva with no masses, tenderness or lesions  Vagina: mildly atrophic vagina with no discharge, no lesions  Cervix: without discharge or tenderness, Nabothian cyst at 3 o'clock (?),  Obscuring cervical os.  Uterus: surgically absent however cervix remains.  Adnexa: normal adnexa in size, nontender and no masses  RV: External Exam NormaI, No Rectal Masses, and Normal Sphincter tone  MUSCULOSKELETAL: Normal range of motion. No tenderness.  No cyanosis, clubbing, or edema.  2+ distal pulses. LYMPHATIC: No Axillary, Supraclavicular, or Inguinal Adenopathy.   Labs: Lab Results  Component Value Date   WBC 5.2 12/27/2021   HGB 14.0 12/27/2021   HCT 41.3 12/27/2021   MCV 87 12/27/2021   PLT 236 12/27/2021    Lab Results  Component Value Date   CREATININE 0.73 07/02/2023   BUN 16 07/02/2023   NA 143 07/02/2023   K 4.6 07/02/2023   CL 106 07/02/2023   CO2 23 07/02/2023    Lab Results  Component Value Date   ALT 22 07/02/2023   AST 20 07/02/2023   ALKPHOS 124 (H) 07/02/2023   BILITOT 0.4 07/02/2023    Lab Results  Component Value Date   CHOL 164 07/02/2023    HDL 54 07/02/2023   LDLCALC 100 (H) 07/02/2023   TRIG 49 07/02/2023   CHOLHDL 3.2 03/26/2018    No results found for: "TSH"  No results found  for: "HGBA1C"   Assessment:   1. Encounter for well woman exam with routine gynecological exam   2. Encounter for screening mammogram for malignant neoplasm of breast   3. Cervical cancer screening   4. Breast tenderness in female   5. History of cyst of breast   6. Family history of breast cancer in sister   66. Single nabothian cyst      Plan:  - Pap: Pap Co Test performed today.  If negative patient does not require any further screenings as she will have aged out. - Mammogram: Ordered (diagnostic), along with ultrasound for breast tenderness. - Colon Screening:   UTD , due for repeat after 7 years.   - Labs:  Performed by PCP - Routine preventative health maintenance measures emphasized:  Self Breast Exams, Exercise/Diet/Weight control, Tobacco Warnings, Alcohol/Substance use risks, and Stress Management - Family history of breast cancer (patient also with family history of other cancers as well).  May need to consider hereditary genetic testing if not already performed. - Discussed that nabothian cyst is benign, does not require intervention unless bothersome. - Return to Clinic - 1 Year   Hildred Laser, MD [facility]

## 2023-07-22 NOTE — Patient Instructions (Addendum)
Preventive Care 40-64 Years Old, Female Preventive care refers to lifestyle choices and visits with your health care provider that can promote health and wellness. Preventive care visits are also called wellness exams. What can I expect for my preventive care visit? Counseling Your health care provider may ask you questions about your: Medical history, including: Past medical problems. Family medical history. Pregnancy history. Current health, including: Menstrual cycle. Method of birth control. Emotional well-being. Home life and relationship well-being. Sexual activity and sexual health. Lifestyle, including: Alcohol, nicotine or tobacco, and drug use. Access to firearms. Diet, exercise, and sleep habits. Work and work environment. Sunscreen use. Safety issues such as seatbelt and bike helmet use. Physical exam Your health care provider will check your: Height and weight. These may be used to calculate your BMI (body mass index). BMI is a measurement that tells if you are at a healthy weight. Waist circumference. This measures the distance around your waistline. This measurement also tells if you are at a healthy weight and may help predict your risk of certain diseases, such as type 2 diabetes and high blood pressure. Heart rate and blood pressure. Body temperature. Skin for abnormal spots. What immunizations do I need?  Vaccines are usually given at various ages, according to a schedule. Your health care provider will recommend vaccines for you based on your age, medical history, and lifestyle or other factors, such as travel or where you work. What tests do I need? Screening Your health care provider may recommend screening tests for certain conditions. This may include: Lipid and cholesterol levels. Diabetes screening. This is done by checking your blood sugar (glucose) after you have not eaten for a while (fasting). Pelvic exam and Pap test. Hepatitis B test. Hepatitis C  test. HIV (human immunodeficiency virus) test. STI (sexually transmitted infection) testing, if you are at risk. Lung cancer screening. Colorectal cancer screening. Mammogram. Talk with your health care provider about when you should start having regular mammograms. This may depend on whether you have a family history of breast cancer. BRCA-related cancer screening. This may be done if you have a family history of breast, ovarian, tubal, or peritoneal cancers. Bone density scan. This is done to screen for osteoporosis. Talk with your health care provider about your test results, treatment options, and if necessary, the need for more tests. Follow these instructions at home: Eating and drinking  Eat a diet that includes fresh fruits and vegetables, whole grains, lean protein, and low-fat dairy products. Take vitamin and mineral supplements as recommended by your health care provider. Do not drink alcohol if: Your health care provider tells you not to drink. You are pregnant, may be pregnant, or are planning to become pregnant. If you drink alcohol: Limit how much you have to 0-1 drink a day. Know how much alcohol is in your drink. In the [place] one drink equals one 12 oz bottle of beer (355 mL), one 5 oz glass of wine (148 mL), or one 1 oz glass of hard liquor (44 mL). Lifestyle Brush your teeth every morning and night with fluoride toothpaste. Floss one time each day. Exercise for at least 30 minutes 5 or more days each week. Do not use any products that contain nicotine or tobacco. These products include cigarettes, chewing tobacco, and vaping devices, such as e-cigarettes. If you need help quitting, ask your health care provider. Do not use drugs. If you are sexually active, practice safe sex. Use a condom or other form of protection to   prevent STIs. If you do not wish to become pregnant, use a form of birth control. If you plan to become pregnant, see your health care provider for a  prepregnancy visit. Take aspirin only as told by your health care provider. Make sure that you understand how much to take and what form to take. Work with your health care provider to find out whether it is safe and beneficial for you to take aspirin daily. Find healthy ways to manage stress, such as: Meditation, yoga, or listening to music. Journaling. Talking to a trusted person. Spending time with friends and family. Minimize exposure to UV radiation to reduce your risk of skin cancer. Safety Always wear your seat belt while driving or riding in a vehicle. Do not drive: If you have been drinking alcohol. Do not ride with someone who has been drinking. When you are tired or distracted. While texting. If you have been using any mind-altering substances or drugs. Wear a helmet and other protective equipment during sports activities. If you have firearms in your house, make sure you follow all gun safety procedures. Seek help if you have been physically or sexually abused. What's next? Visit your health care provider once a year for an annual wellness visit. Ask your health care provider how often you should have your eyes and teeth checked. Stay up to date on all vaccines. This information is not intended to replace advice given to you by your health care provider. Make sure you discuss any questions you have with your health care provider. Document Revised: 06/05/2021 Document Reviewed: 06/05/2021 Elsevier Patient Education  2024 Elsevier Inc. Breast Self-Awareness Breast self-awareness is knowing how your breasts look and feel. You need to: Check your breasts on a regular basis. Tell your doctor about any changes. Become familiar with the look and feel of your breasts. This can help you catch a breast problem while it is still small and can be treated. You should do breast self-exams even if you have breast implants. What you need: A mirror. A well-lit room. A pillow or other  soft object. How to do a breast self-exam Follow these steps to do a breast self-exam: Look for changes  Take off all the clothes above your waist. Stand in front of a mirror in a room with good lighting. Put your hands down at your sides. Compare your breasts in the mirror. Look for any difference between them, such as: A difference in shape. A difference in size. Wrinkles, dips, and bumps in one breast and not the other. Look at each breast for changes in the skin, such as: Redness. Scaly areas. Skin that has gotten thicker. Dimpling. Open sores (ulcers). Look for changes in your nipples, such as: Fluid coming out of a nipple. Fluid around a nipple. Bleeding. Dimpling. Redness. A nipple that looks pushed in (retracted), or that has changed position. Feel for changes Lie on your back. Feel each breast. To do this: Pick a breast to feel. Place a pillow under the shoulder closest to that breast. Put the arm closest to that breast behind your head. Feel the nipple area of that breast using the hand of your other arm. Feel the area with the pads of your three middle fingers by making small circles with your fingers. Use light, medium, and firm pressure. Continue the overlapping circles, moving downward over the breast. Keep making circles with your fingers. Stop when you feel your ribs. Start making circles with your fingers again, this time going   upward until you reach your collarbone. Then, make circles outward across your breast and into your armpit area. Squeeze your nipple. Check for discharge and lumps. Repeat these steps to check your other breast. Sit or stand in the tub or shower. With soapy water on your skin, feel each breast the same way you did when you were lying down. Write down what you find Writing down what you find can help you remember what to tell your doctor. Write down: What is normal for each breast. Any changes you find in each breast. These  include: The kind of changes you find. A tender or painful breast. Any lump you find. Write down its size and where it is. When you last had your monthly period (menstrual cycle). General tips If you are breastfeeding, the best time to check your breasts is after you feed your baby or after you use a breast pump. If you get monthly bleeding, the best time to check your breasts is 5-7 days after your monthly cycle ends. With time, you will become comfortable with the self-exam. You will also start to know if there are changes in your breasts. Contact a doctor if: You see a change in the shape or size of your breasts or nipples. You see a change in the skin of your breast or nipples, such as red or scaly skin. You have fluid coming from your nipples that is not normal. You find a new lump or thick area. You have breast pain. You have any concerns about your breast health. Summary Breast self-awareness includes looking for changes in your breasts and feeling for changes within your breasts. You should do breast self-awareness in front of a mirror in a well-lit room. If you get monthly periods (menstrual cycles), the best time to check your breasts is 5-7 days after your period ends. Tell your doctor about any changes you see in your breasts. Changes include changes in size, changes on the skin, painful or tender breasts, or fluid from your nipples that is not normal. This information is not intended to replace advice given to you by your health care provider. Make sure you discuss any questions you have with your health care provider. Document Revised: 05/15/2022 Document Reviewed: 10/10/2021 Elsevier Patient Education  2024 Elsevier Inc.  

## 2023-07-23 ENCOUNTER — Ambulatory Visit
Admission: RE | Admit: 2023-07-23 | Discharge: 2023-07-23 | Disposition: A | Discharge status: Home / Self Care | Payer: Medicare Other | Source: Ambulatory Visit | Attending: Obstetrics and Gynecology | Admitting: Obstetrics and Gynecology

## 2023-07-23 DIAGNOSIS — Z872 Personal history of diseases of the skin and subcutaneous tissue: Secondary | ICD-10-CM | POA: Insufficient documentation

## 2023-07-23 DIAGNOSIS — N644 Mastodynia: Secondary | ICD-10-CM | POA: Insufficient documentation

## 2023-07-23 DIAGNOSIS — Z803 Family history of malignant neoplasm of breast: Secondary | ICD-10-CM | POA: Insufficient documentation

## 2023-07-23 DIAGNOSIS — R92333 Mammographic heterogeneous density, bilateral breasts: Secondary | ICD-10-CM | POA: Diagnosis not present

## 2023-08-06 ENCOUNTER — Ambulatory Visit: Payer: Self-pay | Admitting: *Deleted

## 2023-08-06 NOTE — Telephone Encounter (Signed)
  Chief Complaint: Covid exposure Symptoms: None Frequency: Today Pertinent Negatives: Patient denies any symptoms Disposition: [] ED /[] Urgent Care (no appt availability in office) / [] Appointment(In office/virtual)/ []  [facility]/ [x] Home Care/ [] Refused Recommended Disposition /[]  Mobile Bus/ []  Follow-up with PCP Additional Notes: Exposed at work today, no symptoms. Home care advise provided, pt verbalizes understanding. Reason for Disposition  [1] COVID-19 EXPOSURE within last 14 days AND [2] NO symptoms  Answer Assessment - Initial Assessment Questions 1. NAME of MEDICINE: "What medicine(s) are you calling about?"     Paxlovid 2. QUESTION: "What is your question?" (e.g., double dose of medicine, side effect)     Preventative.  Answer Assessment - Initial Assessment Questions 1. COVID-19 EXPOSURE: "Please describe how you were exposed to someone with a COVID-19 infection."     At workplace 2. PLACE of CONTACT: "Where were you when you were exposed to COVID-19?" (e.g., home, school, medical waiting room; which city?)     Work 3. TYPE of CONTACT: "How much contact was there?" (e.g., sitting next to, live in same house, work in same office, same building)     Next to each other 4. DURATION of CONTACT: "How long were you in contact with the COVID-19 patient?" (e.g., a few seconds, passed by person, a few minutes, 15 minutes or longer, live with the patient)     8 hours 5. MASK: "Were you wearing a mask?" "Was the other person wearing a mask?" Note: wearing a mask reduces the risk of an otherwise close contact.     No 6. DATE of CONTACT: "When did you have contact with a COVID-19 patient?" (e.g., how many days ago)     Today 7. COMMUNITY SPREAD: "Do you live in or have you traveled to an area where there are lots of COVID-19 cases (community spread)?" (See public health department website, if unsure)        8. SYMPTOMS: "Do you have any symptoms?" (e.g., fever,  cough, breathing difficulty, loss of taste or smell)     no 9. VACCINE: "Have you gotten the COVID-19 vaccine?" If Yes, ask: "Which one, how many shots, when did you get it?"     twice 11. HIGH RISK: "Do you have any heart or lung problems?" (e.g., asthma, COPD, heart failure) "Do you have a weak immune system or other risk factors?" (e.g., HIV positive, chemotherapy, renal failure, diabetes mellitus, sickle cell anemia, obesity)       no  Protocols used: Medication Question Call-A-AH, Coronavirus (COVID-19) Exposure-A-AH

## 2023-08-06 NOTE — Telephone Encounter (Signed)
Summary: Pt concerned because her co-worker tested positive for Covid   Pt reports that she worked very closely with a co-worker today who went to the doctor and informed her that she tested positive for Covid. Pt requests Rx because she stated she has a trip coming up. Cb# [phone]       Attempted to reach pt, VM full.

## 2023-08-10 ENCOUNTER — Ambulatory Visit
Admission: EM | Admit: 2023-08-10 | Discharge: 2023-08-10 | Disposition: A | Discharge status: Home / Self Care | Payer: Medicare Other | Attending: Emergency Medicine | Admitting: Emergency Medicine

## 2023-08-10 DIAGNOSIS — U071 COVID-19: Secondary | ICD-10-CM | POA: Diagnosis not present

## 2023-08-10 LAB — SARS CORONAVIRUS 2 BY RT PCR: SARS Coronavirus 2 by RT PCR: POSITIVE — AB

## 2023-08-10 MED ORDER — NIRMATRELVIR/RITONAVIR (PAXLOVID)TABLET: 3.0000 | ORAL_TABLET | Freq: Two times a day (BID) | ORAL | 0 refills | Status: AC

## 2023-08-10 NOTE — ED Triage Notes (Signed)
Pt presents to UC for covid testing due to recent exposure. Denies any active sx's.

## 2023-08-10 NOTE — Discharge Instructions (Addendum)
CDC guidelines state that you must wear a mask for the first 5 days of symptoms when you are around other people.  After 5 days you no longer need to mask as you are no longer considered infectious.  There is no longer need to quarantine unless you have a fever.  If you do have a fever then you need to quarantine until you have been fever free for 24 hours without taking Tylenol and/or ibuprofen.  Use over-the-counter Tylenol and/or ibuprofen according to the package instructions as needed for fever and pain.  Take the Paxlovid twice daily for 5 days for treatment of COVID-19.  Change the administration of your amlodipine to once every other day while you are on the Paxlovid and for 3 days following.  If you have been prescribed Paxlovid take it twice daily as prescribed for 5 days.  If you develop any worsening respiratory symptoms such as shortness of breath, shortness of breath at rest, feel as though you cannot catch your breath, you are unable to speak in full sentences, or, as a late sign, your lips begin turning blue you need to call 911 and go to the ER for evaluation.

## 2023-08-10 NOTE — ED Provider Notes (Signed)
[facility]    CSN: [id number] Arrival date & time: 08/10/23  0803      History   Chief Complaint Chief Complaint  Patient presents with   Covid Exposure    HPI Cheyenne Bean is a 67 y.o. female.   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  She is denying any symptoms at present.  The patient was exposed 4 days ago.  She would like to be tested because she is supposed to go see her elderly father tomorrow.  Past Medical History:  Diagnosis Date   BRCA negative 01/2017   MyRisk neg   Family history of breast cancer    1/18 IBIS=10.7%   Family history of ovarian cancer    MyRisk neg   Genital herpes    Hyperlipidemia    Hypertension    IBS (irritable bowel syndrome) 02/20/2017   Sarcoidosis     Patient Active Problem List   Diagnosis Date Noted   Pure hypercholesterolemia 11/04/2016   Essential hypertension 09/18/2015   Hyperlipidemia 09/18/2015   Recurrent genital herpes simplex 09/18/2015    Past Surgical History:  Procedure Laterality Date   BACK SURGERY     BUNIONECTOMY Left    COLONOSCOPY  2012   cleared for 10 yrs   COLONOSCOPY WITH PROPOFOL N/A 02/27/2017   Procedure: COLONOSCOPY WITH PROPOFOL;  Surgeon: Christena Deem, MD;  Location: [facility] END[facility]COPY;  Service: Endoscopy;  Laterality: N/A;   COLONOSCOPY WITH PROPOFOL N/A 03/30/2023   Procedure: COLONOSCOPY WITH PROPOFOL;  Surgeon: Regis Bill, MD;  Location: [facility] ENDOSCOPY;  Service: Endoscopy;  Laterality: N/A;   FOOT SURGERY Left    VAGINAL HYSTERECTOMY      OB History     Gravida  1   Para  0   Term      Preterm      AB  1   Living  0      SAB      IAB      Ectopic  1   Multiple      Live Births               Home Medications    Prior to Admission medications   Medication Sig Start Date End Date Taking?  Authorizing Provider  amLODipine (NORVASC) 5 MG tablet TAKE 1 & 1/2 (ONE & ONE-HALF) TABLETS BY MOUTH ONCE DAILY = 7.5MG  07/02/23  Yes Duanne Limerick, MD  Ascorbic Acid (VITA-C PO) Take by mouth.   Yes [provider]  BIOTIN PO Take by mouth.   Yes [provider]  Cyanocobalamin (VITAMIN B-12 PO) Take by mouth.   Yes [provider]  ibuprofen (ADVIL) 600 MG tablet Take 1 tablet (600 mg total) by mouth every 6 (six) hours as needed. 02/02/22  Yes Domenick Gong, MD  linaclotide [facility]) 72 MCG capsule Take by mouth. 07/09/23  Yes [provider]  meclizine (ANTIVERT) 25 MG tablet Take 1 tablet (25 mg total) by mouth as needed for dizziness. 10/17/21  Yes Duanne Limerick, MD  meloxicam (MOBIC) 7.5 MG tablet Take 1 tablet by mouth once daily 05/28/23  Yes Duanne Limerick, MD  Multiple Vitamin (MULTIVITAMIN) tablet Take 1 tablet by mouth daily.   Yes [provider]  nirmatrelvir/ritonavir (PAXLOVID) 20 x 150 MG & 10 x 100MG  TABS Take 3 tablets by mouth 2 (two) times  daily for 5 days. 08/10/23 08/15/23 Yes Becky Augusta, NP  pravastatin (PRAVACHOL) 20 MG tablet Take 1 tablet (20 mg total) by mouth daily. 07/02/23  Yes Duanne Limerick, MD  valACYclovir (VALTREX) 500 MG tablet Take 1 tablet (500 mg total) by mouth daily. 07/02/23  Yes Duanne Limerick, MD  VITAMIN D PO Take by mouth.   Yes [provider]  VITAMIN E PO Take by mouth.   Yes [provider]    Family History Family History  Problem Relation Age of Onset   Hypertension Mother    Breast cancer Sister 34   Cervical cancer Sister 52   Ovarian cancer Maternal Aunt 11   Colon cancer Maternal Uncle 65    Social History Social History   Tobacco Use   Smoking status: Never   Smokeless tobacco: Never  Vaping Use   Vaping status: Never Used  Substance Use Topics   Alcohol use: No    Alcohol/week: 0.0 standard drinks of alcohol   Drug use: No     Allergies   Patient  has no known allergies.   Review of Systems Review of Systems  Constitutional:  Negative for fever.  HENT:  Negative for congestion, rhinorrhea and sore throat.   Respiratory:  Negative for cough, shortness of breath and wheezing.      Physical Exam Triage Vital Signs ED Triage Vitals  Encounter Vitals Group     BP      Systolic BP Percentile      Diastolic BP Percentile      Pulse      Resp      Temp      Temp src      SpO2      Weight      Height      Head Circumference      Peak Flow      Pain Score      Pain Loc      Pain Education      Exclude from Growth Chart    No data found.  Updated Vital Signs BP (!) 146/89 (BP Location: Left Arm)   Pulse 68   Temp 98.7 F (37.1 C) (Oral)   Resp 16   Ht 5\' 4"  (1.626 m)   Wt 148 lb (67.1 kg)   SpO2 95%   BMI 25.40 kg/m   Visual Acuity Right Eye Distance:   Left Eye Distance:   Bilateral Distance:    Right Eye Near:   Left Eye Near:    Bilateral Near:     Physical Exam Vitals and nursing note reviewed.  Constitutional:      Appearance: Normal appearance. She is not ill-appearing.  HENT:     Head: Normocephalic and atraumatic.  Cardiovascular:     Rate and Rhythm: Normal rate and regular rhythm.     Pulses: Normal pulses.     Heart sounds: Normal heart sounds. No murmur heard.    No friction rub. No gallop.  Pulmonary:     Effort: Pulmonary effort is normal.     Breath sounds: Normal breath sounds. No wheezing, rhonchi or rales.  Skin:    General: Skin is warm and dry.     Capillary Refill: Capillary refill takes less than 2 seconds.     Findings: No rash.  Neurological:     General: No focal deficit present.     Mental Status: She is alert and oriented to person, place, and time.  UC Treatments / Results  Labs (all labs ordered are listed, but only abnormal results are displayed) Labs Reviewed  SARS CORONAVIRUS 2 BY RT PCR - Abnormal; Notable for the following components:      Result  Value   SARS Coronavirus 2 by RT PCR POSITIVE (*)    All other components within normal limits    EKG   Radiology No results found.  Procedures Procedures (including critical care time)  Medications Ordered in UC Medications - No data to display  Initial Impression / Assessment and Plan / UC Course  I have reviewed the triage vital signs and the nursing notes.  Pertinent labs & imaging results that were available during my care of the patient were reviewed by me and considered in my medical decision making (see chart for details).   The patient is a pleasant, nontoxic-appearing 67 year old female presenting for COVID testing in the setting of a recent exposure 4 days ago.  She denies any upper or lower respiratory symptoms and she is only requesting testing because she is supposed to see her elderly father tomorrow.  I did advise her that it may take up to 2 weeks postexposure for her to do turn positive, so even if she is negative today I would recommend that she wear a mask around her father to be safe.  I will order a COVID PCR.  COVID PCR is positive.  I will discharge patient home on Paxlovid twice daily for 5 days for treatment of COVID-19 due to her comorbidities.  CMP from 07/02/23 shows her GFR is 92.  I have advised her to take her amlodipine every other day while she is on the Paxlovid and for 3 days following finishing the prescription.  Return and ER precautions reviewed.   Final Clinical Impressions(s) / UC Diagnoses   Final diagnoses:  COVID-19     Discharge Instructions      CDC guidelines state that you must wear a mask for the first 5 days of symptoms when you are around other people.  After 5 days you no longer need to mask as you are no longer considered infectious.  There is no longer need to quarantine unless you have a fever.  If you do have a fever then you need to quarantine until you have been fever free for 24 hours without taking Tylenol and/or  ibuprofen.  Use over-the-counter Tylenol and/or ibuprofen according to the package instructions as needed for fever and pain.  Take the Paxlovid twice daily for 5 days for treatment of COVID-19.  Change the administration of your amlodipine to once every other day while you are on the Paxlovid and for 3 days following.  If you have been prescribed Paxlovid take it twice daily as prescribed for 5 days.  If you develop any worsening respiratory symptoms such as shortness of breath, shortness of breath at rest, feel as though you cannot catch your breath, you are unable to speak in full sentences, or, as a late sign, your lips begin turning blue you need to call 911 and go to the ER for evaluation.      ED Prescriptions     Medication Sig Dispense Auth. Provider   nirmatrelvir/ritonavir (PAXLOVID) 20 x 150 MG & 10 x 100MG  TABS Take 3 tablets by mouth 2 (two) times daily for 5 days. 30 tablet Becky Augusta, NP      PDMP not reviewed this encounter.   Becky Augusta, NP 08/10/23 [phone]

## 2023-10-02 DIAGNOSIS — H524 Presbyopia: Secondary | ICD-10-CM | POA: Diagnosis not present

## 2023-11-23 ENCOUNTER — Telehealth: Payer: Self-pay

## 2023-11-23 ENCOUNTER — Ambulatory Visit: Payer: Self-pay | Admitting: *Deleted

## 2023-11-23 NOTE — Telephone Encounter (Signed)
Summary: Possible yeast infection, seeking Rx   Pt called requesting to receive a prescription for a yeast infection, she says that she does not want to come in for an appt because she has already been seen for this before         Chief Complaint: Vaginal Discharge Symptoms: "Cottage cheese like discharge, mild, slight vaginal itching. Frequency: 1 week Pertinent Negatives: Patient denies pain,rash,odor Disposition: [] ED /[] Urgent Care (no appt availability in office) / [] Appointment(In office/virtual)/ []  [facility]/ [] Home Care/ [] Refused Recommended Disposition /[] [facility]/ [x]  Follow-up with PCP Additional Notes:  Pt declined appt. States "I know what this is and my doctor has treated me for this, I just want that one pill that clears it up fast." Has not tried any OTC products. Advised may need appt. Please advise.  Reason for Disposition  Symptoms of a vaginal yeast infection (i.e., white, thick, cottage-cheese-like, itchy, not bad smelling discharge)  Answer Assessment - Initial Assessment Questions 1. DISCHARGE: "Describe the discharge." (e.g., white, yellow, green, gray, foamy, cottage cheese-like)     Cottage cheese like 2. ODOR: "Is there a bad odor?"     No 3. ONSET: "When did the discharge begin?"     Last week 4. RASH: "Is there a rash in the genital area?" If Yes, ask: "Describe it." (e.g., redness, blisters, sores, bumps)     no 5. ABDOMEN PAIN: "Are you having any abdomen pain?" If Yes, ask: "What does it feel like? " (e.g., crampy, dull, intermittent, constant)      no 6. ABDOMEN PAIN SEVERITY: If present, ask: "How bad is it?" (e.g., Scale 1-10; mild, moderate, or severe)   - MILD (1-3): Doesn't interfere with normal activities, abdomen soft and not tender to touch.    - MODERATE (4-7): Interferes with normal activities or awakens from sleep, abdomen tender to touch.    - SEVERE (8-10): Excruciating pain, doubled over, unable to do any  normal activities. (R/O peritonitis)      Mild 7. CAUSE: "What do you think is causing the discharge?" "Have you had the same problem before? What happened then?"     Yeast infection 8. OTHER SYMPTOMS: "Do you have any other symptoms?" (e.g., fever, itching, vaginal bleeding, pain with urination, injury to genital area, vaginal foreign body)     Slight itching  Protocols used: Vaginal Discharge-A-AH

## 2023-11-23 NOTE — Telephone Encounter (Signed)
Patient states she is a former patient of Dr. Bonney Aid. She has frequent yeast infections. He would always send her rx for Diflucan. She sees Dr. Valentino Saxon now. Advised no recent +yeast result, she would need to be seen for a self swab or she can use OTC Monistat 7. Patient will contact primary and call us back to schedule if they are unable to send rx.

## 2023-11-24 ENCOUNTER — Ambulatory Visit (INDEPENDENT_AMBULATORY_CARE_PROVIDER_SITE_OTHER): Payer: Medicare HMO | Admitting: Physician Assistant

## 2023-11-24 ENCOUNTER — Encounter: Payer: Self-pay | Admitting: Physician Assistant

## 2023-11-24 VITALS — BP 138/78 | HR 85 | Temp 98.4°F | Ht 64.0 in | Wt 149.0 lb

## 2023-11-24 DIAGNOSIS — B3731 Acute candidiasis of vulva and vagina: Secondary | ICD-10-CM | POA: Diagnosis not present

## 2023-11-24 DIAGNOSIS — Z23 Encounter for immunization: Secondary | ICD-10-CM | POA: Diagnosis not present

## 2023-11-24 DIAGNOSIS — R350 Frequency of micturition: Secondary | ICD-10-CM | POA: Diagnosis not present

## 2023-11-24 LAB — POCT URINALYSIS DIPSTICK
Bilirubin, UA: NEGATIVE
Blood, UA: NEGATIVE
Glucose, UA: NEGATIVE
Ketones, UA: NEGATIVE
Leukocytes, UA: NEGATIVE
Nitrite, UA: NEGATIVE
Protein, UA: NEGATIVE
Spec Grav, UA: >=1.03 — AB (ref 1.010–1.025)
Urobilinogen, UA: 0.2 U/dL
pH, UA: 6 (ref 5.0–8.0)

## 2023-11-24 MED ORDER — FLUCONAZOLE 150 MG PO TABS
150.0000 mg | ORAL_TABLET | ORAL | 2 refills | Status: AC
Start: 2023-11-24 — End: 2023-11-27

## 2023-11-24 NOTE — Progress Notes (Signed)
Date:  11/24/2023   Name:  Larelle Arbon   DOB:  1956-11-01   MRN:  [id number]   Chief Complaint: Vaginitis  Vaginal Discharge The patient's primary symptoms include a genital odor and vaginal discharge. The patient's pertinent negatives include no pelvic pain. This is a new problem. Episode onset: X1 week. The problem has been unchanged. The patient is experiencing no pain. She is not pregnant. Associated symptoms include back pain, frequency and urgency. Pertinent negatives include no chills, dysuria, fever or hematuria. The vaginal discharge was thick. There has been no bleeding. She has not been passing clots. She has not been passing tissue. Nothing aggravates the symptoms. She has tried nothing for the symptoms. She is sexually active. It is unknown whether or not her partner has an STD. She uses nothing for contraception. She is postmenopausal.   Itzell is a very pleasant 67 year old female new to me today, typically seen by my colleague Dr. Yetta Barre, here for evaluation of 1 week vaginal discomfort reporting thick white discharge and foul odor.  Says this feels like previous yeast infections, the last of which she estimates was 5 to 6 months ago.  Usually clears up quickly with oral fluconazole.  She has not tried anything OTC for this current episode.  Endorses urinary frequency and urgency at baseline, no change with the current episode.   Medication list has been reviewed and updated.  Current Meds  Medication Sig   amLODipine (NORVASC) 5 MG tablet TAKE 1 & 1/2 (ONE & ONE-HALF) TABLETS BY MOUTH ONCE DAILY = 7.5MG    Ascorbic Acid (VITA-C PO) Take by mouth.   BIOTIN PO Take by mouth.   Cyanocobalamin (VITAMIN B-12 PO) Take by mouth.   fluconazole (DIFLUCAN) 150 MG tablet Take 1 tablet (150 mg total) by mouth as directed for 3 days. Take 1 tablet by mouth.  If no improvement after 72 hours, you may repeat dose x1.   ibuprofen (ADVIL) 600 MG tablet Take 1 tablet (600 mg total) by  mouth every 6 (six) hours as needed.   linaclotide (LINZESS) 72 MCG capsule Take by mouth.   meclizine (ANTIVERT) 25 MG tablet Take 1 tablet (25 mg total) by mouth as needed for dizziness.   meloxicam (MOBIC) 7.5 MG tablet Take 1 tablet by mouth once daily   Multiple Vitamin (MULTIVITAMIN) tablet Take 1 tablet by mouth daily.   pravastatin (PRAVACHOL) 20 MG tablet Take 1 tablet (20 mg total) by mouth daily.   valACYclovir (VALTREX) 500 MG tablet Take 1 tablet (500 mg total) by mouth daily.   VITAMIN D PO Take by mouth.   VITAMIN E PO Take by mouth.     Review of Systems  Constitutional:  Negative for chills, fatigue and fever.  Genitourinary:  Positive for frequency, urgency and vaginal discharge. Negative for dysuria, hematuria, pelvic pain, vaginal bleeding and vaginal pain.  Musculoskeletal:  Positive for back pain.    Patient Active Problem List   Diagnosis Date Noted   Pure hypercholesterolemia 11/04/2016   Essential hypertension 09/18/2015   Hyperlipidemia 09/18/2015   Recurrent genital herpes simplex 09/18/2015    No Known Allergies  Immunization History  Administered Date(s) Administered   Fluad Trivalent(High Dose 65+) 11/24/2023   Influenza,inj,Quad PF,6+ Mos 01/02/2017, 03/16/2018, 10/15/2018, 11/08/2019, 10/22/2020, 11/28/2020, 10/14/2021, 12/29/2022   Influenza-Unspecified 01/02/2017, 03/16/2018, 10/15/2018, 11/08/2019, 12/25/2022   PFIZER Comirnaty(Gray Top)Covid-19 Tri-Sucrose Vaccine 03/06/2020, 03/27/2020, 10/22/2020   PFIZER(Purple Top)SARS-COV-2 Vaccination 03/06/2020, 03/27/2020   Pneumococcal Polysaccharide-23 04/16/2010  Past Surgical History:  Procedure Laterality Date   BACK SURGERY     BUNIONECTOMY Left    COLONOSCOPY  2012   cleared for 10 yrs   COLONOSCOPY WITH PROPOFOL N/A 02/27/2017   Procedure: COLONOSCOPY WITH PROPOFOL;  Surgeon: Christena Deem, MD;  Location: [facility] ENDOSCOPY;  Service: Endoscopy;  Laterality: N/A;   COLONOSCOPY WITH  PROPOFOL N/A 03/30/2023   Procedure: COLONOSCOPY WITH PROPOFOL;  Surgeon: Regis Bill, MD;  Location: [facility] ENDOSCOPY;  Service: Endoscopy;  Laterality: N/A;   FOOT SURGERY Left    VAGINAL HYSTERECTOMY      Social History   Tobacco Use   Smoking status: Never   Smokeless tobacco: Never  Vaping Use   Vaping status: Never Used  Substance Use Topics   Alcohol use: No    Alcohol/week: 0.0 standard drinks of alcohol   Drug use: No    Family History  Problem Relation Age of Onset   Hypertension Mother    Breast cancer Sister 19   Cervical cancer Sister 77   Ovarian cancer Maternal Aunt 60   Colon cancer Maternal Uncle 50        11/24/2023    8:31 AM 07/02/2023   10:26 AM 06/15/2023   10:49 AM 03/26/2023   10:26 AM  GAD 7 : Generalized Anxiety Score  Nervous, Anxious, on Edge 0 0 0 0  Control/stop worrying 0 0 0 0  Worry too much - different things 0 0 0 0  Trouble relaxing 0 0 0 0  Restless 0 0 0 0  Easily annoyed or irritable 0 0 0 1  Afraid - awful might happen 0 0 0 0  Total GAD 7 Score 0 0 0 1  Anxiety Difficulty Not difficult at all Not difficult at all Not difficult at all Not difficult at all       11/24/2023    8:30 AM 07/22/2023    1:18 PM 07/02/2023   10:25 AM  Depression screen PHQ 2/9  Decreased Interest 0 0 0  Down, Depressed, Hopeless 0 0 0  PHQ - 2 Score 0 0 0  Altered sleeping 0  0  Tired, decreased energy 0  0  Change in appetite 0  0  Feeling bad or failure about yourself  0  0  Trouble concentrating 0  0  Moving slowly or fidgety/restless 0  0  Suicidal thoughts 0  0  PHQ-9 Score 0  0  Difficult doing work/chores Not difficult at all  Not difficult at all    BP Readings from Last 3 Encounters:  11/24/23 138/78  08/10/23 (!) 146/89  07/22/23 133/78    Wt Readings from Last 3 Encounters:  11/24/23 149 lb (67.6 kg)  08/10/23 148 lb (67.1 kg)  07/22/23 148 lb (67.1 kg)    BP 138/78   Pulse 85   Temp 98.4 F (36.9 C) (Oral)   Ht  5\' 4"  (1.626 m)   Wt 149 lb (67.6 kg)   SpO2 99%   BMI 25.58 kg/m   Physical Exam Vitals and nursing note reviewed.  Constitutional:      Appearance: Normal appearance.  Cardiovascular:     Rate and Rhythm: Normal rate and regular rhythm.     Heart sounds: No murmur heard.    No friction rub. No gallop.  Pulmonary:     Effort: Pulmonary effort is normal.     Breath sounds: Normal breath sounds.  Abdominal:     General: There is  no distension.     Tenderness: There is no abdominal tenderness. There is no right CVA tenderness or left CVA tenderness.  Musculoskeletal:        General: Normal range of motion.  Skin:    General: Skin is warm and dry.  Neurological:     Mental Status: She is alert and oriented to person, place, and time.     Gait: Gait is intact.  Psychiatric:        Mood and Affect: Mood and affect normal.     Recent Labs     Component Value Date/Time   NA 143 07/02/2023 1054   K 4.6 07/02/2023 1054   CL 106 07/02/2023 1054   CO2 23 07/02/2023 1054   GLUCOSE 95 07/02/2023 1054   GLUCOSE 110 (H) 12/13/2020 1417   BUN 16 07/02/2023 1054   CREATININE 0.73 07/02/2023 1054   CALCIUM 9.7 07/02/2023 1054   PROT 7.7 07/02/2023 1054   ALBUMIN 4.6 07/02/2023 1054   AST 20 07/02/2023 1054   ALT 22 07/02/2023 1054   ALKPHOS 124 (H) 07/02/2023 1054   BILITOT 0.4 07/02/2023 1054   GFRNONAA >60 12/13/2020 1417   GFRAA 99 04/03/2020 1142    Lab Results  Component Value Date   WBC 5.2 12/27/2021   HGB 14.0 12/27/2021   HCT 41.3 12/27/2021   MCV 87 12/27/2021   PLT 236 12/27/2021   No results found for: "HGBA1C" Lab Results  Component Value Date   CHOL 164 07/02/2023   HDL 54 07/02/2023   LDLCALC 100 (H) 07/02/2023   TRIG 49 07/02/2023   CHOLHDL 3.2 03/26/2018   No results found for: "TSH"   Assessment and Plan:  1. Vaginal candidiasis Will treat presumptively with fluconazole as below based on clinical symptoms.  Patient to let me know if not  improving. - fluconazole (DIFLUCAN) 150 MG tablet; Take 1 tablet (150 mg total) by mouth as directed for 3 days. Take 1 tablet by mouth.  If no improvement after 72 hours, you may repeat dose x1.  Dispense: 2 tablet; Refill: 2  2. Urinary frequency Urine clear today - POCT urinalysis dipstick  3. Need for influenza vaccination Flu vaccine administered today - Flu Vaccine Trivalent High Dose (Fluad)    Return if symptoms worsen or fail to improve.    Alvester Morin, PA-C, DMSc, Nutritionist [facility] [phone]

## 2023-11-24 NOTE — Patient Instructions (Signed)
-

## 2023-12-27 ENCOUNTER — Other Ambulatory Visit: Payer: Self-pay | Admitting: Family Medicine

## 2023-12-27 DIAGNOSIS — E7800 Pure hypercholesterolemia, unspecified: Secondary | ICD-10-CM

## 2024-01-18 ENCOUNTER — Other Ambulatory Visit: Payer: Self-pay | Admitting: Family Medicine

## 2024-01-18 DIAGNOSIS — I10 Essential (primary) hypertension: Secondary | ICD-10-CM

## 2024-02-05 ENCOUNTER — Other Ambulatory Visit: Payer: Self-pay | Admitting: Family Medicine

## 2024-02-05 DIAGNOSIS — Z1231 Encounter for screening mammogram for malignant neoplasm of breast: Secondary | ICD-10-CM

## 2024-02-12 DIAGNOSIS — R0602 Shortness of breath: Secondary | ICD-10-CM | POA: Diagnosis not present

## 2024-02-12 DIAGNOSIS — D860 Sarcoidosis of lung: Secondary | ICD-10-CM | POA: Diagnosis not present

## 2024-02-12 DIAGNOSIS — D869 Sarcoidosis, unspecified: Secondary | ICD-10-CM | POA: Diagnosis not present

## 2024-02-12 NOTE — Procedures (Signed)
 6 Minute Walk Test   Cheyenne Bean MRN:  [id number]  CSN:   [id number] Date of Service: 02/12/2024 Date of Birth: 04-10-1956, 68 y.o. Gender:  female Ordering Physician:    FRIED      Height:  64  inches      Weight:  152 lbs  ICD-10:  R06.02  Scale of Wellness: Normal  Walk Assistance: None   Resting Room Air O2 saturation by oximetry  99 %  Therapists Comments// Physical limitations unrelated to dyspnea or fatigue:  PATRICK K DIXON,RCP                                   Summary Data   Start Exercise        End Exercise  X Room Air        Nasal Cannula /LPM       Oxymizer Cannula /LPM       Rebreather Mask /LPM       Heart Rate 88/BPM  Max Heart Rate 122/BPM 79%pred.   SpO2 99%  Lowest SpO2 98%    Borg Dyspnea Scale (Perceived Breathlessness):  1  Distance Walked:  518.5 Meters         1701 Feet        109 % Predicted  Did patient require rest periods? no Reason(s) for resting: (Check all that apply) na  Physician Interp:  Six minute walk distance is within the normal predicted range. Oxygenation during exercise was adequate (SpO2 =>90%) without the use of supplemental O2. At peak exercise, the heart rate indicated cardiovascular maximums were being approached. Perceived dyspnea at end of exercise was absent or mild. I personally performed the service. (TP) ZACHARY RYAN HEALY, MD

## 2024-02-12 NOTE — Progress Notes (Signed)
 INTERSTITIAL LUNG DISEASE PROGRAM   PMD: Joshua Cathryne Rodney, MD  Problem List: 1.  Sarcoidosis - currently in remission  - diagnosed in 2005 based upon cervical node biopsy; had cough at time of diagnosis  - getting routine eye care - no evidence of ocular involvement  - no known extrapulmonary manifestations of sarcoidosis 2.  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 female with PMH significant for sarcoidosis: diagnosed in 2005 based upon cervical node biopsy - had significant cough at the time she was diagnosed; treated with prednisone; no pulmonary sequelae since.  She established care for longitudinal follow up in 12/2019 for her sarcoidosis.  At that time, she had been getting routine eye care.  She had no known extra pulmonary manifestations of sarcoidosis.     Interval History:   About a year since we last saw the patient in our clinic. No health issues in the past year. No respiratory symptoms. Walks regularly at work but no dedicated exercise.   Still having routine eye exams every year.  No new skin rashes or other changes.    Current Functional Status:  Oxygen: none Level of exertion that leads to dyspnea: none  Rehab: none  Review of Systems: ROS  + heartburn (occasionally)   A thorough review of 10 organ systems is otherwise negative.   Current Medications: Current Outpatient Medications  Medication Sig Dispense Refill  . albuterol MDI, PROVENTIL, VENTOLIN, PROAIR, HFA 90 mcg/actuation inhaler Inhale 2 inhalations into the lungs every 6 (six) hours as needed for Wheezing 1 each 11  . amLODIPine  (NORVASC ) 5 MG tablet Take 1.5 tablets (7.5 mg total) by mouth once daily 45 tablet 0  . amoxicillin (AMOXIL) 500 MG tablet Take 500 mg by mouth every 8 (eight) hours 02/14/22 - interrupted then resumed - last dose 03/06/22    . ascorbic  acid (VITAMIN C ORAL) Take by mouth once daily    . BIOTIN ORAL Take by mouth    . cyanocobalamin, vitamin B-12, (VITAMIN B-12 ORAL) Take 1,000 mcg by mouth once daily    . ergocalciferol, vitamin D2, (VITAMIN D2 ORAL) Take by mouth    . fluticasone  propionate (FLONASE ) 50 mcg/actuation nasal spray Place 2 sprays into both nostrils once daily 16 g 3  . linaCLOtide (LINZESS) 72 mcg capsule Take 1 capsule (72 mcg total) by mouth once daily 30 capsule 11  . meclizine  (ANTIVERT ) 12.5 mg tablet Take 12.5 mg by mouth 3 (three) times daily as needed for Dizziness    . multivit-minerals/folic acid (MULTIVITAMIN GUMMIES ORAL) Take 1 tablet by mouth once daily Tablet not the gummy    . nystatin -triamcinolone  ointment Apply 1 Dose topically 2 (two) times daily As directed    . pravastatin  (PRAVACHOL ) 20 MG tablet Take 20 mg by mouth once daily    . valACYclovir  (VALTREX ) 500 MG tablet Take 500 mg by mouth once daily.    . IDS Study Medication 1 each (Patient not taking: Reported on 02/12/2024)     No current facility-administered medications for this visit.    Physical Exam:  BP 129/73 (BP Location: Right upper arm, Patient Position: Sitting, BP Cuff Size: Adult)   Pulse 86   Temp 36.5 C (97.7 F) (Oral)   Resp 16   Ht 162.6 cm (5' 4)   Wt 68.2 kg (150 lb  5.7 oz)   LMP  (LMP Unknown)   SpO2 94%   BMI 25.81 kg/m  Constitutional: Well appearing, well nourished, NAD; comes to clinic alone HEENT: EOMI,  OP clear Neck: No lymphadenopathy, No JVD Respiratory: CTAB, no wheezing noted, unchanged today Cardiovascular: RRR, no murmurs Extremities: No clubbing, cyanosis, or edema   Skin: no rashes or other skin changes noted    Musculoskeletal:  5/5 strength in bilateral upper and lower extremities, no swelling in the joints of the hands Neuro: grossly intact Psych: normal affect   Laboratory Data/Test Results: I personally reviewed and interpreted pulmonary function tests performed today which  reveal FVC 102% predicted; FEV1 103% predicted.  No obstruction. DLCO 120% predicted.  When we compare today's pulmonary function tests to pulmonary function tests performed previously we see overall stability.   Normal lung function     Latest Ref Rng & Units 01/16/2020    7:24 AM 01/14/2021    7:37 AM 01/20/2022    8:33 AM 01/30/2023   10:24 AM 02/12/2024    9:53 AM  Pulmonary Function Test (PFT)  FVC Pre L 3.13  2.72  2.87  2.71  2.94  P  FVC_%PRED % 116 %  99 %  106 %  101 %  102 %  P  FVC_Z-SCORE  0.96    0.96  -0.05    -0.05  0.34  0.06  0.12  P  FEV1 Pre L 2.49  2.14  2.27  2.10  2.33  P  FEV1_%PRED % 117 %  99 %  106 %  99 %  103 %  P  FEV1/FVC Pre % 79.53  78.66  79.15  77.34  79.23  P  FEV1_Z-SCORE  1.03    1.03  -0.08    -0.08  0.38  -0.04  0.16  P  FEF25-75% Pre L/s 2.67  2.10  2.05  1.94  2.43  P  FEF25-75%_%PRED % 136 %  107 %  106 %  103 %  124 %  P  FEF25-75%_Z-SCORE  0.78    0.78  0.16    0.16  0.14  0.07  0.62  P  TLC Pre L 3.92       TLC_%PRED % 78.9       RV Pre L 0.79       RV_%PRED % 41.0       DLCO Pre ml/(min*mmHg) 21.01  21.06  21.92  20.35  23.39  P  DLCOSINGLEBREATH_%PRED % 20.25    103.7  20.71    101.7  106.2  99.0  120.5  P  DLCOSINGLEBREATH_Z-SCORE  0.21    0.21  0.09    0.09  0.35  -0.06  1.18  P    P Preliminary result   02/12/2024 : Personally reviewed and interpreted. Ambulated for 6 minutes on room air with O2 nadir of 98%. Walk distance 518 meters (109% predicted). Heart rate indicates cardiovascular maximums were being approached. Perceived dyspnea was mild (Borg 1)   Impression: Patient is a 68 y.o. female with history of sarcoidosis, currently in remission with no pulmonary symptoms and normal PFTs.  I do not see any need today to add any therapy directed towards her sarcoidosis.  We will continue with yearly follow-up for ongoing evaluation.  Plan:    - no intervention for sarcoid at this time as it appears to be in remission -  patient with normal PFTs, continue yearly follow up - encouraged exercise as tolerated  -  stay up to date with vaccinations -- due for Prevnar, she will obtain from local pharmacy   Return in about 1 year (around 02/11/2025).  Immunization History  Administered Date(s) Administered  . COVID-19 Pfizer Monovalent Vaccine 03/06/2020, 03/27/2020, 10/22/2020  . Flu Vaccine allV3 adjuv,IM PF(65+)(Fluad) 11/24/2023  . Influenza IIV4, IM Pres-free (18-64) 10/22/2020  . Influenza IIV4, IM pres-free 11/28/2020, 10/14/2021, 12/29/2022  . Influenza, IM unspecified 01/02/2017, 03/16/2018, 10/15/2018, 11/08/2019, 12/25/2022  . PNEUMOCOCCAL (PPSV23)(>=50YRS -OR- >=2 YRS WITH RISK) VACCINE (PNEUMOVAX 23) 04/16/2010    Attestation Statement:   I personally performed the service. (TP)  MLISS ASH, MD Assistant Professor of Medicine Division of Pulmonary, Allergy and Critical Care Medicine

## 2024-02-15 ENCOUNTER — Ambulatory Visit
Admission: RE | Admit: 2024-02-15 | Discharge: 2024-02-15 | Disposition: A | Discharge status: Home / Self Care | Payer: Medicare Other | Source: Ambulatory Visit | Attending: Family Medicine | Admitting: Family Medicine

## 2024-02-15 DIAGNOSIS — Z1231 Encounter for screening mammogram for malignant neoplasm of breast: Secondary | ICD-10-CM | POA: Diagnosis not present

## 2024-03-17 ENCOUNTER — Ambulatory Visit
Admission: RE | Admit: 2024-03-17 | Discharge: 2024-03-17 | Disposition: A | Discharge status: Home / Self Care | Source: Ambulatory Visit | Attending: Family Medicine | Admitting: Family Medicine

## 2024-03-17 ENCOUNTER — Ambulatory Visit
Admission: RE | Admit: 2024-03-17 | Discharge: 2024-03-17 | Disposition: A | Discharge status: Home / Self Care | Attending: Family Medicine | Admitting: Family Medicine

## 2024-03-17 ENCOUNTER — Encounter: Payer: Self-pay | Admitting: Family Medicine

## 2024-03-17 ENCOUNTER — Other Ambulatory Visit: Payer: Self-pay | Admitting: Family Medicine

## 2024-03-17 ENCOUNTER — Ambulatory Visit (INDEPENDENT_AMBULATORY_CARE_PROVIDER_SITE_OTHER): Admitting: Family Medicine

## 2024-03-17 VITALS — BP 138/84 | HR 85 | Resp 16 | Ht 64.0 in | Wt 155.0 lb

## 2024-03-17 DIAGNOSIS — M7051 Other bursitis of knee, right knee: Secondary | ICD-10-CM

## 2024-03-17 DIAGNOSIS — M25561 Pain in right knee: Secondary | ICD-10-CM | POA: Diagnosis not present

## 2024-03-17 DIAGNOSIS — I10 Essential (primary) hypertension: Secondary | ICD-10-CM

## 2024-03-17 NOTE — Progress Notes (Signed)
 Date:  03/17/2024   Name:  Cheyenne Bean   DOB:  1956/07/10   MRN:  [id number]   Chief Complaint: Knee Pain (Pain in R knee sometimes stiffens up and she was out of work for a month just returned and after being on feet all day she had increased pain. ) and Mass (Left neck area no pain x 3 weeks.)  Knee Pain  The incident occurred more than 1 week ago (2 weeks). There was no injury mechanism. The pain is present in the right knee. The quality of the pain is described as aching. The pain is at a severity of 6/10. The pain is moderate. The pain has been Intermittent since onset. Pertinent negatives include no inability to bear weight, loss of motion, loss of sensation, muscle weakness, numbness or tingling. The symptoms are aggravated by movement (steps). She has tried ice for the symptoms.    Lab Results  Component Value Date   NA 143 07/02/2023   K 4.6 07/02/2023   CO2 23 07/02/2023   GLUCOSE 95 07/02/2023   BUN 16 07/02/2023   CREATININE 0.73 07/02/2023   CALCIUM 9.7 07/02/2023   EGFR 92 07/02/2023   GFRNONAA >60 12/13/2020   Lab Results  Component Value Date   CHOL 164 07/02/2023   HDL 54 07/02/2023   LDLCALC 100 (H) 07/02/2023   TRIG 49 07/02/2023   CHOLHDL 3.2 03/26/2018   No results found for: "TSH" No results found for: "HGBA1C" Lab Results  Component Value Date   WBC 5.2 12/27/2021   HGB 14.0 12/27/2021   HCT 41.3 12/27/2021   MCV 87 12/27/2021   PLT 236 12/27/2021   Lab Results  Component Value Date   ALT 22 07/02/2023   AST 20 07/02/2023   ALKPHOS 124 (H) 07/02/2023   BILITOT 0.4 07/02/2023   No results found for: "25OHVITD2", "25OHVITD3", "VD25OH"   Review of Systems  Constitutional: Negative.  Negative for chills, fatigue, fever and unexpected weight change.  HENT:  Negative for congestion, ear discharge, ear pain, rhinorrhea, sinus pressure, sneezing and sore throat.   Respiratory:  Negative for cough, chest tightness, shortness of breath,  wheezing and stridor.   Cardiovascular:  Negative for chest pain, palpitations and leg swelling.  Gastrointestinal:  Negative for abdominal pain, blood in stool, constipation, diarrhea and nausea.  Genitourinary:  Negative for dysuria, flank pain, frequency, hematuria, urgency and vaginal discharge.  Musculoskeletal:  Negative for arthralgias, back pain and myalgias.  Skin:  Negative for rash.  Neurological:  Negative for dizziness, tingling, weakness, numbness and headaches.  Hematological:  Negative for adenopathy. Does not bruise/bleed easily.  Psychiatric/Behavioral:  Negative for dysphoric mood. The patient is not nervous/anxious.     Patient Active Problem List   Diagnosis Date Noted   Pure hypercholesterolemia 11/04/2016   Essential hypertension 09/18/2015   Hyperlipidemia 09/18/2015   Recurrent genital herpes simplex 09/18/2015    No Known Allergies  Past Surgical History:  Procedure Laterality Date   BACK SURGERY     BUNIONECTOMY Left    COLONOSCOPY  2012   cleared for 10 yrs   COLONOSCOPY WITH PROPOFOL N/A 02/27/2017   Procedure: COLONOSCOPY WITH PROPOFOL;  Surgeon: Christena Deem, MD;  Location: [facility] ENDOSCOPY;  Service: Endoscopy;  Laterality: N/A;   COLONOSCOPY WITH PROPOFOL N/A 03/30/2023   Procedure: COLONOSCOPY WITH PROPOFOL;  Surgeon: Regis Bill, MD;  Location: [facility] ENDOSCOPY;  Service: Endoscopy;  Laterality: N/A;   FOOT SURGERY Left  VAGINAL HYSTERECTOMY      Social History   Tobacco Use   Smoking status: Never   Smokeless tobacco: Never  Vaping Use   Vaping status: Never Used  Substance Use Topics   Alcohol use: No    Alcohol/week: 0.0 standard drinks of alcohol   Drug use: No     Medication list has been reviewed and updated.  Current Meds  Medication Sig   amLODipine (NORVASC) 5 MG tablet TAKE 1 & 1/2 (ONE & ONE-HALF) TABLETS BY MOUTH ONCE DAILY   Ascorbic Acid (VITA-C PO) Take by mouth.   BIOTIN PO Take by mouth.    Cyanocobalamin (VITAMIN B-12 PO) Take by mouth.   ibuprofen (ADVIL) 600 MG tablet Take 1 tablet (600 mg total) by mouth every 6 (six) hours as needed.   linaclotide (LINZESS) 72 MCG capsule Take by mouth.   meclizine (ANTIVERT) 25 MG tablet Take 1 tablet (25 mg total) by mouth as needed for dizziness.   meloxicam (MOBIC) 7.5 MG tablet Take 1 tablet by mouth once daily   Multiple Vitamin (MULTIVITAMIN) tablet Take 1 tablet by mouth daily.   pravastatin (PRAVACHOL) 20 MG tablet Take 1 tablet by mouth once daily   valACYclovir (VALTREX) 500 MG tablet Take 1 tablet (500 mg total) by mouth daily.   VITAMIN D PO Take by mouth.   VITAMIN E PO Take by mouth.       03/17/2024    3:06 PM 11/24/2023    8:31 AM 07/02/2023   10:26 AM 06/15/2023   10:49 AM  GAD 7 : Generalized Anxiety Score  Nervous, Anxious, on Edge 0 0 0 0  Control/stop worrying 1 0 0 0  Worry too much - different things 1 0 0 0  Trouble relaxing 0 0 0 0  Restless 0 0 0 0  Easily annoyed or irritable 1 0 0 0  Afraid - awful might happen 0 0 0 0  Total GAD 7 Score 3 0 0 0  Anxiety Difficulty  Not difficult at all Not difficult at all Not difficult at all       03/17/2024    3:06 PM 11/24/2023    8:30 AM 07/22/2023    1:18 PM  Depression screen PHQ 2/9  Decreased Interest 0 0 0  Down, Depressed, Hopeless 0 0 0  PHQ - 2 Score 0 0 0  Altered sleeping  0   Tired, decreased energy  0   Change in appetite  0   Feeling bad or failure about yourself   0   Trouble concentrating  0   Moving slowly or fidgety/restless  0   Suicidal thoughts  0   PHQ-9 Score  0   Difficult doing work/chores  Not difficult at all     BP Readings from Last 3 Encounters:  03/17/24 138/84  11/24/23 138/78  08/10/23 (!) 146/89    Physical Exam Vitals and nursing note reviewed.  Constitutional:      General: She is not in acute distress.    Appearance: She is not diaphoretic.  HENT:     Head: Normocephalic and atraumatic.     Right Ear:  External ear normal.     Left Ear: External ear normal.     Nose: Nose normal.  Eyes:     General:        Right eye: No discharge.        Left eye: No discharge.     Conjunctiva/sclera: Conjunctivae normal.  Pupils: Pupils are equal, round, and reactive to light.  Neck:     Thyroid: No thyromegaly.     Vascular: No JVD.  Cardiovascular:     Rate and Rhythm: Normal rate and regular rhythm.     Heart sounds: Normal heart sounds. No murmur heard.    No friction rub. No gallop.  Pulmonary:     Effort: Pulmonary effort is normal.     Breath sounds: Normal breath sounds.  Abdominal:     General: Bowel sounds are normal.     Palpations: Abdomen is soft. There is no mass.     Tenderness: There is no abdominal tenderness. There is no guarding.  Musculoskeletal:        General: Normal range of motion.     Cervical back: Normal range of motion and neck supple.     Right knee: Swelling present. No erythema. Normal range of motion. Tenderness present. No medial joint line, lateral joint line, MCL, LCL or patellar tendon tenderness.     Comments: Palpable tender distal medial femur/popliteal bursa  Lymphadenopathy:     Cervical: No cervical adenopathy.  Skin:    General: Skin is warm and dry.  Neurological:     Mental Status: She is alert.     Deep Tendon Reflexes: Reflexes are normal and symmetric.     Wt Readings from Last 3 Encounters:  03/17/24 155 lb (70.3 kg)  11/24/23 149 lb (67.6 kg)  08/10/23 148 lb (67.1 kg)    BP 138/84   Pulse 85   Resp 16   Ht 5\' 4"  (1.626 m)   Wt 155 lb (70.3 kg)   SpO2 96%   BMI 26.61 kg/m   Assessment and Plan: 1. Popliteal bursitis of right knee (Primary) New onset.  Persistent.  Waxes and wanes in particular it only occurs when patient has increased activity upright particularly in her working situation.  Patient complains of pain of the distal femur medial side of the right knee as well as popliteal discomfort as well there is tenderness  of the popliteal with mild tenderness over the possible Pez anserine bursa.  Patient has taken meloxicam 7.5 mg in the past but is currently not taking I have asked her to start taking naproxen or ibuprofen may take Tylenol along with this do not put ice behind the knee anymore and we will obtain an x-ray of the knee to see the level of osteoarthritis and arrange for Dr. Ashley Royalty to evaluate the need to see if further evaluation therapeutics is going to be necessary in the future. - DG Knee Complete 4 Views Right; Future     Elizabeth Sauer, MD

## 2024-03-18 ENCOUNTER — Encounter: Payer: Self-pay | Admitting: Family Medicine

## 2024-03-18 ENCOUNTER — Ambulatory Visit (INDEPENDENT_AMBULATORY_CARE_PROVIDER_SITE_OTHER): Admitting: Family Medicine

## 2024-03-18 VITALS — BP 130/86 | HR 90 | Ht 64.0 in | Wt 154.0 lb

## 2024-03-18 DIAGNOSIS — M1711 Unilateral primary osteoarthritis, right knee: Secondary | ICD-10-CM | POA: Insufficient documentation

## 2024-03-18 MED ORDER — DICLOFENAC SODIUM 50 MG PO TBEC
50.0000 mg | DELAYED_RELEASE_TABLET | Freq: Two times a day (BID) | ORAL | 0 refills | Status: DC
Start: 2024-03-18 — End: 2024-04-12

## 2024-03-18 NOTE — Telephone Encounter (Signed)
 Med refill

## 2024-03-18 NOTE — Patient Instructions (Signed)
 Patient Care Plan  1. Medication:    - Take diclofenac 75 mg twice daily. Do not use other NSAIDs.  2. Support and Exercise:    - Use a patellar stabilizing brace during work and as needed.    - Perform exercises provided to strengthen hip and knee muscles.  3. Monitoring and Follow-Up:    - Continue taking medication for at least one week, then use as needed.    - Contact us via phone or MyChart in 2-4 weeks if symptoms do not improve or if they worsen.  Red Flags  - Report if symptoms do not improve in 2-4 weeks or if they worsen.

## 2024-03-18 NOTE — Assessment & Plan Note (Addendum)
 History of Present Illness Tristen Pennino is a 68 year old female who presents with right knee pain and swelling. She was referred by her PCP Dr. Yetta Barre for evaluation of her knee pain.  She presents with right knee pain and swelling that began two weeks ago after she stopped working. The pain is described as a dull, throbbing sensation that worsens with bending, making it difficult to get in and out of the bathtub. It is primarily located behind the right knee and on the inside part of the knee. Swelling is noted behind the knee and on the inside aspect, larger compared to the other knee. Occasionally, the pain radiates down to her feet, and she experiences weakness in her legs, requiring support when getting up. No similar symptoms have been experienced in the past.  She has a long history of working on her feet, previously at [employer] and now at a new job. The pain worsens throughout the day, especially after prolonged walking or standing, as experienced during her first day at the new job. She had to leave work early due to the severity of the pain.  For symptom relief, she has used a frozen bottle under the knee, which provided temporary relief. A topical OTC cream, she is uncertain which, was applied but did not help. She took 400 mg of ibuprofen last night, which did not significantly change her morning pain levels, as she sometimes wakes up with less pain regardless of medication use.  No history of blood clots and no calf symptoms.  Physical Exam INSPECTION: Right knee inspection normal, no swelling, erythema, ecchymosis, atrophy, deformity, or skin changes. PALPATION: No effusion on right knee palpation. Lateral patellar facet non-tender. Mild tenderness at medial patella facet. Quadriceps and patellar tendons non-tender. Lateral and medial joint lines non-tender. Pes anserine bursa non-tender. Popliteal fossa and medial hamstring tendons non-tender. Right calf non-tender. RANGE OF  MOTION: Full active and passive ROM 0-135 degrees painless. STRENGTH: Resisted hip flexion and straight leg raise benign. SPECIAL TESTS: Negative anterior and posterior drawer tests. Negative McMurray's test. Negative valgus and varus stress tests. Negative Lachman test. Negative Homan's sign. Right single leg squat elicits discomfort with mild increased valgus.  Results RADIOLOGY Right knee X-ray: Lateral compartment: normal, Medial compartment: mild osteoarthritis, Patellofemoral compartment: mild osteoarthritis with small osteophytes (03/17/2024)  Assessment and Plan Right knee Osteoarthritis Mild to moderate osteoarthritis in the right knee, primarily affecting patellofemoral and medial compartments. Recent flare-up due to increased activity. - Prescribed diclofenac 75 mg twice daily. Avoid other NSAIDs. - Advised use of patellar stabilizing brace during work and as needed. - Provided exercises to strengthen hip and knee muscles. - Instructed to continue medication for at least one week, then use as needed. - Advised to report if symptoms do not improve in 2-4 weeks or worsen. - Discussed osteoarthritis management, emphasizing exercise and bracing.  Follow-up Plan to monitor progress and adjust treatment as necessary. MyChart access needs restoration for communication. - Instructed to contact via phone or MyChart in 2-4 weeks if symptoms do not improve or worsen.

## 2024-03-18 NOTE — Progress Notes (Signed)
 Primary Care / Sports Medicine Office Visit  Patient Information:  Patient ID: Cheyenne Bean, female DOB: 1956-05-13 Age: 68 y.o. MRN: [id number]   Cheyenne Bean is a pleasant 68 y.o. female presenting with the following:  Chief Complaint  Patient presents with   Knee Pain    Right knee pain x 2 weeks. Aggravating factors bending, lifting. Patient has a dull throbbing ache, this is not constant. Alleviating factors are ibuprofen. Xray's obtained yesterday. NKI. No PT. No injection therapy.     Vitals:   03/18/24 1015  BP: 130/86  Pulse: 90  SpO2: 96%   Vitals:   03/18/24 1015  Weight: 154 lb (69.9 kg)  Height: 5\' 4"  (1.626 m)   Body mass index is 26.43 kg/m.  No results found.   Independent interpretation of notes and tests performed by another provider:   See below  Procedures performed:   None  Pertinent History, Exam, Impression, and Recommendations:   Problem List Items Addressed This Visit     Osteoarthritis of right knee - Primary   History of Present Illness Cheyenne Bean is a 68 year old female who presents with right knee pain and swelling. She was referred by her PCP Dr. Yetta Barre for evaluation of her knee pain.  She presents with right knee pain and swelling that began two weeks ago after she stopped working. The pain is described as a dull, throbbing sensation that worsens with bending, making it difficult to get in and out of the bathtub. It is primarily located behind the right knee and on the inside part of the knee. Swelling is noted behind the knee and on the inside aspect, larger compared to the other knee. Occasionally, the pain radiates down to her feet, and she experiences weakness in her legs, requiring support when getting up. No similar symptoms have been experienced in the past.  She has a long history of working on her feet, previously at [employer] and now at a new job. The pain worsens throughout the day, especially after  prolonged walking or standing, as experienced during her first day at the new job. She had to leave work early due to the severity of the pain.  For symptom relief, she has used a frozen bottle under the knee, which provided temporary relief. A topical OTC cream, she is uncertain which, was applied but did not help. She took 400 mg of ibuprofen last night, which did not significantly change her morning pain levels, as she sometimes wakes up with less pain regardless of medication use.  No history of blood clots and no calf symptoms.  Physical Exam INSPECTION: Right knee inspection normal, no swelling, erythema, ecchymosis, atrophy, deformity, or skin changes. PALPATION: No effusion on right knee palpation. Lateral patellar facet non-tender. Mild tenderness at medial patella facet. Quadriceps and patellar tendons non-tender. Lateral and medial joint lines non-tender. Pes anserine bursa non-tender. Popliteal fossa and medial hamstring tendons non-tender. Right calf non-tender. RANGE OF MOTION: Full active and passive ROM 0-135 degrees painless. STRENGTH: Resisted hip flexion and straight leg raise benign. SPECIAL TESTS: Negative anterior and posterior drawer tests. Negative McMurray's test. Negative valgus and varus stress tests. Negative Lachman test. Negative Homan's sign. Right single leg squat elicits discomfort with mild increased valgus.  Results RADIOLOGY Right knee X-ray: Lateral compartment: normal, Medial compartment: mild osteoarthritis, Patellofemoral compartment: mild osteoarthritis with small osteophytes (03/17/2024)  Assessment and Plan Right knee Osteoarthritis Mild to moderate osteoarthritis in the right knee,  primarily affecting patellofemoral and medial compartments. Recent flare-up due to increased activity. - Prescribed diclofenac 75 mg twice daily. Avoid other NSAIDs. - Advised use of patellar stabilizing brace during work and as needed. - Provided exercises to strengthen  hip and knee muscles. - Instructed to continue medication for at least one week, then use as needed. - Advised to report if symptoms do not improve in 2-4 weeks or worsen. - Discussed osteoarthritis management, emphasizing exercise and bracing.  Follow-up Plan to monitor progress and adjust treatment as necessary. MyChart access needs restoration for communication. - Instructed to contact via phone or MyChart in 2-4 weeks if symptoms do not improve or worsen.      Relevant Medications   diclofenac (VOLTAREN) 50 MG EC tablet     Orders & Medications Medications:  Meds ordered this encounter  Medications   diclofenac (VOLTAREN) 50 MG EC tablet    Sig: Take 1 tablet (50 mg total) by mouth 2 (two) times daily. X 1-2 weeks then twice daily PRN    Dispense:  60 tablet    Refill:  0   No orders of the defined types were placed in this encounter.    No follow-ups on file.     Jerrol Banana, MD, [facility]   Primary Care Sports Medicine Primary Care and Sports Medicine at [facility]

## 2024-03-18 NOTE — Telephone Encounter (Signed)
 Requested medication (s) are due for refill today - yes  Requested medication (s) are on the active medication list -yes  Future visit scheduled -no  Last refill: 01/19/24 #90 (60 day supply)  Notes to clinic: Patient is office yesterday- OK with refilling based on that BP?  Requested Prescriptions  Pending Prescriptions Disp Refills   amLODipine (NORVASC) 5 MG tablet [Pharmacy Med Name: amLODIPine Besylate 5 MG Oral Tablet] 90 tablet 0    Sig: TAKE 1 & 1/2 (ONE & ONE-HALF) TABLETS BY MOUTH ONCE DAILY. WILL NEED BP RECHECK IN THE NEXT 2 MONTHS     Cardiovascular: Calcium Channel Blockers 2 Failed - 03/18/2024  1:40 PM      Failed - Valid encounter within last 6 months    Recent Outpatient Visits           Today Osteoarthritis of right knee, unspecified osteoarthritis type   [place] [facility] at [facility], Ocie Bob, MD   Yesterday Popliteal bursitis of right knee   [facility] at [facility], MD              Passed - Last BP in normal range    BP Readings from Last 1 Encounters:  03/18/24 130/86         Passed - Last Heart Rate in normal range    Pulse Readings from Last 1 Encounters:  03/18/24 90            Requested Prescriptions  Pending Prescriptions Disp Refills   amLODipine (NORVASC) 5 MG tablet [Pharmacy Med Name: amLODIPine Besylate 5 MG Oral Tablet] 90 tablet 0    Sig: TAKE 1 & 1/2 (ONE & ONE-HALF) TABLETS BY MOUTH ONCE DAILY. WILL NEED BP RECHECK IN THE NEXT 2 MONTHS     Cardiovascular: Calcium Channel Blockers 2 Failed - 03/18/2024  1:40 PM      Failed - Valid encounter within last 6 months    Recent Outpatient Visits           Today Osteoarthritis of right knee, unspecified osteoarthritis type   [place] [facility] at [facility], Ocie Bob, MD   Yesterday Popliteal bursitis of right knee   [facility] at [facility], [place]              Passed - Last BP in normal range    BP Readings from Last 1 Encounters:  03/18/24 130/86         Passed - Last Heart Rate in normal range    Pulse Readings from Last 1 Encounters:  03/18/24 90

## 2024-03-30 ENCOUNTER — Other Ambulatory Visit: Payer: Self-pay | Admitting: Family Medicine

## 2024-03-30 DIAGNOSIS — I10 Essential (primary) hypertension: Secondary | ICD-10-CM

## 2024-04-02 ENCOUNTER — Other Ambulatory Visit: Payer: Self-pay | Admitting: Family Medicine

## 2024-04-02 DIAGNOSIS — E7800 Pure hypercholesterolemia, unspecified: Secondary | ICD-10-CM

## 2024-04-07 ENCOUNTER — Telehealth: Payer: Self-pay | Admitting: Family Medicine

## 2024-04-07 NOTE — Telephone Encounter (Signed)
 Copied from CRM [id number]. Topic: General - Other >> Apr 07, 2024  9:52 AM [name] wrote: Reason for CRM: Patient saw Dr.Matthews on 3/28 for right knee pain. Patient stands on her feet at work for 7.5 hours and she wants to know if  Dr. Augustus Ledger can write her her note saying she cannot be on feet all day  . Patient can be contacted by phone at [phone]

## 2024-04-07 NOTE — Telephone Encounter (Signed)
 Please review and advise.   JM

## 2024-04-08 NOTE — Telephone Encounter (Signed)
 I have sent her a letter thorough MYCHART. Spoke with patient and informed her.

## 2024-04-12 ENCOUNTER — Encounter: Payer: Self-pay | Admitting: Family Medicine

## 2024-04-12 ENCOUNTER — Ambulatory Visit: Payer: Self-pay

## 2024-04-12 ENCOUNTER — Other Ambulatory Visit (INDEPENDENT_AMBULATORY_CARE_PROVIDER_SITE_OTHER): Payer: Self-pay | Admitting: Radiology

## 2024-04-12 ENCOUNTER — Ambulatory Visit (INDEPENDENT_AMBULATORY_CARE_PROVIDER_SITE_OTHER): Admitting: Family Medicine

## 2024-04-12 VITALS — BP 140/90 | HR 85 | Ht 64.0 in | Wt 154.0 lb

## 2024-04-12 DIAGNOSIS — M1711 Unilateral primary osteoarthritis, right knee: Secondary | ICD-10-CM | POA: Diagnosis not present

## 2024-04-12 MED ORDER — DICLOFENAC SODIUM 50 MG PO TBEC
50.0000 mg | DELAYED_RELEASE_TABLET | Freq: Two times a day (BID) | ORAL | 0 refills | Status: AC | PRN
Start: 2024-04-12 — End: 2024-05-12

## 2024-04-12 NOTE — Progress Notes (Signed)
 Primary Care / Sports Medicine Office Visit  Patient Information:  Patient ID: Cheyenne Bean, female DOB: 12/09/1956 Age: 68 y.o. MRN: [id number]   Cheyenne Bean is a pleasant 68 y.o. female presenting with the following:  Chief Complaint  Patient presents with   Knee Pain    Patient continues to have knee pain despite taking diclofenac  BID daily and she has been wearing her knee brace and doing her home exercises.     Vitals:   04/12/24 1442  BP: (!) 140/90  Pulse: 85  SpO2: 94%   Vitals:   04/12/24 1442  Weight: 154 lb (69.9 kg)  Height: 5\' 4"  (1.626 m)   Body mass index is 26.43 kg/m.  DG Knee Complete 4 Views Right Result Date: 03/22/2024 CLINICAL DATA:  Right knee pain EXAM: RIGHT KNEE - COMPLETE 4+ VIEW COMPARISON:  None Available. FINDINGS: No evidence of fracture, dislocation, or joint effusion. No evidence of arthropathy or other focal bone abnormality. Soft tissues are unremarkable. IMPRESSION: Negative. Electronically Signed   By: Fredrich Jefferson M.D.   On: 03/22/2024 09:01     Independent interpretation of notes and tests performed by another provider:   None  Procedures performed:   Procedure:  Injection of right knee under ultrasound guidance. Ultrasound guidance utilized for anteromedial approach, no sonographic abnormalities noted Samsung HS60 device utilized with permanent recording / reporting. Verbal informed consent obtained and verified. Skin prepped in a sterile fashion. Ethyl chloride for topical local analgesia.  Completed without difficulty and tolerated well. Medication: triamcinolone  acetonide 40 mg/mL suspension for injection 1 mL total and 2 mL lidocaine  1% without epinephrine utilized for needle placement anesthetic Advised to contact for fevers/chills, erythema, induration, drainage, or persistent bleeding.   Pertinent History, Exam, Impression, and Recommendations:   Problem List Items Addressed This Visit      Osteoarthritis of right knee - Primary   History of Present Illness Cheyenne Bean is a 68 year old female who presents with persistent right knee pain in the setting of osteoarthritis.  She has been experiencing persistent knee pain for a month, which has not improved despite diclofenac  twice daily. The pain worsens with prolonged activity, such as an eight-hour work shift, which is longer than her usual four to five hours. The pain is located in the knee, radiating to the calf and down the leg, and is exacerbated by walking and driving.  The previously prescribed diclofenac  was helping her tolerate the pain but did not afford gradual improvement.  She works at [employer] in [place] and lives nearby, which requires her to drive, further aggravating her knee pain. The pain has been severe enough to affect her ability to concentrate while driving.  She is currently taking diclofenac  twice a day as needed for pain management. She has been performing exercises provided to her, but notes that sometimes these exercises exacerbate the knee pain.  Assessment and Plan Knee osteoarthritis exacerbation  Acute on chronic knee pain with inflammation, severe and unresponsive to current oral diclofenac  regimen. Cortisone injection proposed for pain relief. - Administer cortisone injection to the knee using ultrasound guidance. - Advise ice application on the knee tonight and before bedtime. - Recommend relative rest for two days, avoiding strenuous activity. - Advise use of knee brace during waking hours for at least two days then as needed during increased/strenuous activity. - Prescribe diclofenac  twice daily as needed, starting tonight. - Advise resumption of knee exercises once pain is controlled.  Relevant Medications   diclofenac  (VOLTAREN ) 50 MG EC tablet   Other Relevant Orders   US  LIMITED JOINT SPACE STRUCTURES LOW RIGHT     Orders & Medications Medications:  Meds  ordered this encounter  Medications   diclofenac  (VOLTAREN ) 50 MG EC tablet    Sig: Take 1 tablet (50 mg total) by mouth 2 (two) times daily as needed.    Dispense:  60 tablet    Refill:  0   Orders Placed This Encounter  Procedures   US  LIMITED JOINT SPACE STRUCTURES LOW RIGHT     No follow-ups on file.     Ma Saupe, MD, [facility]   Primary Care Sports Medicine Primary Care and Sports Medicine at [facility]

## 2024-04-12 NOTE — Patient Instructions (Signed)
 Patient Plan for Post-Visit Guidance  1. Knee Osteoarthritis Management:    - Receive a cortisone injection in the knee as planned.    - Apply ice to your knee tonight and before bedtime.    - Rest for two days, avoiding any strenuous activities.    - Wear a knee brace during waking hours for at least two days, and then as needed during high activity.    - Take diclofenac  twice daily as needed, starting tonight.    - Resume knee exercises once the pain is under control.  Red Flags: - If you experience increased swelling, severe pain, or any unusual symptoms, contact your healthcare provider immediately.  Please follow these steps and reach out if you have questions or need further assistance.

## 2024-04-12 NOTE — Assessment & Plan Note (Signed)
 History of Present Illness Cheyenne Bean is a 68 year old female who presents with persistent right knee pain in the setting of osteoarthritis.  She has been experiencing persistent knee pain for a month, which has not improved despite diclofenac  twice daily. The pain worsens with prolonged activity, such as an eight-hour work shift, which is longer than her usual four to five hours. The pain is located in the knee, radiating to the calf and down the leg, and is exacerbated by walking and driving.  The previously prescribed diclofenac  was helping her tolerate the pain but did not afford gradual improvement.  She works at [employer] in [place] and lives nearby, which requires her to drive, further aggravating her knee pain. The pain has been severe enough to affect her ability to concentrate while driving.  She is currently taking diclofenac  twice a day as needed for pain management. She has been performing exercises provided to her, but notes that sometimes these exercises exacerbate the knee pain.  Assessment and Plan Knee osteoarthritis exacerbation  Acute on chronic knee pain with inflammation, severe and unresponsive to current oral diclofenac  regimen. Cortisone injection proposed for pain relief. - Administer cortisone injection to the knee using ultrasound guidance. - Advise ice application on the knee tonight and before bedtime. - Recommend relative rest for two days, avoiding strenuous activity. - Advise use of knee brace during waking hours for at least two days then as needed during increased/strenuous activity. - Prescribe diclofenac  twice daily as needed, starting tonight. - Advise resumption of knee exercises once pain is controlled.

## 2024-04-12 NOTE — Telephone Encounter (Signed)
 Copied from CRM [id number]. Topic: Clinical - Red Word Triage >> Apr 12, 2024  1:49 PM [name] wrote: Red Word that prompted transfer to Nurse Triage: Patient is having pain in her knee all the way to her calf in her right leg, she is stating that she had an appointment on 03/28 and it has been getting worse since then.   Chief Complaint: Knee pain Symptoms: Right knee pain, right calf pain Frequency: Constant  Pertinent Negatives: Patient denies chest pain, shortness of breath, warmth to right calf  Disposition: [] ED /[] Urgent Care (no appt availability in office) / [x] Appointment(In office/virtual)/ []  [facility]/ [] Home Care/ [] Refused Recommended Disposition /[] [facility]/ []  Follow-up with PCP Additional Notes: Patient states she was seen last month for right knee pain and would like to be seen again if possible due to her pain worsening. She states her pain is 7/10 in her right knee, which she states is also slightly swollen. She states she is also experiencing some pain in her right calf. She denies any chest pain, shortness of breath, or warmth to her right calf. Appointment made for the patient today for evaluation of her knee.     Reason for Disposition  [1] Thigh or calf pain AND [2] only 1 side AND [3] present > 1 hour  Answer Assessment - Initial Assessment Questions 1. LOCATION and RADIATION: "Where is the pain located?"      Right knee pain radiating to calf 2. QUALITY: "What does the pain feel like?"  (e.g., sharp, dull, aching, burning)     Sharp 3. SEVERITY: "How bad is the pain?" "What does it keep you from doing?"   (Scale 1-10; or mild, moderate, severe)   -  MILD (1-3): doesn't interfere with normal activities    -  MODERATE (4-7): interferes with normal activities (e.g., work or school) or awakens from sleep, limping    -  SEVERE (8-10): excruciating pain, unable to do any normal activities, unable to walk     7/10 4. ONSET: "When did the pain  start?" "Does it come and go, or is it there all the time?"     2-3 months, constant  5. RECURRENT: "Have you had this pain before?" If Yes, ask: "When, and what happened then?"     Yes, history of arthritis  6. SETTING: "Has there been any recent work, exercise or other activity that involved that part of the body?"      Works on her feet 7. AGGRAVATING FACTORS: "What makes the knee pain worse?" (e.g., walking, climbing stairs, running)     Walking and bending knee  8. ASSOCIATED SYMPTOMS: "Is there any swelling or redness of the knee?"     Slight swelling  9. OTHER SYMPTOMS: "Do you have any other symptoms?" (e.g., chest pain, difficulty breathing, fever, calf pain)     Calf pain  Protocols used: Knee Pain-A-AH

## 2024-04-12 NOTE — Telephone Encounter (Signed)
 Patient having knee pain that radiates into calf.

## 2024-04-13 ENCOUNTER — Telehealth: Payer: Self-pay

## 2024-04-13 ENCOUNTER — Ambulatory Visit: Payer: Medicare Other | Admitting: Emergency Medicine

## 2024-04-13 VITALS — Ht 65.0 in | Wt 152.0 lb

## 2024-04-13 DIAGNOSIS — Z780 Asymptomatic menopausal state: Secondary | ICD-10-CM

## 2024-04-13 DIAGNOSIS — Z0000 Encounter for general adult medical examination without abnormal findings: Secondary | ICD-10-CM

## 2024-04-13 NOTE — Patient Instructions (Addendum)
 Cheyenne Bean , Thank you for taking time to come for your Medicare Wellness Visit. I appreciate your ongoing commitment to your health goals. Please review the following plan we discussed and let me know if I can assist you in the future.   Referrals/Orders/Follow-Ups/Clinician Recommendations:   Get a tetanus vaccine at your local pharmacy.  You are due for another pneumonia vaccine. You can get this at your next OV or at the pharmacy.  I have placed an order for a bone density test. Call [facility] @ [phone] to schedule at your convenience.  This is a list of the screening recommended for you and due dates:  Health Maintenance  Topic Date Due   Zoster (Shingles) Vaccine (1 of 2) Never done   Pneumonia Vaccine (2 of 2 - PCV) 04/17/2011   COVID-19 Vaccine (6 - 2024-25 season) 08/23/2023   DEXA scan (bone density measurement)  Never done   Flu Shot  07/22/2024   Mammogram  02/14/2025   Medicare Annual Wellness Visit  04/13/2025   Pap with HPV screening  07/21/2028   Colon Cancer Screening  03/29/2030   HPV Vaccine  Aged Out   Meningitis B Vaccine  Aged Out   DTaP/Tdap/Td vaccine  Discontinued   Hepatitis C Screening  Discontinued   HIV Screening  Discontinued    Advanced directives: (ACP Link)Information on Advanced Care Planning can be found at [place]  Secretary of [facility] Advance Health Care Directives Advance Health Care Directives. [email or web address] You may also get these forms at your doctor's office.  Once you have completed the forms, please bring a copy of your health care power of attorney and living will to the office to be added to your chart at your convenience.   Next Medicare Annual Wellness Visit scheduled for next year: Yes, 04/19/25 @ 8:10am (phone visit)

## 2024-04-13 NOTE — Telephone Encounter (Signed)
 Pt was seen by Dr. Augustus Ledger yesterday.  KP

## 2024-04-13 NOTE — Telephone Encounter (Signed)
 Please call pt to schedule and appt with Dr. Augustus Ledger. For knee pain.  KP

## 2024-04-13 NOTE — Progress Notes (Signed)
 Subjective:   Cheyenne Bean is a 68 y.o. who presents for a Medicare Wellness preventive visit.  Visit Complete: Virtual I connected with  Cheyenne Bean on 04/13/24 by a audio enabled telemedicine application and verified that I am speaking with the correct person using two identifiers.  Patient Location: Home  Provider Location: Home Office  I discussed the limitations of evaluation and management by telemedicine. The patient expressed understanding and agreed to proceed.  Vital Signs: Because this visit was a virtual/telehealth visit, some criteria may be missing or patient reported. Any vitals not documented were not able to be obtained and vitals that have been documented are patient reported.  VideoDeclined- This patient declined Librarian, academic. Therefore the visit was completed with audio only.  Persons Participating in Visit: Patient.  AWV Questionnaire: No: Patient Medicare AWV questionnaire was not completed prior to this visit.  Cardiac Risk Factors include: hypertension;dyslipidemia     Objective:    Today's Vitals   04/13/24 0819  Weight: 152 lb (68.9 kg)  Height: 5\' 5"  (1.651 m)   Body mass index is 25.29 kg/m.     04/13/2024    8:28 AM 04/09/2023    8:18 AM 03/30/2023   11:40 AM 09/29/2022    3:56 PM 02/02/2022    9:17 AM 04/30/2021    8:46 AM 04/27/2021    2:01 PM  Advanced Directives  Does Patient Have a Medical Advance Directive? No No No No No No No  Would patient like information on creating a medical advance directive? Yes (MAU/Ambulatory/Procedural Areas - Information given) No - Patient declined No - Patient declined  No - Patient declined No - Patient declined No - Patient declined    Current Medications (verified) Outpatient Encounter Medications as of 04/13/2024  Medication Sig   amLODipine  (NORVASC ) 5 MG tablet TAKE 1 & 1/2 (ONE & ONE-HALF) TABLETS BY MOUTH ONCE DAILY WILL  NEED  TO  RECHECK  BP  IN   THE  NEXT  2  MONTHS   Ascorbic Acid (VITA-C PO) Take by mouth.   BIOTIN PO Take by mouth.   Cyanocobalamin (VITAMIN B-12 PO) Take by mouth.   diclofenac  (VOLTAREN ) 50 MG EC tablet Take 1 tablet (50 mg total) by mouth 2 (two) times daily as needed.   ibuprofen  (ADVIL ) 600 MG tablet Take 1 tablet (600 mg total) by mouth every 6 (six) hours as needed.   linaclotide (LINZESS) 72 MCG capsule Take by mouth.   meclizine  (ANTIVERT ) 25 MG tablet Take 1 tablet (25 mg total) by mouth as needed for dizziness.   Multiple Vitamin (MULTIVITAMIN) tablet Take 1 tablet by mouth daily.   nystatin -triamcinolone  ointment (MYCOLOG) Apply 1 Application topically 2 (two) times daily as needed.   pravastatin  (PRAVACHOL ) 20 MG tablet Take 1 tablet by mouth once daily   valACYclovir  (VALTREX ) 500 MG tablet Take 1 tablet (500 mg total) by mouth daily.   VITAMIN D PO Take by mouth.   VITAMIN E PO Take by mouth.   No facility-administered encounter medications on file as of 04/13/2024.    Allergies (verified) Patient has no known allergies.   History: Past Medical History:  Diagnosis Date   BRCA negative 01/2017   MyRisk neg   Family history of breast cancer    1/18 IBIS=10.7%   Family history of ovarian cancer    MyRisk neg   Genital herpes    Hyperlipidemia    Hypertension    IBS (irritable  bowel syndrome) 02/20/2017   Sarcoidosis    Past Surgical History:  Procedure Laterality Date   BACK SURGERY     BUNIONECTOMY Left    COLONOSCOPY  2012   cleared for 10 yrs   COLONOSCOPY WITH PROPOFOL  N/A 02/27/2017   Procedure: COLONOSCOPY WITH PROPOFOL ;  Surgeon: Deveron Fly, MD;  Location: [facility] ENDOSCOPY;  Service: Endoscopy;  Laterality: N/A;   COLONOSCOPY WITH PROPOFOL  N/A 03/30/2023   Procedure: COLONOSCOPY WITH PROPOFOL ;  Surgeon: Shane Darling, MD;  Location: [facility] ENDOSCOPY;  Service: Endoscopy;  Laterality: N/A;   FOOT SURGERY Left    VAGINAL HYSTERECTOMY     Family History  Problem  Relation Age of Onset   Hypertension Mother    Hypertension Father    Breast cancer Sister 71   Cervical cancer Sister 83   Ovarian cancer Maternal Aunt 84   Colon cancer Maternal Uncle 29   Social History   Socioeconomic History   Marital status: Married    Spouse name: Forestine Igo   Number of children: 0   Years of education: Not on file   Highest education level: Not on file  Occupational History   Not on file  Tobacco Use   Smoking status: Never   Smokeless tobacco: Never  Vaping Use   Vaping status: Never Used  Substance and Sexual Activity   Alcohol use: No    Alcohol/week: 0.0 standard drinks of alcohol   Drug use: No   Sexual activity: Yes  Other Topics Concern   Not on file  Social History Narrative   Not on file   Social Drivers of Health   Financial Resource Strain: Low Risk  (04/13/2024)   Overall Financial Resource Strain (CARDIA)    Difficulty of Paying Living Expenses: Not hard at all  Food Insecurity: No Food Insecurity (04/13/2024)   Hunger Vital Sign    Worried About Running Out of Food in the Last Year: Never true    Ran Out of Food in the Last Year: Never true  Transportation Needs: No Transportation Needs (04/13/2024)   PRAPARE - Administrator, Civil Service (Medical): No    Lack of Transportation (Non-Medical): No  Physical Activity: Inactive (04/13/2024)   Exercise Vital Sign    Days of Exercise per Week: 0 days    Minutes of Exercise per Session: 0 min  Stress: No Stress Concern Present (04/13/2024)   [employer] of Occupational Health - Occupational Stress Questionnaire    Feeling of Stress : Only a little  Social Connections: Moderately Isolated (04/13/2024)   Social Connection and Isolation Panel [NHANES]    Frequency of Communication with Friends and Family: More than three times a week    Frequency of Social Gatherings with Friends and Family: More than three times a week    Attends Religious Services: Never    Loss adjuster, chartered or Organizations: No    Attends Engineer, structural: Never    Marital Status: Married    Tobacco Counseling Counseling given: Not Answered    Clinical Intake:  Pre-visit preparation completed: Yes  Pain : No/denies pain     BMI - recorded: 25.29 Nutritional Status: BMI 25 -29 Overweight Nutritional Risks: None Diabetes: No  No results found for: "HGBA1C"   How often do you need to have someone help you when you read instructions, pamphlets, or other written materials from your doctor or pharmacy?: 1 - Never  Interpreter Needed?: No  Information entered by ::  Jaunita Messier, CMA   Activities of Daily Living     04/13/2024    8:20 AM  In your present state of health, do you have any difficulty performing the following activities:  Hearing? 0  Vision? 0  Difficulty concentrating or making decisions? 0  Walking or climbing stairs? 0  Dressing or bathing? 0  Doing errands, shopping? 0  Preparing Food and eating ? N  Using the Toilet? N  In the past six months, have you accidently leaked urine? N  Do you have problems with loss of bowel control? N  Managing your Medications? N  Managing your Finances? N  Housekeeping or managing your Housekeeping? N    Patient Care Team: Clarise Crooks, MD as PCP - General (Family Medicine) Annell Kidney, MD as Referring Physician (Ophthalmology) Ma Saupe, MD as Consulting Physician (Sports Medicine) Teresa Fender, MD as Referring Physician (Obstetrics and Gynecology)  Indicate any recent Medical Services you may have received from other than [facility] providers in the past year (date may be approximate).     Assessment:   This is a routine wellness examination for [place].  Hearing/Vision screen Hearing Screening - Comments:: Denies hearing loss Vision Screening - Comments:: Gets routine eye exams, [facility], [place]   Goals Addressed             This Visit's Progress    Patient  Stated       Exercise more, lose weight and eat healthier       Depression Screen     04/13/2024    8:26 AM 03/17/2024    3:06 PM 11/24/2023    8:30 AM 07/22/2023    1:18 PM 07/02/2023   10:25 AM 06/15/2023   10:49 AM 04/09/2023    8:17 AM  PHQ 2/9 Scores  PHQ - 2 Score 0 0 0 0 0 0 0  PHQ- 9 Score 0  0  0 0 0    Fall Risk     04/13/2024    8:29 AM 03/17/2024    3:06 PM 11/24/2023    8:31 AM 07/22/2023    1:17 PM 07/02/2023   10:25 AM  Fall Risk   Falls in the past year? 0 0 0 0 0  Number falls in past yr: 0 0 0 0 0  Injury with Fall? 0 0 0 0 0  Risk for fall due to : No Fall Risks  No Fall Risks No Fall Risks No Fall Risks  Follow up Falls prevention discussed;Falls evaluation completed  Falls evaluation completed Falls evaluation completed Falls evaluation completed    MEDICARE RISK AT HOME:  Medicare Risk at Home Any stairs in or around the home?: Yes If so, are there any without handrails?: No Home free of loose throw rugs in walkways, pet beds, electrical cords, etc?: Yes Adequate lighting in your home to reduce risk of falls?: Yes Life alert?: No Use of a cane, walker or w/c?: No Grab bars in the bathroom?: Yes Shower chair or bench in shower?: No Elevated toilet seat or a handicapped toilet?: No  TIMED UP AND GO:  Was the test performed?  No  Cognitive Function: 6CIT completed        04/13/2024    8:30 AM 04/09/2023    8:23 AM  6CIT Screen  What Year? 0 points 0 points  What month? 0 points 0 points  What time? 0 points 0 points  Count back from 20 0 points 0 points  Months in reverse 0 points 0 points  Repeat phrase 2 points 2 points  Total Score 2 points 2 points    Immunizations Immunization History  Administered Date(s) Administered   Fluad Trivalent(High Dose 65+) 11/24/2023   Influenza,inj,Quad PF,6+ Mos 01/02/2017, 03/16/2018, 10/15/2018, 11/08/2019, 10/22/2020, 11/28/2020, 10/14/2021, 12/29/2022   Influenza-Unspecified 01/02/2017, 03/16/2018,  10/15/2018, 11/08/2019, 12/25/2022   PFIZER Comirnaty(Gray Top)Covid-19 Tri-Sucrose Vaccine 03/06/2020, 03/27/2020, 10/22/2020   PFIZER(Purple Top)SARS-COV-2 Vaccination 03/06/2020, 03/27/2020   Pneumococcal Polysaccharide-23 04/16/2010    Screening Tests Health Maintenance  Topic Date Due   Zoster Vaccines- Shingrix (1 of 2) Never done   Pneumonia Vaccine 84+ Years old (2 of 2 - PCV) 04/17/2011   COVID-19 Vaccine (6 - 2024-25 season) 08/23/2023   DEXA SCAN  Never done   INFLUENZA VACCINE  07/22/2024   MAMMOGRAM  02/14/2025   Medicare Annual Wellness (AWV)  04/13/2025   Cervical Cancer Screening (HPV/Pap Cotest)  07/21/2028   Colonoscopy  03/29/2030   HPV VACCINES  Aged Out   Meningococcal B Vaccine  Aged Out   DTaP/Tdap/Td  Discontinued   Hepatitis C Screening  Discontinued   HIV Screening  Discontinued    Health Maintenance  Health Maintenance Due  Topic Date Due   Zoster Vaccines- Shingrix (1 of 2) Never done   Pneumonia Vaccine 4+ Years old (2 of 2 - PCV) 04/17/2011   COVID-19 Vaccine (6 - 2024-25 season) 08/23/2023   DEXA SCAN  Never done   Health Maintenance Items Addressed: DEXA ordered, See Nurse Notes  Additional Screening:  Vision Screening: Recommended annual ophthalmology exams for early detection of glaucoma and other disorders of the eye.  Dental Screening: Recommended annual dental exams for proper oral hygiene  Community Resource Referral / Chronic Care Management: CRR required this visit?  No   CCM required this visit?  No     Plan:     I have personally reviewed and noted the following in the patient's chart:   Medical and social history Use of alcohol, tobacco or illicit drugs  Current medications and supplements including opioid prescriptions. Patient is not currently taking opioid prescriptions. Functional ability and status Nutritional status Physical activity Advanced directives List of other physicians Hospitalizations,  surgeries, and ER visits in previous 12 months Vitals Screenings to include cognitive, depression, and falls Referrals and appointments  In addition, I have reviewed and discussed with patient certain preventive protocols, quality metrics, and best practice recommendations. A written personalized care plan for preventive services as well as general preventive health recommendations were provided to patient.     Jaunita Messier, CMA   04/13/2024   After Visit Summary: (MyChart) Due to this being a telephonic visit, the after visit summary with patients personalized plan was offered to patient via MyChart   Notes:  6 CIT Score - 2 Placed order for DEXA UTD on Shingrix vaccines per patient Needs pneumonia vaccine Needs Tdap vaccine (pharmacy). Patient does want a Tdap. Declined Covid vaccine.

## 2024-05-05 DIAGNOSIS — K080 Exfoliation of teeth due to systemic causes: Secondary | ICD-10-CM | POA: Diagnosis not present

## 2024-05-24 DIAGNOSIS — K080 Exfoliation of teeth due to systemic causes: Secondary | ICD-10-CM | POA: Diagnosis not present

## 2024-06-02 DIAGNOSIS — K080 Exfoliation of teeth due to systemic causes: Secondary | ICD-10-CM | POA: Diagnosis not present

## 2024-06-22 DIAGNOSIS — H25013 Cortical age-related cataract, bilateral: Secondary | ICD-10-CM | POA: Diagnosis not present

## 2024-06-22 DIAGNOSIS — H2513 Age-related nuclear cataract, bilateral: Secondary | ICD-10-CM | POA: Diagnosis not present

## 2024-07-07 DIAGNOSIS — K080 Exfoliation of teeth due to systemic causes: Secondary | ICD-10-CM | POA: Diagnosis not present

## 2024-07-11 ENCOUNTER — Encounter: Payer: Self-pay | Admitting: Student

## 2024-07-11 ENCOUNTER — Ambulatory Visit (INDEPENDENT_AMBULATORY_CARE_PROVIDER_SITE_OTHER): Admitting: Student

## 2024-07-11 VITALS — BP 126/80 | HR 76 | Temp 98.6°F | Ht 65.0 in | Wt 149.1 lb

## 2024-07-11 DIAGNOSIS — R42 Dizziness and giddiness: Secondary | ICD-10-CM | POA: Diagnosis not present

## 2024-07-11 DIAGNOSIS — I10 Essential (primary) hypertension: Secondary | ICD-10-CM | POA: Diagnosis not present

## 2024-07-11 MED ORDER — MECLIZINE HCL 50 MG PO TABS
50.0000 mg | ORAL_TABLET | ORAL | 0 refills | Status: DC | PRN
Start: 2024-07-11 — End: 2024-07-12

## 2024-07-11 MED ORDER — AMLODIPINE BESYLATE 2.5 MG PO TABS: 2.5000 mg | ORAL_TABLET | Freq: Every day | ORAL | 0 refills | Status: DC

## 2024-07-11 MED ORDER — AMLODIPINE BESYLATE 5 MG PO TABS
ORAL_TABLET | ORAL | 0 refills | Status: DC
Start: 2024-07-11 — End: 2024-08-12

## 2024-07-11 NOTE — Assessment & Plan Note (Signed)
 Chronic history of this. 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 MRI from 11/25/2019 was unremarkable. Symptoms seem improved, no longer having daily symptoms. Most recent episode my be due to BPPV although dix halpike is negative today. She would like to try higher dose of meclizine . Will give her a short course. Offer vestibular rehab, she would like to see if symptoms resolve on their own as she feels much better than when symptoms started. Follow up in 2 weeks if not improving.

## 2024-07-11 NOTE — Assessment & Plan Note (Signed)
 BP well controlled on amlodipine  7.5 mg daily. She would like to avoid cutting pills as they are very small. Will send in 2.5mg  and 5 mg tablets. Continue on current dosage. Will have her follow up with me for transfer of care as Dr. Joshua recently retired.

## 2024-07-11 NOTE — Progress Notes (Signed)
 Established Patient Office Visit  Subjective   Patient ID: Cheyenne Bean, female    DOB: [birth year]  Age: 68 y.o. MRN: [id number]  Chief Complaint  Patient presents with   Dizziness    Saturday evening, consistent dizziness   Vertigo for the past 2 days. Started while throwing a birthday party after she drank a few sips of beer. Took meclizine  without much effect. Has history of vertigo for many years,  saw ENT and was told she had vertigo does not recall having much treatment for this. Has similar episodes of vertigo every 6 months or so that last 3-4 days. Feels symptoms are gradually improving on their own. Bending and moving head make symptoms worse. Symptoms are similar to prior episodes of vertigo. Does have tinnitus chronically. Denies weakness, HA, visual changes, falls, LOC, or changes in sensation.    Patient Active Problem List   Diagnosis Date Noted   Vertigo 07/11/2024   Osteoarthritis of right knee 03/18/2024   Pure hypercholesterolemia 11/04/2016   Essential hypertension 09/18/2015   Hyperlipidemia 09/18/2015   Recurrent genital herpes simplex 09/18/2015      ROS Refer to HPI    Objective:     BP 126/80   Pulse 76   Temp 98.6 F (37 C) (Oral)   Ht 5' 5 (1.651 m)   Wt 149 lb 2 oz (67.6 kg)   SpO2 96%   BMI 24.82 kg/m  BP Readings from Last 3 Encounters:  07/11/24 126/80  04/12/24 (!) 140/90  03/18/24 130/86    Physical Exam Constitutional:      Appearance: Normal appearance.  HENT:     Head: Normocephalic and atraumatic.     Right Ear: Tympanic membrane normal.     Left Ear: Tympanic membrane normal.  Eyes:     Extraocular Movements: Extraocular movements intact.     Right eye: No nystagmus.     Left eye: No nystagmus.     Comments: Negative head impulse, negative test of skew. No nystagmus with dix Hallpike   Cardiovascular:     Rate and Rhythm: Normal rate and regular rhythm.  Pulmonary:     Effort: Pulmonary effort is normal.      Breath sounds: No rhonchi or rales.  Abdominal:     General: Abdomen is flat. Bowel sounds are normal. There is distension.     Palpations: Abdomen is soft.     Tenderness: There is no abdominal tenderness.  Musculoskeletal:        General: Normal range of motion.     Right lower leg: No edema.     Left lower leg: No edema.  Skin:    General: Skin is warm and dry.     Capillary Refill: Capillary refill takes less than 2 seconds.  Neurological:     General: No focal deficit present.     Mental Status: She is alert and oriented to person, place, and time.  Psychiatric:        Mood and Affect: Mood normal.        Behavior: Behavior normal.        04/13/2024    8:26 AM 03/17/2024    3:06 PM 11/24/2023    8:30 AM  Depression screen PHQ 2/9  Decreased Interest 0 0 0  Down, Depressed, Hopeless 0 0 0  PHQ - 2 Score 0 0 0  Altered sleeping 0  0  Tired, decreased energy 0  0  Change in appetite 0  0  Feeling bad or failure about yourself  0  0  Trouble concentrating 0  0  Moving slowly or fidgety/restless 0  0  Suicidal thoughts 0  0  PHQ-9 Score 0  0  Difficult doing work/chores Not difficult at all  Not difficult at all       03/17/2024    3:06 PM 11/24/2023    8:31 AM 07/02/2023   10:26 AM 06/15/2023   10:49 AM  GAD 7 : Generalized Anxiety Score  Nervous, Anxious, on Edge 0 0 0 0  Control/stop worrying 1 0 0 0  Worry too much - different things 1 0 0 0  Trouble relaxing 0 0 0 0  Restless 0 0 0 0  Easily annoyed or irritable 1 0 0 0  Afraid - awful might happen 0 0 0 0  Total GAD 7 Score 3 0 0 0  Anxiety Difficulty  Not difficult at all Not difficult at all Not difficult at all    No results found for any visits on 07/11/24.  Last CBC Lab Results  Component Value Date   WBC 5.2 12/27/2021   HGB 14.0 12/27/2021   HCT 41.3 12/27/2021   MCV 87 12/27/2021   MCH 29.4 12/27/2021   RDW 13.1 12/27/2021   PLT 236 12/27/2021   Last metabolic panel Lab Results   Component Value Date   GLUCOSE 95 07/02/2023   NA 143 07/02/2023   K 4.6 07/02/2023   CL 106 07/02/2023   CO2 23 07/02/2023   BUN 16 07/02/2023   CREATININE 0.73 07/02/2023   EGFR 92 07/02/2023   CALCIUM 9.7 07/02/2023   PHOS 4.1 12/29/2022   PROT 7.7 07/02/2023   ALBUMIN 4.6 07/02/2023   LABGLOB 3.1 07/02/2023   AGRATIO 1.5 10/14/2021   BILITOT 0.4 07/02/2023   ALKPHOS 124 (H) 07/02/2023   AST 20 07/02/2023   ALT 22 07/02/2023   ANIONGAP 7 12/13/2020      The 10-year ASCVD risk score (Arnett DK, et al., 2019) is: 7.4%    Assessment & Plan:  Vertigo Assessment & Plan: Chronic history of this. Saw ENT in 2021 for this VNG noted to the  L [facility] 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 MRI from 11/25/2019 was unremarkable. Symptoms seem improved, no longer having daily symptoms. Most recent episode my be due to BPPV although dix halpike is negative today. She would like to try higher dose of meclizine . Will give her a short course. Offer vestibular rehab, she would like to see if symptoms resolve on their own as she feels much better than when symptoms started. Follow up in 2 weeks if not improving.     Orders: -     Meclizine  HCl; Take 1 tablet (50 mg total) by mouth as needed for dizziness.  Dispense: 7 tablet; Refill: 0  Dizziness  Essential hypertension Assessment & Plan: BP well controlled on amlodipine  7.5 mg daily. She would like to avoid cutting pills as they are very small. Will send in 2.5mg  and 5 mg tablets. Continue on current dosage. Will have her follow up with me for transfer of care as Dr. Joshua recently retired.   Orders: -     amLODIPine  Besylate; TAKE 1 & 1/2 (ONE & ONE-HALF) TABLETS BY MOUTH ONCE DAILY WILL  NEED  TO  RECHECK  BP  IN  THE  NEXT  2  MONTHS  Dispense: 30 tablet; Refill: 0  Other orders -     amLODIPine  Besylate; Take 1 tablet (2.5 mg total) by mouth  daily.  Dispense: 30 tablet; Refill: 0     Return in about 4 weeks (around 08/08/2024) for TOC.    Harlene Saddler, MD

## 2024-07-12 ENCOUNTER — Telehealth: Payer: Self-pay

## 2024-07-12 ENCOUNTER — Other Ambulatory Visit: Payer: Self-pay | Admitting: Student

## 2024-07-12 DIAGNOSIS — I10 Essential (primary) hypertension: Secondary | ICD-10-CM

## 2024-07-12 DIAGNOSIS — R42 Dizziness and giddiness: Secondary | ICD-10-CM

## 2024-07-12 MED ORDER — MECLIZINE HCL 25 MG PO TABS
25.0000 mg | ORAL_TABLET | ORAL | 0 refills | Status: AC | PRN
Start: 2024-07-12 — End: 2024-07-19

## 2024-07-12 NOTE — Telephone Encounter (Signed)
 Copied from CRM [id number]. Topic: Clinical - Prescription Issue >> Jul 12, 2024 10:57 AM Cheyenne Bean wrote: Reason for CRM: Patient states needs updated rx's sent to pharmacy.   - amLODipine  (NORVASC ) 2.5 MG tablet - amLODipine  (NORVASC ) 5 MG tablet  **For amlodipine  states should've been a 60 day supply to get her to next appointment. Advised can always call back in when she gets close to running out and we can put in refill request but the patient still wanted me to let the provider know.   - meclizine  (ANTIVERT ) 50 MG tablet (wanting to go back to the 25mg  twice a day because insurance is charging her more due to the higher dosage, apologizes for the inconvenience) **Meclizine  was not picked up at all due to copay so she currently doesn't have any.    [id number] - [address] [place] - [address]

## 2024-07-19 ENCOUNTER — Other Ambulatory Visit: Payer: Self-pay | Admitting: Student

## 2024-07-19 DIAGNOSIS — E7800 Pure hypercholesterolemia, unspecified: Secondary | ICD-10-CM

## 2024-07-19 NOTE — Telephone Encounter (Unsigned)
 Copied from CRM [id number]. Topic: Clinical - Medication Refill >> Jul 19, 2024 10:30 AM Charlet HERO wrote: Medication: pravastatin  (PRAVACHOL ) 20 MG tablet  Has the patient contacted their pharmacy? Yes Pharmacy is the one ordering refill  This is the patient's preferred pharmacy:  [facility] [address] [place] - [address] [place] [address] Phone: [phone] Fax: [phone]  Is this the correct pharmacy for this prescription? Yes If no, delete pharmacy and type the correct one.   Has the prescription been filled recently? Yes  Is the patient out of the medication? Yes  Has the patient been seen for an appointment in the last year OR does the patient have an upcoming appointment? Yes  Can we respond through MyChart? No  Agent: Please be advised that Rx refills may take up to 3 business days. We ask that you follow-up with your pharmacy.

## 2024-07-20 MED ORDER — PRAVASTATIN SODIUM 20 MG PO TABS: 20.0000 mg | ORAL_TABLET | Freq: Every day | ORAL | 0 refills | Status: DC

## 2024-07-20 NOTE — Telephone Encounter (Signed)
 Requested Prescriptions  Pending Prescriptions Disp Refills   pravastatin  (PRAVACHOL ) 20 MG tablet 90 tablet 0    Sig: Take 1 tablet (20 mg total) by mouth daily.     Cardiovascular:  Antilipid - Statins Failed - 07/20/2024 12:15 PM      Failed - Lipid Panel in normal range within the last 12 months    Cholesterol, Total  Date Value Ref Range Status  07/02/2023 164 100 - 199 mg/dL Final   LDL Chol Calc (NIH)  Date Value Ref Range Status  07/02/2023 100 (H) 0 - 99 mg/dL Final   HDL  Date Value Ref Range Status  07/02/2023 54 >39 mg/dL Final   Triglycerides  Date Value Ref Range Status  07/02/2023 49 0 - 149 mg/dL Final         Passed - Patient is not pregnant      Passed - Valid encounter within last 12 months    Recent Outpatient Visits           1 week ago Vertigo   [place] [facility] at [facility], Harlene, MD   3 months ago Osteoarthritis of right knee, unspecified osteoarthritis type   [place] [facility], Selinda PARAS, MD   4 months ago Osteoarthritis of right knee, unspecified osteoarthritis type   [facility] at [facility], Selinda PARAS, MD   4 months ago Popliteal bursitis of right knee   [facility] at [facility] Cathryne JAYSON, MD

## 2024-07-29 ENCOUNTER — Other Ambulatory Visit: Payer: Self-pay | Admitting: Student

## 2024-08-02 NOTE — Telephone Encounter (Signed)
 Requested medication (s) are due for refill today:   Yes Only #30 prescribed.  0 refills  Requested medication (s) are on the active medication list:   Yes  Future visit scheduled:   Yes 8/20 with Dr. Lemon   Last ordered: 07/11/2024 #30, 0 refills  Dose was changed.   For f/u on 8/20.    Only #30 prescribed.   Provider to review     Requested Prescriptions  Pending Prescriptions Disp Refills   amLODipine  (NORVASC ) 2.5 MG tablet [Pharmacy Med Name: amLODIPine  Besylate 2.5 MG Oral Tablet] 30 tablet 0    Sig: Take 1 tablet by mouth once daily     Cardiovascular: Calcium Channel Blockers 2 Passed - 08/02/2024 12:55 PM      Passed - Last BP in normal range    BP Readings from Last 1 Encounters:  07/11/24 126/80         Passed - Last Heart Rate in normal range    Pulse Readings from Last 1 Encounters:  07/11/24 76         Passed - Valid encounter within last 6 months    Recent Outpatient Visits           3 weeks ago Vertigo   [place] [facility] at [facility], Harlene, MD   3 months ago Osteoarthritis of right knee, unspecified osteoarthritis type    [facility] at [facility], Selinda PARAS, MD   4 months ago Osteoarthritis of right knee, unspecified osteoarthritis type   [facility] at [facility], Selinda PARAS, MD   4 months ago Popliteal bursitis of right knee   [facility] at [facility] Cathryne JAYSON, MD

## 2024-08-02 NOTE — Telephone Encounter (Signed)
 Please review medication refill request

## 2024-08-10 ENCOUNTER — Encounter: Admitting: Student

## 2024-08-12 ENCOUNTER — Other Ambulatory Visit: Payer: Self-pay | Admitting: Student

## 2024-08-12 ENCOUNTER — Telehealth: Payer: Self-pay | Admitting: Student

## 2024-08-12 DIAGNOSIS — I10 Essential (primary) hypertension: Secondary | ICD-10-CM

## 2024-08-12 DIAGNOSIS — A6000 Herpesviral infection of urogenital system, unspecified: Secondary | ICD-10-CM

## 2024-08-12 MED ORDER — AMLODIPINE BESYLATE 5 MG PO TABS: 5.0000 mg | ORAL_TABLET | Freq: Every day | ORAL | 1 refills | Status: AC

## 2024-08-12 MED ORDER — VALACYCLOVIR HCL 500 MG PO TABS: 500.0000 mg | ORAL_TABLET | Freq: Every day | ORAL | 0 refills | Status: DC

## 2024-08-12 NOTE — Telephone Encounter (Signed)
 Please review, there is two scripts for amlodipine 

## 2024-08-12 NOTE — Telephone Encounter (Signed)
 Patient need refills till her appointment in October.  Amlodipine ,and Valacyclovir  patient would also need the nurse to contact her about the amlodipine  on how she should be taking it, she mentioned the pharmacy was confused about how it should be dispensed.

## 2024-08-15 NOTE — Telephone Encounter (Signed)
 Attempted to contact patient to discuss amlodipine  refill, there was no answer. VM message box is full, unable to leave voicemail message.

## 2024-08-16 NOTE — Telephone Encounter (Signed)
 Spoke with patient, discussed her refills and Amlodipine  medication. Patient verbalized understanding and denied any questions.

## 2024-08-18 DIAGNOSIS — K080 Exfoliation of teeth due to systemic causes: Secondary | ICD-10-CM | POA: Diagnosis not present

## 2024-08-25 ENCOUNTER — Encounter: Payer: Self-pay | Admitting: Student

## 2024-09-21 ENCOUNTER — Encounter: Admitting: Student

## 2024-10-05 DIAGNOSIS — R590 Localized enlarged lymph nodes: Secondary | ICD-10-CM | POA: Diagnosis not present

## 2024-10-05 DIAGNOSIS — I10 Essential (primary) hypertension: Secondary | ICD-10-CM | POA: Diagnosis not present

## 2024-10-05 DIAGNOSIS — Z0000 Encounter for general adult medical examination without abnormal findings: Secondary | ICD-10-CM | POA: Diagnosis not present

## 2024-10-05 DIAGNOSIS — E7800 Pure hypercholesterolemia, unspecified: Secondary | ICD-10-CM | POA: Diagnosis not present

## 2024-10-05 DIAGNOSIS — Z1331 Encounter for screening for depression: Secondary | ICD-10-CM | POA: Diagnosis not present

## 2024-10-10 ENCOUNTER — Other Ambulatory Visit: Payer: Self-pay | Admitting: Internal Medicine

## 2024-10-10 DIAGNOSIS — R590 Localized enlarged lymph nodes: Secondary | ICD-10-CM

## 2024-10-12 ENCOUNTER — Other Ambulatory Visit: Payer: Self-pay | Admitting: Student

## 2024-10-12 DIAGNOSIS — E7800 Pure hypercholesterolemia, unspecified: Secondary | ICD-10-CM

## 2024-10-14 DIAGNOSIS — E7800 Pure hypercholesterolemia, unspecified: Secondary | ICD-10-CM | POA: Diagnosis not present

## 2024-10-14 DIAGNOSIS — Z23 Encounter for immunization: Secondary | ICD-10-CM | POA: Diagnosis not present

## 2024-10-14 DIAGNOSIS — Z79899 Other long term (current) drug therapy: Secondary | ICD-10-CM | POA: Diagnosis not present

## 2024-10-14 DIAGNOSIS — I10 Essential (primary) hypertension: Secondary | ICD-10-CM | POA: Diagnosis not present

## 2024-10-14 DIAGNOSIS — Z1321 Encounter for screening for nutritional disorder: Secondary | ICD-10-CM | POA: Diagnosis not present

## 2024-10-14 DIAGNOSIS — R7309 Other abnormal glucose: Secondary | ICD-10-CM | POA: Diagnosis not present

## 2024-10-14 NOTE — Telephone Encounter (Signed)
 Requested medications are due for refill today.  yes  Requested medications are on the active medications list.  yes  Last refill. 07/20/2024 #90 0 rf  Future visit scheduled.   no  Notes to clinic.  Labs are expired.    Requested Prescriptions  Pending Prescriptions Disp Refills   pravastatin  (PRAVACHOL ) 20 MG tablet [Pharmacy Med Name: PRAVASTATIN  20MG     TAB] 90 tablet 0    Sig: Take 1 tablet by mouth once daily     Cardiovascular:  Antilipid - Statins Failed - 10/14/2024  9:48 AM      Failed - Lipid Panel in normal range within the last 12 months    Cholesterol, Total  Date Value Ref Range Status  07/02/2023 164 100 - 199 mg/dL Final   LDL Chol Calc (NIH)  Date Value Ref Range Status  07/02/2023 100 (H) 0 - 99 mg/dL Final   HDL  Date Value Ref Range Status  07/02/2023 54 >39 mg/dL Final   Triglycerides  Date Value Ref Range Status  07/02/2023 49 0 - 149 mg/dL Final         Passed - Patient is not pregnant      Passed - Valid encounter within last 12 months    Recent Outpatient Visits           3 months ago Vertigo   [place] [facility] at [facility], Harlene, MD   6 months ago Osteoarthritis of right knee, unspecified osteoarthritis type   [place] [facility], Selinda PARAS, MD   7 months ago Osteoarthritis of right knee, unspecified osteoarthritis type   [facility] at [facility], Selinda PARAS, MD   7 months ago Popliteal bursitis of right knee   [facility] at [facility] Cathryne JAYSON, MD

## 2024-10-20 ENCOUNTER — Ambulatory Visit
Admission: RE | Admit: 2024-10-20 | Discharge: 2024-10-20 | Disposition: A | Discharge status: Home / Self Care | Source: Ambulatory Visit | Attending: Internal Medicine | Admitting: Internal Medicine

## 2024-10-20 DIAGNOSIS — R590 Localized enlarged lymph nodes: Secondary | ICD-10-CM

## 2024-12-27 ENCOUNTER — Other Ambulatory Visit: Payer: Self-pay

## 2024-12-27 DIAGNOSIS — A6000 Herpesviral infection of urogenital system, unspecified: Secondary | ICD-10-CM

## 2024-12-27 MED ORDER — VALACYCLOVIR HCL 500 MG PO TABS: 500.0000 mg | ORAL_TABLET | Freq: Every day | ORAL | 0 refills | Status: AC

## 2025-04-19 ENCOUNTER — Ambulatory Visit
